# Patient Record
Sex: Female | Born: 1935 | Race: White | Hispanic: No | Smoking: Never smoker
Health system: Southern US, Community
[De-identification: ages and names within clinical notes are randomized; demographics above are authoritative.]

## PROBLEM LIST (undated history)

## (undated) DIAGNOSIS — I5032 Chronic diastolic (congestive) heart failure: Secondary | ICD-10-CM

## (undated) DIAGNOSIS — N184 Chronic kidney disease, stage 4 (severe): Secondary | ICD-10-CM

## (undated) DIAGNOSIS — J189 Pneumonia, unspecified organism: Secondary | ICD-10-CM

## (undated) DIAGNOSIS — J9600 Acute respiratory failure, unspecified whether with hypoxia or hypercapnia: Secondary | ICD-10-CM

## (undated) DIAGNOSIS — E119 Type 2 diabetes mellitus without complications: Secondary | ICD-10-CM

## (undated) DIAGNOSIS — I10 Essential (primary) hypertension: Secondary | ICD-10-CM

## (undated) DIAGNOSIS — F0390 Unspecified dementia without behavioral disturbance: Secondary | ICD-10-CM

## (undated) HISTORY — PX: THYROID SURGERY: SHX805

## (undated) HISTORY — PX: CATARACT EXTRACTION: SUR2

---

## 2003-09-04 ENCOUNTER — Other Ambulatory Visit: Payer: Self-pay

## 2004-08-07 ENCOUNTER — Ambulatory Visit: Payer: Self-pay | Admitting: Family Medicine

## 2005-12-17 ENCOUNTER — Ambulatory Visit: Payer: Self-pay | Admitting: Specialist

## 2008-05-24 ENCOUNTER — Emergency Department: Payer: Self-pay | Admitting: Emergency Medicine

## 2011-04-08 ENCOUNTER — Inpatient Hospital Stay: Payer: Self-pay | Admitting: Internal Medicine

## 2011-04-08 LAB — COMPREHENSIVE METABOLIC PANEL
Anion Gap: 15 (ref 7–16)
BUN: 43 mg/dL — ABNORMAL HIGH (ref 7–18)
Bilirubin,Total: 0.5 mg/dL (ref 0.2–1.0)
Calcium, Total: 8.6 mg/dL (ref 8.5–10.1)
Chloride: 97 mmol/L — ABNORMAL LOW (ref 98–107)
Co2: 24 mmol/L (ref 21–32)
Creatinine: 1.73 mg/dL — ABNORMAL HIGH (ref 0.60–1.30)
EGFR (African American): 36 — ABNORMAL LOW
EGFR (Non-African Amer.): 30 — ABNORMAL LOW
Potassium: 3.6 mmol/L (ref 3.5–5.1)
SGOT(AST): 13 U/L — ABNORMAL LOW (ref 15–37)
SGPT (ALT): 10 U/L — ABNORMAL LOW
Sodium: 136 mmol/L (ref 136–145)

## 2011-04-08 LAB — CBC
HCT: 33.3 % — ABNORMAL LOW (ref 35.0–47.0)
HGB: 11.4 g/dL — ABNORMAL LOW (ref 12.0–16.0)
MCH: 30.9 pg (ref 26.0–34.0)
MCV: 90 fL (ref 80–100)
RBC: 3.69 10*6/uL — ABNORMAL LOW (ref 3.80–5.20)

## 2011-04-08 LAB — URINALYSIS, COMPLETE
Glucose,UR: >=500 mg/dL (ref 0–75)
Hyaline Cast: 4
Nitrite: NEGATIVE
Ph: 5 (ref 4.5–8.0)
Protein: 100
RBC,UR: 1 /HPF (ref 0–5)
Specific Gravity: 1.013 (ref 1.003–1.030)
Squamous Epithelial: 1

## 2011-04-08 LAB — CK TOTAL AND CKMB (NOT AT ARMC): CK-MB: 1.4 ng/mL (ref 0.5–3.6)

## 2011-04-08 LAB — TROPONIN I: Troponin-I: <0.02 ng/mL

## 2011-04-08 LAB — TSH: Thyroid Stimulating Horm: 2.16 u[IU]/mL

## 2011-04-09 LAB — CBC WITH DIFFERENTIAL/PLATELET
Basophil %: 0 %
Eosinophil #: 0 10*3/uL (ref 0.0–0.7)
Eosinophil %: 0.1 %
HGB: 10.4 g/dL — ABNORMAL LOW (ref 12.0–16.0)
Lymphocyte %: 3.3 %
MCHC: 33.3 g/dL (ref 32.0–36.0)
Monocyte #: 0.9 10*3/uL — ABNORMAL HIGH (ref 0.0–0.7)
Neutrophil #: 16 10*3/uL — ABNORMAL HIGH (ref 1.4–6.5)
Neutrophil %: 91.6 %
Platelet: 180 10*3/uL (ref 150–440)
RBC: 3.43 10*6/uL — ABNORMAL LOW (ref 3.80–5.20)
WBC: 17.4 10*3/uL — ABNORMAL HIGH (ref 3.6–11.0)

## 2011-04-09 LAB — COMPREHENSIVE METABOLIC PANEL
Albumin: 1.7 g/dL — ABNORMAL LOW (ref 3.4–5.0)
Alkaline Phosphatase: 39 U/L — ABNORMAL LOW (ref 50–136)
Anion Gap: 13 (ref 7–16)
BUN: 35 mg/dL — ABNORMAL HIGH (ref 7–18)
Bilirubin,Total: 0.3 mg/dL (ref 0.2–1.0)
Creatinine: 1.3 mg/dL (ref 0.60–1.30)
Glucose: 194 mg/dL — ABNORMAL HIGH (ref 65–99)
Potassium: 3.1 mmol/L — ABNORMAL LOW (ref 3.5–5.1)
SGOT(AST): 11 U/L — ABNORMAL LOW (ref 15–37)
Sodium: 142 mmol/L (ref 136–145)
Total Protein: 6.1 g/dL — ABNORMAL LOW (ref 6.4–8.2)

## 2011-04-09 LAB — MAGNESIUM: Magnesium: 2 mg/dL

## 2011-04-10 LAB — BASIC METABOLIC PANEL
Anion Gap: 11 (ref 7–16)
Calcium, Total: 7.9 mg/dL — ABNORMAL LOW (ref 8.5–10.1)
Chloride: 105 mmol/L (ref 98–107)
Co2: 24 mmol/L (ref 21–32)
EGFR (African American): >60
EGFR (Non-African Amer.): 51 — ABNORMAL LOW
Glucose: 113 mg/dL — ABNORMAL HIGH (ref 65–99)
Osmolality: 285 (ref 275–301)
Sodium: 140 mmol/L (ref 136–145)

## 2011-04-10 LAB — CBC WITH DIFFERENTIAL/PLATELET
Eosinophil %: 0.5 %
HCT: 27.9 % — ABNORMAL LOW (ref 35.0–47.0)
Lymphocyte #: 0.8 10*3/uL — ABNORMAL LOW (ref 1.0–3.6)
MCH: 30.1 pg (ref 26.0–34.0)
MCHC: 32.9 g/dL (ref 32.0–36.0)
MCV: 92 fL (ref 80–100)
Monocyte %: 5.2 %
Neutrophil #: 16.4 10*3/uL — ABNORMAL HIGH (ref 1.4–6.5)
Platelet: 185 10*3/uL (ref 150–440)
RBC: 3.05 10*6/uL — ABNORMAL LOW (ref 3.80–5.20)
WBC: 18.2 10*3/uL — ABNORMAL HIGH (ref 3.6–11.0)

## 2011-04-10 LAB — URINE CULTURE

## 2011-04-11 LAB — BASIC METABOLIC PANEL
Anion Gap: 12 (ref 7–16)
BUN: 31 mg/dL — ABNORMAL HIGH (ref 7–18)
Creatinine: 1.38 mg/dL — ABNORMAL HIGH (ref 0.60–1.30)
EGFR (African American): 47 — ABNORMAL LOW
EGFR (Non-African Amer.): 39 — ABNORMAL LOW
Potassium: 4.3 mmol/L (ref 3.5–5.1)
Sodium: 139 mmol/L (ref 136–145)

## 2011-04-11 LAB — CBC WITH DIFFERENTIAL/PLATELET
Basophil #: 0 10*3/uL (ref 0.0–0.1)
Eosinophil %: 0 %
HGB: 10.1 g/dL — ABNORMAL LOW (ref 12.0–16.0)
Lymphocyte #: 0.4 10*3/uL — ABNORMAL LOW (ref 1.0–3.6)
MCH: 30.1 pg (ref 26.0–34.0)
MCHC: 33 g/dL (ref 32.0–36.0)
MCV: 91 fL (ref 80–100)
Monocyte #: 0.2 10*3/uL (ref 0.0–0.7)
Monocyte %: 1.2 %
Neutrophil #: 14 10*3/uL — ABNORMAL HIGH (ref 1.4–6.5)
Platelet: 208 10*3/uL (ref 150–440)
RBC: 3.36 10*6/uL — ABNORMAL LOW (ref 3.80–5.20)
RDW: 13.4 % (ref 11.5–14.5)

## 2011-04-12 LAB — CBC WITH DIFFERENTIAL/PLATELET
Basophil #: 0 10*3/uL (ref 0.0–0.1)
Eosinophil #: 0 10*3/uL (ref 0.0–0.7)
HCT: 30.9 % — ABNORMAL LOW (ref 35.0–47.0)
HGB: 10.3 g/dL — ABNORMAL LOW (ref 12.0–16.0)
Lymphocyte %: 3.2 %
MCV: 91 fL (ref 80–100)
Monocyte #: 0.4 10*3/uL (ref 0.0–0.7)
Platelet: 238 10*3/uL (ref 150–440)
RBC: 3.39 10*6/uL — ABNORMAL LOW (ref 3.80–5.20)
RDW: 13.6 % (ref 11.5–14.5)
WBC: 21.3 10*3/uL — ABNORMAL HIGH (ref 3.6–11.0)

## 2011-04-12 LAB — BASIC METABOLIC PANEL
BUN: 31 mg/dL — ABNORMAL HIGH (ref 7–18)
Calcium, Total: 8.5 mg/dL (ref 8.5–10.1)
Chloride: 112 mmol/L — ABNORMAL HIGH (ref 98–107)
Co2: 20 mmol/L — ABNORMAL LOW (ref 21–32)
Creatinine: 1.06 mg/dL (ref 0.60–1.30)
EGFR (African American): >60
Glucose: 196 mg/dL — ABNORMAL HIGH (ref 65–99)
Osmolality: 299 (ref 275–301)

## 2011-04-12 LAB — EXPECTORATED SPUTUM ASSESSMENT W GRAM STAIN, RFLX TO RESP C

## 2011-04-13 LAB — BASIC METABOLIC PANEL
Anion Gap: 12 (ref 7–16)
Calcium, Total: 8.5 mg/dL (ref 8.5–10.1)
EGFR (African American): >60
EGFR (Non-African Amer.): 52 — ABNORMAL LOW
Glucose: 343 mg/dL — ABNORMAL HIGH (ref 65–99)
Osmolality: 300 (ref 275–301)
Potassium: 4.2 mmol/L (ref 3.5–5.1)
Sodium: 140 mmol/L (ref 136–145)

## 2011-04-13 LAB — CBC WITH DIFFERENTIAL/PLATELET
Basophil %: 0 %
Eosinophil %: 0 %
HGB: 10.5 g/dL — ABNORMAL LOW (ref 12.0–16.0)
Lymphocyte #: 0.6 10*3/uL — ABNORMAL LOW (ref 1.0–3.6)
MCH: 29.6 pg (ref 26.0–34.0)
MCV: 91 fL (ref 80–100)
Monocyte #: 0.1 10*3/uL (ref 0.0–0.7)
RBC: 3.54 10*6/uL — ABNORMAL LOW (ref 3.80–5.20)
RDW: 13.7 % (ref 11.5–14.5)

## 2011-04-13 LAB — OCCULT BLOOD X 1 CARD TO LAB, STOOL: Occult Blood, Feces: NEGATIVE

## 2011-04-13 LAB — CULTURE, BLOOD (SINGLE)

## 2011-04-14 LAB — CBC WITH DIFFERENTIAL/PLATELET
Basophil #: 0 10*3/uL (ref 0.0–0.1)
Basophil %: 0.1 %
Eosinophil #: 0 10*3/uL (ref 0.0–0.7)
Lymphocyte #: 0.8 10*3/uL — ABNORMAL LOW (ref 1.0–3.6)
MCH: 29.9 pg (ref 26.0–34.0)
MCHC: 33 g/dL (ref 32.0–36.0)
MCV: 91 fL (ref 80–100)
Monocyte #: 0.5 10*3/uL (ref 0.0–0.7)
Neutrophil %: 92.6 %
Platelet: 261 10*3/uL (ref 150–440)
RBC: 3.6 10*6/uL — ABNORMAL LOW (ref 3.80–5.20)

## 2011-04-14 LAB — BASIC METABOLIC PANEL
BUN: 32 mg/dL — ABNORMAL HIGH (ref 7–18)
Calcium, Total: 8.2 mg/dL — ABNORMAL LOW (ref 8.5–10.1)
Chloride: 109 mmol/L — ABNORMAL HIGH (ref 98–107)
Co2: 24 mmol/L (ref 21–32)
Creatinine: 0.91 mg/dL (ref 0.60–1.30)
EGFR (Non-African Amer.): >60
Glucose: 243 mg/dL — ABNORMAL HIGH (ref 65–99)
Potassium: 4 mmol/L (ref 3.5–5.1)
Sodium: 143 mmol/L (ref 136–145)

## 2011-04-15 ENCOUNTER — Encounter: Payer: Self-pay | Admitting: Internal Medicine

## 2011-04-29 LAB — COMPREHENSIVE METABOLIC PANEL
Albumin: 1.7 g/dL — ABNORMAL LOW (ref 3.4–5.0)
Alkaline Phosphatase: 35 U/L — ABNORMAL LOW (ref 50–136)
BUN: 22 mg/dL — ABNORMAL HIGH (ref 7–18)
Glucose: 77 mg/dL (ref 65–99)
Potassium: 3.8 mmol/L (ref 3.5–5.1)
SGOT(AST): 17 U/L (ref 15–37)
SGPT (ALT): 19 U/L
Total Protein: 5.3 g/dL — ABNORMAL LOW (ref 6.4–8.2)

## 2011-05-09 ENCOUNTER — Encounter: Payer: Self-pay | Admitting: Internal Medicine

## 2011-06-09 ENCOUNTER — Encounter: Payer: Self-pay | Admitting: Internal Medicine

## 2011-07-09 ENCOUNTER — Encounter: Payer: Self-pay | Admitting: Internal Medicine

## 2013-05-21 ENCOUNTER — Emergency Department: Payer: Self-pay | Admitting: Emergency Medicine

## 2013-05-21 LAB — URINALYSIS, COMPLETE
Bilirubin,UR: NEGATIVE
Glucose,UR: 150 mg/dL (ref 0–75)
LEUKOCYTE ESTERASE: NEGATIVE
Nitrite: NEGATIVE
Ph: 5 (ref 4.5–8.0)
Protein: 100
SPECIFIC GRAVITY: 1.012 (ref 1.003–1.030)
WBC UR: 18 /HPF (ref 0–5)

## 2013-08-25 ENCOUNTER — Ambulatory Visit: Payer: Self-pay | Admitting: Family Medicine

## 2014-07-02 NOTE — H&P (Signed)
PATIENT NAME:  Bethany Nelson, Bethany Nelson MR#:  [id number] DATE OF BIRTH:  [birth year]  DATE OF ADMISSION:  04/08/2011  PRIMARY CARE PHYSICIAN: [name], MD  HISTORY OF PRESENT ILLNESS: The patient is an 81 year old Caucasian female with past medical history significant for history of community-acquired pneumonia diagnosed in 2001 with admission for the same, history of diabetes, hypertension, and hyperlipidemia who presented to the hospital with complaints of congestion as well as fevers for the past one week. According to patient's family, as the patient is not able to provide much history, the patient has been coughing for approximately one week now and progressively getting worse. The patient's daughter wanted to take her to a physician, however, the patient refused to go to her primary care physician. She has been having fevers anywhere from 99.2 to 101.6 since Friday, which is four days ago. She has been weak and not able to walk. Her knees buckle under her. She was brought to the emergency room because of significant and worsening weakness. In the emergency room, unfortunately, she is very weak, she is somewhat confused, and not able to remember much things and not able to provide much history. She admits of having some sputum production of yellowish-greenish phlegm over the past few days. Her oxygen saturation was noted to be 84% on room air and she requires 100% FiO2 now through nonrebreather to help her to reasonably maintain her oxygen saturation of 96%.   PAST MEDICAL HISTORY:  1. History of admission for community-acquired pneumonia in November 2001. 2. History of diabetes mellitus. 3. Hypertension, poorly controlled. 4. Medical noncompliance.  5. History of hyperlipidemia.  6. Lumbago. 7. History of hypothyroidism.  8. History of cerebrovascular accident. 9. Arthritis. 10. Chickenpox. 11. Benign dermoid cyst of left ovary and some type of cyst on the right ovary approximately four years ago.  Dermoid cyst was 40 years ago. 12. Benign thyroid growth, removed 40 years ago.  13. Depression. 14. Mild stroke, which happened three years ago.   MEDICATIONS: According to Dr. Burnett ShengHedrick. 1. HCTZ 25 mg p.o. daily.  2. Metoprolol 25 mg twice daily.  3. Diovan 320 mg p.o. daily.  4. Lexapro 10 mg p.o. daily.  5. Simvastatin 40 mg p.o. daily.  6. Metformin 1 gram twice daily. 7. Amlodipine 5 mg p.o. daily.  8. Glipizide extended-release 5 mg p.o. daily. 9. Onglyza 5 mg p.o. daily.  10. Contour test strips once or twice a day. 11. Meloxicam 7.5 mg p.o. daily as needed. 12. Levothyroxine 50 mcg p.o. daily.  ALLERGIES: No known drug allergies.   HEALTH MAINTENANCE ISSUES: The patient is not sure about her tetanus vaccine. She tries to exercise by walking.   SOCIAL HISTORY: She is widowed and has two adult children. She is retired and lives at home.   HABITS:  No alcohol or current tobacco abuse.  No illicit drugs.   OB/GYN HISTORY: G2, P2. Last menstrual period was 22 years ago, according to Dr. Burnett ShengHedrick. She has never had a mammogram. No abnormal Pap smear in the past.   FAMILY HISTORY: The patient's uncle with diabetes. The patient's mother had lung as well as esophageal cancer.   REVIEW OF SYSTEMS: Difficult to obtain as the patient is weak and not willing to cooperate much. She admits of fatigue and weakness, admits of very poor appetite, admits of having some nausea intermittently, cough with some phlegm production, yellowish and greenish sputum, some sneezing, nasal congestion, and episode of diarrhea for which she took Imodium  a few days ago, Saturday night, which was three days ago. Also very strong odor of the patient's urine. CONSTITUTIONAL: Otherwise, she denies any pain, weight loss or gain. EYES: Denies blurry vision or double vision, glaucoma, or cataracts. ENT: Denies any tinnitus, allergies, epistaxis, sinus pain, dentures, or difficulty swallowing. RESPIRATORY: Denies any  wheezes, asthma, or chronic obstructive pulmonary disease. CARDIOVASCULAR: Denies any chest pain, edema, arrhythmias, palpitations, or syncope. GASTROINTESTINAL: Denies any vomiting, hematemesis, rectal bleeding, or change in bowel habits. GENITOURINARY: Denies dysuria, hematuria, frequency, or incontinence. ENDOCRINOLOGY: Denies any polydipsia or nocturia. Admits of thyroid problems. HEMATOLOGIC:  No anemia, easy bruising, bleeding, or swollen glands. SKIN: Denies any acne lesions, lesions, or change in moles. MUSCULOSKELETAL: Denies  arthritis, cramps, swelling, or gout. Admits of having some back pain intermittently. NEUROLOGIC: Denies any numbness, epilepsy, or tremor. PSYCHIATRIC: Denies insomnia.  PHYSICAL EXAMINATION:  VITALS: On arrival to the hospital, the patient's temperature in triage was 98.2, pulse 66, respirations 22, blood pressure 146/76, and saturation 84% on room air.   GENERAL: A well-developed, well-nourished pale and very weak critically ill looking woman.   HEENT: Her pupils are equal and reactive to light. Extraocular movements are intact. No icterus or conjunctivitis.  I am not able to assess her hearing because she is not noncompliant. Mucosa is very dry. Mouth mucosa is very dry.   NECK: No masses, supple and nontender. Thyroid is not enlarged. No adenopathy, JVD or carotid bruits bilaterally. Full range of motion.  LUNGS: Clear to auscultation on the right, however, left posteriorly and lower part of lungs have diminished breath sounds as well as dullness to percussion, crackles, and rhonchi. No wheezing. No labored respirations or increased effort. Not in overt respiratory distress.   HEART: S1 and S2 appreciated. No murmurs, gallops, or rubs were noted. PMI not lateralized. Chest is nontender to palpation.   EXTREMITIES: 1+ pedal pulses. No lower extremity edema, calf tenderness, or cyanosis noted.   ABDOMEN: Soft and nontender. Bowel sounds are present. No  hepatosplenomegaly or masses were noted.   RECTAL: Deferred.   MUSCULOSKELETAL: Able to all extremities. No cyanosis, degenerative joint disease, or kyphosis. Gait is not tested.   SKIN: Skin did not reveal any rashes, lesions, erythema, nodularity, or induration. It was warm and dry to palpation.   LYMPH: No adenopathy in the cervical region.   NEUROLOGICAL: Cranial nerves grossly intact. Sensory is intact. No dysarthria or aphasia. The patient is alert and oriented to person and place however not to time. She is poorly cooperative. Her memory is severely impaired. She is not able to answer any questions.   LABS/STUDIES: Her EKG showed normal sinus rhythm at 67 beats per minute, normal axis. Low threshold for left ventricular hypertrophy. No acute ST-T changes were noted.   Glucose is 359 and BUN and creatinine were 43 and 1.73. The patient's creatinine in the past, in March 2010, was 1.06 which makes estimated GFR for non-African American 30. The patient's liver enzymes showed a bilirubin level of 2.2, otherwise unremarkable study. The patient's white blood cell count is elevated to 15.8, hemoglobin was 11.4, and platelets 195. Influenza test was negative.   Urinalysis is pending.   Chest x-ray, portable single view, on 04/08/2011, revealed right lower lobe pneumonia.   ASSESSMENT AND PLAN:  1. Acute respiratory failure: Admit the patient to the medical floor, continue nonrebreather keeping her pulse oximetry around 92%, continue antibiotic therapy and follow her. 2. Right lower lobe bacterial pneumonia: Start the patient  on Levaquin adjusted for renal insufficiency, also Rocephin. Get sputum cultures as well as blood cultures, follow and adjust antibiotics according to culture results. May need CT scan because of her severe hypoxia. She also will need to be followed up for pneumonia to rule out a mass.  3. Acute renal failure, questionable acute on chronic renal failure: Continue the  patient on IV fluids, hold Diovan as well as HCTZ, follow the patient's creatinine levels, and get urinalysis stat. Get cultures if urinalysis looks abnormal.  4. Hypoalbuminemia, concerning for malnutrition: Get prealbumin as well as dietitian to follow her oral intake.  5. Anemia: Get guaiac.  6. Leukocytosis: Followup with IV fluids and antibiotic therapy.  7. Generalized weakness: We will get physical therapist, questionable rehab placement. 8. Diabetes mellitus: We will continue glipizide as well as sliding scale insulin. We will get hemoglobin A1c. 9. Hypertension: We will continue home medications, however, we will hold Diovan as well as HCTZ due to renal failure.  10. Hypothyroidism: Continue Synthroid and check TSH.   TIME SPENT: One hour.  ____________________________ Katharina Caper, MD rv:slb D: 04/08/2011 12:49:27 ET T: 04/08/2011 13:26:38 ET JOB#: [id number]  cc: Katharina Caper, MD, <Dictator> Rhona Leavens. Burnett Sheng, MD Katharina Caper MD ELECTRONICALLY SIGNED 04/20/2011 20:25

## 2014-07-02 NOTE — Consult Note (Signed)
PATIENT NAME:  Bethany Nelson, Bethany Nelson MR#:  [id number] DATE OF BIRTH:  09/10/1926  DATE OF CONSULTATION:  04/11/2011  REFERRING PHYSICIAN:  Deanne Coffer, MD CONSULTING PHYSICIAN:  A. Lavone Orn, MD  CHIEF COMPLAINT: Uncontrolled diabetes.   HISTORY OF PRESENT ILLNESS: This is an 81 year old female who was admitted yesterday with acute respiratory failure attributed to a right lower lobe pneumonia. She was found to have an initial oxygen saturation of 84%. She was placed on IV antibiotics as well as IV steroids. Her steroids have been adjusted and today she is to receive methylprednisolone 80 mg every eight hours. Her presenting blood sugar was 359 and with initiation of steroids, blood sugars increased into the 500s. She was then started earlier today on an IV insulin drip and sugars remain in the 400s.   Patient is well known to me from clinic. She has had type 2 diabetes for about 10 years. Her hemoglobin A1c has been in the 6 to 7% range. Her outpatient regimen includes metformin 1000 mg twice daily, Onglyza 5 mg daily and glipizide 5 mg daily. A hemoglobin A1c obtained here on 04/08/2011 was 8.5%. Her current rate of IV insulin is 6 units/hour.   PAST MEDICAL HISTORY:  1. Type 2 diabetes.  2. Microalbuminuria.  3. Hypertension.  4. Hyperlipidemia.  5. Osteoarthritis.  6. History of cerebrovascular accident.  7. Hypothyroidism.   OUTPATIENT MEDICATIONS:  1. Onglyza 5 mg daily.  2. Glipizide 5 mg daily.  3. Diovan 320 mg daily.  4. Macrobid 100 mg b.i.d.  5. Amlodipine 5 mg daily.  6. HCTZ 25 mg daily.  7. Metoprolol 25 mg b.i.d.  8. Plavix 75 mg daily.  9. Metformin 1000 mg b.i.d.  10. Levothyroxine 50 mcg daily.  11. Simvastatin 40 mg daily.  SOCIAL HISTORY: Patient is a widow. She has a daughter who lives in [place]. She does not smoke or drink alcohol.   FAMILY HISTORY: One uncle had diabetes. Mother had esophageal cancer.   ALLERGIES: No known drug allergies.   REVIEW OF  SYSTEMS: HEENT: No blurred vision. No sore throat. She does report dry mouth. NECK: No neck pain. No dysphasia. CARDIAC: No chest pain. No palpitations. PULMONARY: Shortness of breath has improved. She has a cough. ABDOMEN: Denies abdominal pain. No nausea. Appetite is fine. No recent change of bowel habits. EXTREMITIES: Denies leg swelling. SKIN: Denies rash or pruritus. ENDO: Denies heat or cold intolerance. HEME: Admits to easy bruisability. Denies recent bleeding.   PHYSICAL EXAMINATION:  VITAL SIGNS: Temperature 97.5, pulse 73, respiratory rate 18, blood pressure 159/82, pulse oximetry 95% on 4 liters O2 by nasal cannula.   GENERAL: Well-developed, well-nourished white female in no distress.   HEENT: Extraocular movements are intact. Oropharynx is clear. Mucous membranes moist. Eyes: Pupils are equal, round, and reactive to light. No conjunctival injection.   NECK: Supple.   CARDIAC: Regular rate and rhythm without murmur.   PULMONARY: Crackles bilaterally. Good inspiratory effort.   ABDOMEN: Diffusely soft, nontender, nondistended. Normal abdominal bowel sounds.   EXTREMITIES: No edema is present.   SKIN: No rash or dermatopathy is present.   LYMPH: No submandibular or anterior cervical lymphadenopathy is present.   PSYCHIATRIC: Alert and oriented x3, calm and cooperative.   LABORATORY, DIAGNOSTIC AND RADIOLOGICAL DATA: Glucose 480, BUN 31, creatinine 1.38, sodium 139, potassium 4.3, eGFR 39, calcium 8.4, AST 11, ALT 9, albumin 1.7, hemoglobin 10.1, hematocrit 30.7, WBC 14.5. Urinalysis on 01/29 showed greater than 100,000 CFU Escherichia coli.  ASSESSMENT: An 81 year old female with history of well controlled diabetes, currently with severely uncontrolled diabetes due in part to infection, holding out-patient diabetes medications, and likely exacerbated by high dose glucocorticoids. She has persistently elevated blood sugars despite IV insulin drip.   RECOMMENDATIONS:   1. Recommend taper of steroids as soon as possible as this will greatly improve her glycemic control.  2. Will give Lantus 20 units now and would continue to give daily as long as she is receiving glucocorticoids.  3. Despite maximum drip rate of 6 units an hour of regular insulin, sugars are over 400. I will adjust her drip slightly to allow for a higher drip rate and hopefully improve this glycemic control.   4. Will modify diet slightly to cut down on carb intake and hopefully also improve the blood sugars.   I will be unavailable over the weekend, however, if she remains inhouse on Monday, 04/14/11, I will see her then. Thank you for the kind request for consultation.  ____________________________ A. Lavone Orn, MD ams:cms D: 04/11/2011 14:33:35 ET T: 04/11/2011 14:57:45 ET JOB#: [id number]  cc: A. Lavone Orn, MD, <Dictator> Sherlon Handing MD ELECTRONICALLY SIGNED 04/14/2011 17:15

## 2014-07-02 NOTE — Discharge Summary (Signed)
PATIENT NAME:  Bethany Nelson, Bethany Nelson MR#:  [id number] DATE OF BIRTH:  Dec 15, 1926  DATE OF ADMISSION:  04/08/2011 DATE OF DISCHARGE:  04/15/2011  ADMITTING DIAGNOSIS: Shortness of breath, cough, fevers.   DISCHARGE DIAGNOSES:  1. Shortness of breath, cough, fevers due to community-acquired pneumonia, right lower lobe, status post treatment with 7 days of Rocephin and 6 days of azithromycin.  2. Acute respiratory failure due to pneumonia as well as bronchospasms, improved with steroids and nebulizers.  3. Acute renal failure, resolved with IV fluids and holding of her hydrochlorothiazide.  4. Anemia, likely due to anemia of chronic disease.  5. Generalized weakness due to infection and deconditioning. The patient will be discharged to rehab.  6. Diabetes with blood glucose very poorly controlled during hospitalization due to IV fluids and steroids. Status post evaluation by Endocrinology, currently on Lantus and glipizide and metformin. The patient's blood sugars will be monitored at the facility. If they start trending downwards after steroids are decreased and discontinued, she may need to be taken off Lantus.  7. Accelerated hypertension. The patient's blood pressure is stable on the current regimen.  8. Hypothyroidism.  9. Hypokalemia, status post replacement.  10. Depression.  11. Hypoalbuminemia, likely due to caloric protein malnutrition.  12. Urinary tract infection due to Escherichia coli, status post treatment with ceftriaxone.  13. History of medication noncompliance.  14. Hyperlipidemia.  15. History of cerebrovascular accident.  16. Osteoarthritis.  17. Chicken pox. 18. Benign dermoid cyst in the left ovary.  19. Benign thyroid growth removed 40 years ago.   CONSULTANTS: Endocrinology, Dr. Tedd SiasSolum.   PERTINENT LABORATORY, DIAGNOSTIC AND RADIOLOGICAL DATA:  Admitting glucose was 359, BUN 43, creatinine 1.73, sodium was 136, potassium 3.6, chloride 97, CO2 was 24, calcium was 8.6.   LFTs showed a total protein of 7.0, albumin 2.2. AST and ALT were normal.  Troponin less than 0.02. TSH 2.16  WBC count 15.8, hemoglobin 11.4.  Influenza A and B were negative.  Blood cultures x2 no growth.  Urinalysis showed 3+ bacteria.  WBCs were 12.  Chest x-ray done on the 3rd shows right lower lobe pneumonia. Recommend follow-up radiograph to document resolution.   HOSPITAL COURSE: Please see History and Physical done by the admitting physician. The patient is a pleasant 81 year old white female with a history of pneumonia in the past, diabetes, hypertension, hyperlipidemia which are poorly controlled, presented with complaint of shortness of breath, fevers for the past few weeks. She was seen in the ED and noted to be hypoxic. She had a chest x-ray which showed a right lower lobe pneumonia. She was started on antibiotics with ceftriaxone and azithromycin. She also was noted to have bronchospasm and acute respiratory failure and was treated with nebulizers, Solu-Medrol. With these treatments, her respiratory status has improved significantly. She is currently off any oxygen. Shortness of breath is resolved. The patient has been treated with 7 days of Rocephin and 6 days of azithromycin which should suffice for treatment of pneumonia. The patient was also noted to have a urinary tract infection which was also treated with ceftriaxone. The patient is also noted to have acute renal failure on admission, was given IV fluids. Her HCTZ was discontinued, and her renal function has now normalized. In light of her having steroids as well as poorly controlled diabetes prior to hospitalization, her sugars were significantly elevated, and she had to be placed on insulin drip. She was followed by Endocrinology and they adjusted her regimen. The patient does have  generalized weakness and is very deconditioned, so she will be discharged to a rehab facility.   DISCHARGE MEDICATIONS:  1. Metoprolol 25 mg, 1 tab  p.o. b.i.d.  2. Metformin 1000 mg b.i.d.  3. Levothyroxine 50 mcg, 1 tab p.o. daily.  4. Amlodipine 5 mg daily.  5. Simvastatin 40 mg daily.  6. Lantus 20 units at bedtime.  7. Sliding scale insulin per [facility]. 8. Prednisone taper 40 mg x2 days, 20 mg x2 days, 10 mg x2 days, then stop.  9. Combivent metered-dose inhaler, 2 to 4 puffs every 6 hours p.r.n. shortness of breath.  10. Aspirin 81 mg, 1 tab p.o. daily.  11. Tylenol 650 mg every 6 hours p.r.n. pain. 12. Lexapro 10 mg daily.  13. Glucotrol XL 5 mg daily.   DIET: Low sodium.   ACTIVITY: As tolerated.   REFERRAL: To rehab. Physical Therapy evaluation and treatment at the rehab.   TIMEFRAME FOR FOLLOW UP: 1 to 2 weeks with Dr. Burnett Sheng.   TIME SPENT ON DISCHARGE: 35 minutes.  ____________________________ Bethany Nelson Allena Katz, MD shp:cbb D: 04/15/2011 10:55:54 ET T: 04/15/2011 11:06:14 ET JOB#: [id number]  cc: Bethany Nelson H. Allena Katz, MD, <Dictator>  Bethany Nelson. Burnett Sheng, MD Charise Carwin MD ELECTRONICALLY SIGNED 04/26/2011 10:01

## 2016-02-21 ENCOUNTER — Ambulatory Visit (HOSPITAL_COMMUNITY)
Admission: EM | Admit: 2016-02-21 | Discharge: 2016-02-21 | Disposition: A | Discharge status: Other Acute Inpatient Hospital | Payer: Medicare PPO | Source: Home / Self Care | Attending: Emergency Medicine | Admitting: Emergency Medicine

## 2016-02-21 ENCOUNTER — Ambulatory Visit (INDEPENDENT_AMBULATORY_CARE_PROVIDER_SITE_OTHER): Payer: Medicare PPO

## 2016-02-21 ENCOUNTER — Encounter (HOSPITAL_COMMUNITY): Payer: Self-pay | Admitting: Emergency Medicine

## 2016-02-21 ENCOUNTER — Inpatient Hospital Stay (HOSPITAL_COMMUNITY)
Admission: EM | Admit: 2016-02-21 | Discharge: 2016-02-25 | DRG: 291 | Disposition: A | Discharge status: Home Health | Payer: Medicare PPO | Attending: Internal Medicine | Admitting: Internal Medicine

## 2016-02-21 DIAGNOSIS — E039 Hypothyroidism, unspecified: Secondary | ICD-10-CM | POA: Diagnosis present

## 2016-02-21 DIAGNOSIS — Z7982 Long term (current) use of aspirin: Secondary | ICD-10-CM | POA: Diagnosis not present

## 2016-02-21 DIAGNOSIS — R0603 Acute respiratory distress: Secondary | ICD-10-CM

## 2016-02-21 DIAGNOSIS — I509 Heart failure, unspecified: Secondary | ICD-10-CM

## 2016-02-21 DIAGNOSIS — I5031 Acute diastolic (congestive) heart failure: Secondary | ICD-10-CM | POA: Diagnosis present

## 2016-02-21 DIAGNOSIS — D696 Thrombocytopenia, unspecified: Secondary | ICD-10-CM | POA: Diagnosis present

## 2016-02-21 DIAGNOSIS — I10 Essential (primary) hypertension: Secondary | ICD-10-CM | POA: Diagnosis not present

## 2016-02-21 DIAGNOSIS — I130 Hypertensive heart and chronic kidney disease with heart failure and stage 1 through stage 4 chronic kidney disease, or unspecified chronic kidney disease: Secondary | ICD-10-CM | POA: Diagnosis not present

## 2016-02-21 DIAGNOSIS — E876 Hypokalemia: Secondary | ICD-10-CM | POA: Diagnosis present

## 2016-02-21 DIAGNOSIS — E785 Hyperlipidemia, unspecified: Secondary | ICD-10-CM | POA: Diagnosis present

## 2016-02-21 DIAGNOSIS — N183 Chronic kidney disease, stage 3 unspecified: Secondary | ICD-10-CM

## 2016-02-21 DIAGNOSIS — F0390 Unspecified dementia without behavioral disturbance: Secondary | ICD-10-CM | POA: Diagnosis present

## 2016-02-21 DIAGNOSIS — I5020 Unspecified systolic (congestive) heart failure: Secondary | ICD-10-CM

## 2016-02-21 DIAGNOSIS — I248 Other forms of acute ischemic heart disease: Secondary | ICD-10-CM | POA: Diagnosis present

## 2016-02-21 DIAGNOSIS — E1122 Type 2 diabetes mellitus with diabetic chronic kidney disease: Secondary | ICD-10-CM

## 2016-02-21 DIAGNOSIS — Z79899 Other long term (current) drug therapy: Secondary | ICD-10-CM | POA: Diagnosis not present

## 2016-02-21 DIAGNOSIS — E11649 Type 2 diabetes mellitus with hypoglycemia without coma: Secondary | ICD-10-CM | POA: Diagnosis present

## 2016-02-21 DIAGNOSIS — D649 Anemia, unspecified: Secondary | ICD-10-CM | POA: Diagnosis present

## 2016-02-21 DIAGNOSIS — Z794 Long term (current) use of insulin: Secondary | ICD-10-CM

## 2016-02-21 DIAGNOSIS — N184 Chronic kidney disease, stage 4 (severe): Secondary | ICD-10-CM | POA: Diagnosis present

## 2016-02-21 HISTORY — DX: Essential (primary) hypertension: I10

## 2016-02-21 HISTORY — DX: Type 2 diabetes mellitus without complications: E11.9

## 2016-02-21 LAB — COMPREHENSIVE METABOLIC PANEL
ALBUMIN: 3 g/dL — AB (ref 3.5–5.0)
ALK PHOS: 48 U/L (ref 38–126)
ALT: 19 U/L (ref 14–54)
AST: 27 U/L (ref 15–41)
Anion gap: 7 (ref 5–15)
BILIRUBIN TOTAL: 0.7 mg/dL (ref 0.3–1.2)
BUN: 35 mg/dL — AB (ref 6–20)
CO2: 28 mmol/L (ref 22–32)
Calcium: 9 mg/dL (ref 8.9–10.3)
Chloride: 106 mmol/L (ref 101–111)
Creatinine, Ser: 1.54 mg/dL — ABNORMAL HIGH (ref 0.44–1.00)
GFR calc Af Amer: 34 mL/min — ABNORMAL LOW (ref >60)
GFR calc non Af Amer: 29 mL/min — ABNORMAL LOW (ref >60)
GLUCOSE: 94 mg/dL (ref 65–99)
POTASSIUM: 3.6 mmol/L (ref 3.5–5.1)
SODIUM: 141 mmol/L (ref 135–145)
TOTAL PROTEIN: 6.3 g/dL — AB (ref 6.5–8.1)

## 2016-02-21 LAB — CBC WITH DIFFERENTIAL/PLATELET
BASOS ABS: 0 10*3/uL (ref 0.0–0.1)
BASOS PCT: 0 %
EOS ABS: 0.1 10*3/uL (ref 0.0–0.7)
Eosinophils Relative: 1 %
HEMATOCRIT: 36.4 % (ref 36.0–46.0)
HEMOGLOBIN: 11.5 g/dL — AB (ref 12.0–15.0)
Lymphocytes Relative: 13 %
Lymphs Abs: 1.3 10*3/uL (ref 0.7–4.0)
MCH: 30.9 pg (ref 26.0–34.0)
MCHC: 31.6 g/dL (ref 30.0–36.0)
MCV: 97.8 fL (ref 78.0–100.0)
Monocytes Absolute: 0.6 10*3/uL (ref 0.1–1.0)
Monocytes Relative: 6 %
NEUTROS ABS: 7.8 10*3/uL — AB (ref 1.7–7.7)
NEUTROS PCT: 80 %
Platelets: 137 10*3/uL — ABNORMAL LOW (ref 150–400)
RBC: 3.72 MIL/uL — ABNORMAL LOW (ref 3.87–5.11)
RDW: 14.6 % (ref 11.5–15.5)
WBC: 9.8 10*3/uL (ref 4.0–10.5)

## 2016-02-21 LAB — I-STAT CHEM 8, ED
BUN: 36 mg/dL — ABNORMAL HIGH (ref 6–20)
Calcium, Ion: 1.09 mmol/L — ABNORMAL LOW (ref 1.15–1.40)
Chloride: 105 mmol/L (ref 101–111)
Creatinine, Ser: 1.4 mg/dL — ABNORMAL HIGH (ref 0.44–1.00)
Glucose, Bld: 86 mg/dL (ref 65–99)
HEMATOCRIT: 33 % — AB (ref 36.0–46.0)
HEMOGLOBIN: 11.2 g/dL — AB (ref 12.0–15.0)
POTASSIUM: 3.7 mmol/L (ref 3.5–5.1)
SODIUM: 141 mmol/L (ref 135–145)
TCO2: 26 mmol/L (ref 0–100)

## 2016-02-21 LAB — I-STAT TROPONIN, ED: Troponin i, poc: 0.19 ng/mL (ref 0.00–0.08)

## 2016-02-21 LAB — BRAIN NATRIURETIC PEPTIDE: B NATRIURETIC PEPTIDE 5: 749.8 pg/mL — AB (ref 0.0–100.0)

## 2016-02-21 MED ORDER — SODIUM CHLORIDE 0.9% FLUSH
3.0000 mL | Freq: Two times a day (BID) | INTRAVENOUS | Status: DC
  Administered 2016-02-22 – 2016-02-25 (×7): 3 mL via INTRAVENOUS

## 2016-02-21 MED ORDER — IRBESARTAN 300 MG PO TABS: 300.0000 mg | ORAL_TABLET | Freq: Every day | ORAL | Status: DC

## 2016-02-21 MED ORDER — HYDROCHLOROTHIAZIDE 25 MG PO TABS
25.0000 mg | ORAL_TABLET | Freq: Every day | ORAL | Status: DC
  Administered 2016-02-22: 25 mg via ORAL
  Filled 2016-02-21: qty 1

## 2016-02-21 MED ORDER — LEVOTHYROXINE SODIUM 75 MCG PO TABS
75.0000 ug | ORAL_TABLET | Freq: Every day | ORAL | Status: DC
  Administered 2016-02-22 – 2016-02-25 (×4): 75 ug via ORAL
  Filled 2016-02-21 (×4): qty 1

## 2016-02-21 MED ORDER — FUROSEMIDE 10 MG/ML IJ SOLN
40.0000 mg | Freq: Once | INTRAMUSCULAR | Status: AC
Start: 2016-02-21 — End: 2016-02-21
  Administered 2016-02-21: 40 mg via INTRAVENOUS
  Filled 2016-02-21: qty 4

## 2016-02-21 MED ORDER — FUROSEMIDE 10 MG/ML IJ SOLN
40.0000 mg | Freq: Every day | INTRAMUSCULAR | Status: DC
  Administered 2016-02-22 – 2016-02-23 (×2): 40 mg via INTRAVENOUS
  Filled 2016-02-21 (×2): qty 4

## 2016-02-21 MED ORDER — DORZOLAMIDE HCL-TIMOLOL MAL 2-0.5 % OP SOLN
1.0000 [drp] | Freq: Two times a day (BID) | OPHTHALMIC | Status: DC
  Filled 2016-02-21: qty 10

## 2016-02-21 MED ORDER — FLUTICASONE PROPIONATE 50 MCG/ACT NA SUSP
1.0000 | Freq: Every day | NASAL | Status: DC
  Administered 2016-02-22 – 2016-02-25 (×4): 1 via NASAL
  Filled 2016-02-21 (×3): qty 16

## 2016-02-21 MED ORDER — ONDANSETRON HCL 4 MG/2ML IJ SOLN: 4.0000 mg | Freq: Four times a day (QID) | INTRAMUSCULAR | Status: DC | PRN

## 2016-02-21 MED ORDER — IRBESARTAN 300 MG PO TABS
300.0000 mg | ORAL_TABLET | Freq: Every day | ORAL | Status: DC
  Administered 2016-02-22 – 2016-02-25 (×4): 300 mg via ORAL
  Filled 2016-02-21 (×4): qty 1

## 2016-02-21 MED ORDER — ENOXAPARIN SODIUM 40 MG/0.4ML ~~LOC~~ SOLN
40.0000 mg | Freq: Every day | SUBCUTANEOUS | Status: DC
  Administered 2016-02-22 – 2016-02-24 (×3): 40 mg via SUBCUTANEOUS
  Filled 2016-02-21 (×3): qty 0.4

## 2016-02-21 MED ORDER — ASPIRIN EC 81 MG PO TBEC
81.0000 mg | DELAYED_RELEASE_TABLET | ORAL | Status: DC
  Administered 2016-02-22 – 2016-02-24 (×2): 81 mg via ORAL
  Filled 2016-02-21 (×2): qty 1

## 2016-02-21 MED ORDER — SODIUM CHLORIDE 0.9 % IV SOLN: 250.0000 mL | INTRAVENOUS | Status: DC | PRN

## 2016-02-21 MED ORDER — ACETAMINOPHEN 325 MG PO TABS: 650.0000 mg | ORAL_TABLET | ORAL | Status: DC | PRN

## 2016-02-21 MED ORDER — ATORVASTATIN CALCIUM 40 MG PO TABS
40.0000 mg | ORAL_TABLET | Freq: Every day | ORAL | Status: DC
  Administered 2016-02-22 – 2016-02-25 (×4): 40 mg via ORAL
  Filled 2016-02-21 (×4): qty 1

## 2016-02-21 MED ORDER — INSULIN GLARGINE 100 UNIT/ML ~~LOC~~ SOLN
22.0000 [IU] | Freq: Every day | SUBCUTANEOUS | Status: DC
  Administered 2016-02-22: 22 [IU] via SUBCUTANEOUS
  Filled 2016-02-21: qty 0.22

## 2016-02-21 MED ORDER — SODIUM CHLORIDE 0.9% FLUSH
3.0000 mL | INTRAVENOUS | Status: DC | PRN
  Administered 2016-02-22 – 2016-02-23 (×2): 3 mL via INTRAVENOUS
  Filled 2016-02-21: qty 3

## 2016-02-21 MED ORDER — METOPROLOL TARTRATE 25 MG PO TABS
25.0000 mg | ORAL_TABLET | Freq: Two times a day (BID) | ORAL | Status: DC
  Administered 2016-02-22 – 2016-02-25 (×8): 25 mg via ORAL
  Filled 2016-02-21 (×8): qty 1

## 2016-02-21 MED ORDER — SODIUM CHLORIDE 0.9 % IV SOLN: Freq: Once | INTRAVENOUS | Status: DC

## 2016-02-21 MED ORDER — LATANOPROST 0.005 % OP SOLN
1.0000 [drp] | Freq: Every day | OPHTHALMIC | Status: DC
  Administered 2016-02-22 – 2016-02-24 (×4): 1 [drp] via OPHTHALMIC
  Filled 2016-02-21: qty 2.5

## 2016-02-21 MED ORDER — DONEPEZIL HCL 5 MG PO TABS
5.0000 mg | ORAL_TABLET | Freq: Every day | ORAL | Status: DC
  Administered 2016-02-22 – 2016-02-24 (×4): 5 mg via ORAL
  Filled 2016-02-21 (×4): qty 1

## 2016-02-21 MED ORDER — METOPROLOL TARTRATE 25 MG PO TABS: 25.0000 mg | ORAL_TABLET | Freq: Two times a day (BID) | ORAL | Status: DC

## 2016-02-21 NOTE — ED Notes (Signed)
Report phoned To  Clide Cliffhris  rn  Charge  Nurse    Er

## 2016-02-21 NOTE — ED Provider Notes (Signed)
[facility]-EMERGENCY DEPT Provider Note   CSN: [id number] Arrival date & time: 02/21/16  2055     History   Chief Complaint Chief Complaint  Patient presents with  . Congestive Heart Failure    HPI Bethany Nelson is a 82 y.o. female.  Patient complains of shortness of breath for 3 or 4 days and some swelling in her ankles.   The history is provided by the patient. No language interpreter was used.  Congestive Heart Failure  This is a new problem. The current episode started more than 2 days ago. The problem occurs constantly. The problem has not changed since onset.Pertinent negatives include no chest pain, no abdominal pain and no headaches. Nothing aggravates the symptoms. Nothing relieves the symptoms.    Past Medical History:  Diagnosis Date  . Diabetes mellitus without complication (HCC)   . Hypertension     There are no active problems to display for this patient.   History reviewed. No pertinent surgical history.  OB History    No data available       Home Medications    Prior to Admission medications   Medication Sig Start Date End Date Taking? Authorizing Provider  aspirin EC 81 MG tablet Take 81 mg by mouth every other day.   Yes Historical Provider, MD  atorvastatin (LIPITOR) 40 MG tablet Take 40 mg by mouth daily. 01/08/16  Yes Historical Provider, MD  cetirizine (ZYRTEC) 10 MG tablet Take 10 mg by mouth daily as needed for allergies.   Yes Historical Provider, MD  donepezil (ARICEPT) 5 MG tablet Take 5 mg by mouth at bedtime. 01/08/16  Yes Historical Provider, MD  dorzolamide-timolol (COSOPT) 22.3-6.8 MG/ML ophthalmic solution Place 1 drop into both eyes 2 (two) times daily. 12/16/15  Yes Historical Provider, MD  fluticasone (FLONASE) 50 MCG/ACT nasal spray Place 1 spray into both nostrils daily. 02/17/16  Yes Historical Provider, MD  hydrochlorothiazide (HYDRODIURIL) 25 MG tablet Take 25 mg by mouth daily. 01/08/16  Yes Historical Provider, MD  LANTUS  SOLOSTAR 100 UNIT/ML Solostar Pen Inject 22 Units into the skin every morning. 01/09/16  Yes Historical Provider, MD  latanoprost (XALATAN) 0.005 % ophthalmic solution Place 1 drop into both eyes at bedtime. 02/15/16  Yes Historical Provider, MD  levothyroxine (SYNTHROID, LEVOTHROID) 75 MCG tablet Take 75 mcg by mouth daily before breakfast. 01/08/16  Yes Historical Provider, MD  metoprolol tartrate (LOPRESSOR) 25 MG tablet Take 25 mg by mouth 2 (two) times daily. 01/09/16  Yes Historical Provider, MD  pioglitazone (ACTOS) 15 MG tablet Take 15 mg by mouth daily. 01/08/16  Yes Historical Provider, MD  valsartan (DIOVAN) 320 MG tablet Take 320 mg by mouth daily. 01/08/16  Yes Historical Provider, MD    Family History History reviewed. No pertinent family history.  Social History Social History  Substance Use Topics  . Smoking status: Never Smoker  . Smokeless tobacco: Never Used  . Alcohol use No     Allergies   Patient has no known allergies.   Review of Systems Review of Systems  Constitutional: Negative for appetite change and fatigue.  HENT: Negative for congestion, ear discharge and sinus pressure.   Eyes: Negative for discharge.  Respiratory: Negative for cough.   Cardiovascular: Negative for chest pain.  Gastrointestinal: Negative for abdominal pain and diarrhea.  Genitourinary: Negative for frequency and hematuria.  Musculoskeletal: Negative for back pain.  Skin: Negative for rash.  Neurological: Negative for seizures and headaches.  Psychiatric/Behavioral: Negative for hallucinations.  Physical Exam Updated Vital Signs BP 166/60 (BP Location: Left Arm)   Pulse 69   Temp 98.3 F (36.8 C) (Oral)   Resp 20   SpO2 97%   Physical Exam  Constitutional: She is oriented to person, place, and time. She appears well-developed.  HENT:  Head: Normocephalic.  Eyes: Conjunctivae and EOM are normal. No scleral icterus.  Neck: Neck supple. No thyromegaly present.    Cardiovascular: Normal rate and regular rhythm.  Exam reveals no gallop and no friction rub.   No murmur heard. Pulmonary/Chest: No stridor. She has wheezes. She has no rales. She exhibits no tenderness.  Abdominal: She exhibits no distension. There is no tenderness. There is no rebound.  Musculoskeletal: Normal range of motion. She exhibits no edema.  Lymphadenopathy:    She has no cervical adenopathy.  Neurological: She is oriented to person, place, and time. She exhibits normal muscle tone. Coordination normal.  Skin: No rash noted. No erythema.  Psychiatric: She has a normal mood and affect. Her behavior is normal.     ED Treatments / Results  Labs (all labs ordered are listed, but only abnormal results are displayed) Labs Reviewed  I-STAT CHEM 8, ED - Abnormal; Notable for the following:       Result Value   BUN 36 (*)    Creatinine, Ser 1.40 (*)    Calcium, Ion 1.09 (*)    Hemoglobin 11.2 (*)    HCT 33.0 (*)    All other components within normal limits  BRAIN NATRIURETIC PEPTIDE  CBC WITH DIFFERENTIAL/PLATELET  COMPREHENSIVE METABOLIC PANEL  I-STAT TROPOININ, ED    EKG  EKG Interpretation None       Radiology Dg Chest 2 View  Result Date: 02/21/2016 CLINICAL DATA:  Initial evaluation for acute dyspnea, peripheral edema, teeth kidney a. EXAM: CHEST  2 VIEW COMPARISON:  None available. FINDINGS: Mild to moderate cardiomegaly. Mediastinal silhouette within normal limits. Extensive atherosclerotic plaque within the aorta. Lungs are normally inflated. Perihilar vascular congestion with diffuse interstitial prominence. Few scattered Kerley B-lines present at the lung bases. Findings compatible pulmonary interstitial edema. Small bilateral pleural effusions. No focal infiltrates. No pneumothorax. Compression deformity at the T12 vertebral body, favored to be chronic. No definite acute osseous abnormality. Bones are diffusely osteopenia. IMPRESSION: 1. Cardiomegaly with mild  diffuse pulmonary interstitial edema and small bilateral pleural effusions, consistent with acute CHF exacerbation. 2. Extensive atherosclerosis. 3. Wedge deformity of the T12 vertebral body, favored to be chronic in nature. Correlation with history and physical exam recommended. Electronically Signed   By: Rise MuBenjamin  McClintock M.D.   On: 02/21/2016 19:37    Procedures Procedures (including critical care time)  Medications Ordered in ED Medications  furosemide (LASIX) injection 40 mg (40 mg Intravenous Given 02/21/16 2241)     Initial Impression / Assessment and Plan / ED Course  I have reviewed the triage vital signs and the nursing notes.  Pertinent labs & imaging results that were available during my care of the patient were reviewed by me and considered in my medical decision making (see chart for details).  Clinical Course    Chf,  admit  Final Clinical Impressions(s) / ED Diagnoses   Final diagnoses:  None    New Prescriptions New Prescriptions   No medications on file     Bethann BerkshireJoseph Kreed Kauffman, MD 02/21/16 2304

## 2016-02-21 NOTE — ED Triage Notes (Signed)
Per EMS:  [facility] diagnosis [facility].  Pt has been experiencing cold-like symptoms x 3 days with sniffles.  Pt has a hx of pedal edema, and pt is poor historian, so medical history/medications are not complete.  Pt has been experiencing shortness of breath with a DG Chest stating fluid in lungs.

## 2016-02-21 NOTE — ED Triage Notes (Signed)
Pt/daughter stated,  She's had congestion since Sunday and now its in here upper chest.

## 2016-02-21 NOTE — ED Notes (Signed)
Bethany Nelson  Could  Not  Obtain    Iv     Ems  On  Scene

## 2016-02-21 NOTE — ED Provider Notes (Signed)
CSN: [id number]     Arrival date & time 02/21/16  1814 History   First MD Initiated Contact with Patient 02/21/16 1901     Chief Complaint  Patient presents with  . Nasal Congestion  . Wheezing   (Consider location/radiation/quality/duration/timing/severity/associated sxs/prior Treatment) 82 year old female brought in by the daughter has a history of diabetes mellitus and hypertension and lower extremity edema. Her chief complaint today is nasal congestion and dyspnea. Her daughter states that she was concerned about her wheezing. She does not have a history of asthma, COPD or smoking.      Past Medical History:  Diagnosis Date  . Diabetes mellitus without complication (HCC)   . Hypertension    History reviewed. No pertinent surgical history. No family history on file. Social History  Substance Use Topics  . Smoking status: Never Smoker  . Smokeless tobacco: Never Used  . Alcohol use No   OB History    No data available     Review of Systems  Constitutional: Positive for activity change. Negative for fatigue and fever.  HENT: Positive for postnasal drip and rhinorrhea. Negative for ear pain.   Eyes: Negative.   Respiratory: Positive for shortness of breath and wheezing.   Cardiovascular: Positive for leg swelling.  Gastrointestinal: Negative.   Musculoskeletal: Negative.   All other systems reviewed and are negative.   Allergies  Patient has no allergy information on record.  Home Medications   Prior to Admission medications   Not on File   Meds Ordered and Administered this Visit  Medications - No data to display  BP 179/56 (BP Location: Left Arm)   Pulse 69   Temp 98.1 F (36.7 C) (Oral)   Resp (!) 33   SpO2 95%  No data found.   Physical Exam  Constitutional: She appears well-developed and well-nourished. No distress.  HENT:  Head: Normocephalic and atraumatic.  Bilateral TMs are normal. Oropharynx clear and moist with minor clear PND.  Eyes:  EOM are normal.  Neck: Normal range of motion.  Cardiovascular: Normal rate and regular rhythm.   Murmur heard. Pulmonary/Chest: She has no wheezes.  Increased respiratory effort and tachypnea. Crackles in bilateral bases.  Musculoskeletal: Normal range of motion.  Lymphadenopathy:    She has no cervical adenopathy.  Skin: Skin is warm and dry.  Nursing note and vitals reviewed.   Urgent Care Course   Clinical Course     Procedures (including critical care time)  Labs Review Labs Reviewed - No data to display  Imaging Review Dg Chest 2 View  Result Date: 02/21/2016 CLINICAL DATA:  Initial evaluation for acute dyspnea, peripheral edema, teeth kidney a. EXAM: CHEST  2 VIEW COMPARISON:  None available. FINDINGS: Mild to moderate cardiomegaly. Mediastinal silhouette within normal limits. Extensive atherosclerotic plaque within the aorta. Lungs are normally inflated. Perihilar vascular congestion with diffuse interstitial prominence. Few scattered Kerley B-lines present at the lung bases. Findings compatible pulmonary interstitial edema. Small bilateral pleural effusions. No focal infiltrates. No pneumothorax. Compression deformity at the T12 vertebral body, favored to be chronic. No definite acute osseous abnormality. Bones are diffusely osteopenia. IMPRESSION: 1. Cardiomegaly with mild diffuse pulmonary interstitial edema and small bilateral pleural effusions, consistent with acute CHF exacerbation. 2. Extensive atherosclerosis. 3. Wedge deformity of the T12 vertebral body, favored to be chronic in nature. Correlation with history and physical exam recommended. Electronically Signed   By: Rise MuBenjamin  McClintock M.D.   On: 02/21/2016 19:37     Visual Acuity Review  Right Eye Distance:   Left Eye Distance:   Bilateral Distance:    Right Eye Near:   Left Eye Near:    Bilateral Near:         MDM   1. Acute congestive heart failure, unspecified congestive heart failure type (HCC)    2. Acute respiratory distress    [facility] for CHF IV NS KVO O2 at 2L    Hayden Rasmussenavid Shihab States, NP 02/21/16 2017    Hayden Rasmussenavid Anushree Dorsi, NP 02/21/16 2035

## 2016-02-21 NOTE — ED Notes (Signed)
[facility]  [facility]  No  Unit  Available  Ems  Called

## 2016-02-21 NOTE — H&P (Signed)
History and Physical    Bethany Nelson ZOX:[id number] DOB: 05/01/1926 DOA: 02/21/2016   PCP: Bethany Nelson Chief Complaint:  Chief Complaint  Patient presents with  . Congestive Heart Failure    HPI: Bethany Nelson is a 82 y.o. female with medical history significant of DM, HTN.  Patient presents to the ED with c/o SOB for the past 3-4 days.  She has also had peripheral edema that has been slowly worsening since onset a couple of months ago.  Symptoms worse with any activity, seem to be better with sitting up she thinks.  Symptoms are persistent and worsening.  No CP currently, has occasional tightness though.  ED Course: Work up is c/w new onset CHF, pulm edema on CXR, BNP elevated, SBP in the upper 170s, trop 0.19.  Given 40mg  IV lasix in ED.  Review of Systems: As per HPI otherwise 10 point review of systems negative.    Past Medical History:  Diagnosis Date  . Diabetes mellitus without complication (HCC)   . Hypertension     History reviewed. No pertinent surgical history.   reports that she has never smoked. She has never used smokeless tobacco. She reports that she does not drink alcohol or use drugs.  No Known Allergies  Family History  Problem Relation Age of Onset  . Heart disease Neg Hx       Prior to Admission medications   Medication Sig Start Date End Date Taking? Authorizing Provider  aspirin EC 81 MG tablet Take 81 mg by mouth every other day.   Yes Historical Provider, Nelson  atorvastatin (LIPITOR) 40 MG tablet Take 40 mg by mouth daily. 01/08/16  Yes Historical Provider, Nelson  cetirizine (ZYRTEC) 10 MG tablet Take 10 mg by mouth daily as needed for allergies.   Yes Historical Provider, Nelson  donepezil (ARICEPT) 5 MG tablet Take 5 mg by mouth at bedtime. 01/08/16  Yes Historical Provider, Nelson  dorzolamide-timolol (COSOPT) 22.3-6.8 MG/ML ophthalmic solution Place 1 drop into both eyes 2 (two) times daily. 12/16/15  Yes Historical Provider, Nelson  fluticasone  (FLONASE) 50 MCG/ACT nasal spray Place 1 spray into both nostrils daily. 02/17/16  Yes Historical Provider, Nelson  hydrochlorothiazide (HYDRODIURIL) 25 MG tablet Take 25 mg by mouth daily. 01/08/16  Yes Historical Provider, Nelson  LANTUS SOLOSTAR 100 UNIT/ML Solostar Pen Inject 22 Units into the skin every morning. 01/09/16  Yes Historical Provider, Nelson  latanoprost (XALATAN) 0.005 % ophthalmic solution Place 1 drop into both eyes at bedtime. 02/15/16  Yes Historical Provider, Nelson  levothyroxine (SYNTHROID, LEVOTHROID) 75 MCG tablet Take 75 mcg by mouth daily before breakfast. 01/08/16  Yes Historical Provider, Nelson  metoprolol tartrate (LOPRESSOR) 25 MG tablet Take 25 mg by mouth 2 (two) times daily. 01/09/16  Yes Historical Provider, Nelson  valsartan (DIOVAN) 320 MG tablet Take 320 mg by mouth daily. 01/08/16  Yes Historical Provider, Nelson    Physical Exam: Vitals:   02/21/16 2200 02/21/16 2230 02/21/16 2300 02/21/16 2330  BP: 176/71 177/76 167/62 163/60  Pulse: 66 65 63 61  Resp: 26 25 25 24   Temp:      TempSrc:      SpO2: 97% 98% 97% 97%      Constitutional: NAD, calm, comfortable Eyes: PERRL, lids and conjunctivae normal ENMT: Mucous membranes are moist. Posterior pharynx clear of any exudate or lesions.Normal dentition.  Neck: normal, supple, no masses, no thyromegaly Respiratory: clear to auscultation bilaterally, no wheezing, no crackles. Normal respiratory effort. No  accessory muscle use.  Cardiovascular: Regular rate and rhythm, 2/6 SEM. 2+ pitting edema. 2+ pedal pulses. No carotid bruits.  Abdomen: no tenderness, no masses palpated. No hepatosplenomegaly. Bowel sounds positive.  Musculoskeletal: no clubbing / cyanosis. No joint deformity upper and lower extremities. Good ROM, no contractures. Normal muscle tone.  Skin: no rashes, lesions, ulcers. No induration Neurologic: CN 2-12 grossly intact. Sensation intact, DTR normal. Strength 5/5 in all 4.  Psychiatric: Normal judgment and insight.  Alert and oriented x 3. Normal mood.    Labs on Admission: I have personally reviewed following labs and imaging studies  CBC:  Recent Labs Lab 02/21/16 2248 02/21/16 2254  WBC 9.8  --   NEUTROABS 7.8*  --   HGB 11.5* 11.2*  HCT 36.4 33.0*  MCV 97.8  --   PLT 137*  --    Basic Metabolic Panel:  Recent Labs Lab 02/21/16 2248 02/21/16 2254  NA 141 141  K 3.6 3.7  CL 106 105  CO2 28  --   GLUCOSE 94 86  BUN 35* 36*  CREATININE 1.54* 1.40*  CALCIUM 9.0  --    GFR: CrCl cannot be calculated (Unknown ideal weight.). Liver Function Tests:  Recent Labs Lab 02/21/16 2248  AST 27  ALT 19  ALKPHOS 48  BILITOT 0.7  PROT 6.3*  ALBUMIN 3.0*   No results for input(s): LIPASE, AMYLASE in the last 168 hours. No results for input(s): AMMONIA in the last 168 hours. Coagulation Profile: No results for input(s): INR, PROTIME in the last 168 hours. Cardiac Enzymes: No results for input(s): CKTOTAL, CKMB, CKMBINDEX, TROPONINI in the last 168 hours. BNP (last 3 results) No results for input(s): PROBNP in the last 8760 hours. HbA1C: No results for input(s): HGBA1C in the last 72 hours. CBG: No results for input(s): GLUCAP in the last 168 hours. Lipid Profile: No results for input(s): CHOL, HDL, LDLCALC, TRIG, CHOLHDL, LDLDIRECT in the last 72 hours. Thyroid Function Tests: No results for input(s): TSH, T4TOTAL, FREET4, T3FREE, THYROIDAB in the last 72 hours. Anemia Panel: No results for input(s): VITAMINB12, FOLATE, FERRITIN, TIBC, IRON, RETICCTPCT in the last 72 hours. Urine analysis:    Component Value Date/Time   COLORURINE Yellow 05/21/2013 2205   APPEARANCEUR Cloudy 05/21/2013 2205   LABSPEC 1.012 05/21/2013 2205   PHURINE 5.0 05/21/2013 2205   GLUCOSEU 150 mg/dL 08/65/784603/14/2015 [id number]   HGBUR 1+ 05/21/2013 2205   BILIRUBINUR Negative 05/21/2013 2205   KETONESUR Trace 05/21/2013 2205   PROTEINUR 100 mg/dL 52/84/132403/14/2015 [id number]   NITRITE Negative 05/21/2013 2205    LEUKOCYTESUR Negative 05/21/2013 2205   Sepsis Labs: @LABRCNTIP (procalcitonin:4,lacticidven:4) )No results found for this or any previous visit (from the past 240 hour(s)).   Radiological Exams on Admission: Dg Chest 2 View  Result Date: 02/21/2016 CLINICAL DATA:  Initial evaluation for acute dyspnea, peripheral edema, teeth kidney a. EXAM: CHEST  2 VIEW COMPARISON:  None available. FINDINGS: Mild to moderate cardiomegaly. Mediastinal silhouette within normal limits. Extensive atherosclerotic plaque within the aorta. Lungs are normally inflated. Perihilar vascular congestion with diffuse interstitial prominence. Few scattered Kerley B-lines present at the lung bases. Findings compatible pulmonary interstitial edema. Small bilateral pleural effusions. No focal infiltrates. No pneumothorax. Compression deformity at the T12 vertebral body, favored to be chronic. No definite acute osseous abnormality. Bones are diffusely osteopenia. IMPRESSION: 1. Cardiomegaly with mild diffuse pulmonary interstitial edema and small bilateral pleural effusions, consistent with acute CHF exacerbation. 2. Extensive atherosclerosis. 3. Wedge deformity of the T12 vertebral  body, favored to be chronic in nature. Correlation with history and physical exam recommended. Electronically Signed   By: Rise Mu M.D.   On: 02/21/2016 19:37    EKG: Independently reviewed.  Assessment/Plan Principal Problem:   New onset of congestive heart failure (HCC) Active Problems:   HTN (hypertension)   DM2 (diabetes mellitus, type 2) (HCC)   CKD stage 3 due to type 2 diabetes mellitus (HCC)    1. New onset CHF - 1. CHF pathway 2. 2d echo 3. Lasix 40mg  IV daily, first dose in ED now 4. Daily BMP 5. Continue ARB, beta blocker 2. HTN - 1. Continue home meds 2. If BP still running high then will add PRN, but she didn't take morning meds today either 3. DM2 - 1. Continue lantus 22 units QAM 2. CBG checks AC/HS 4. CKD  stage 3 - 1. Suspect todays numbers are at baseline, however last available labs are from 2013 on the system 2. Daily BMP to monitor with diuresis   DVT prophylaxis: Lovenox Code Status: Full Family Communication: Family at bedside Consults called: None Admission status: Admit to inpatient   Hillary Bow DO [facility] Pager [phone] from 7PM-7AM  If 7AM-7PM, please contact the day physician for the patient [email or web address] Password [email or web address]  02/21/2016, 11:44 PM

## 2016-02-21 NOTE — ED Notes (Signed)
Pt  On  Nasal o2   And   Cardiac  Monitor     Iv  Started  By  scarlett  rn   Pt  To be transferred  To er  By  [facility]

## 2016-02-22 ENCOUNTER — Inpatient Hospital Stay (HOSPITAL_COMMUNITY): Payer: Medicare PPO

## 2016-02-22 DIAGNOSIS — I10 Essential (primary) hypertension: Secondary | ICD-10-CM

## 2016-02-22 DIAGNOSIS — E1122 Type 2 diabetes mellitus with diabetic chronic kidney disease: Secondary | ICD-10-CM

## 2016-02-22 DIAGNOSIS — N183 Chronic kidney disease, stage 3 (moderate): Secondary | ICD-10-CM

## 2016-02-22 DIAGNOSIS — I509 Heart failure, unspecified: Secondary | ICD-10-CM

## 2016-02-22 LAB — BASIC METABOLIC PANEL
ANION GAP: 6 (ref 5–15)
BUN: 34 mg/dL — ABNORMAL HIGH (ref 6–20)
CALCIUM: 8.6 mg/dL — AB (ref 8.9–10.3)
CO2: 31 mmol/L (ref 22–32)
Chloride: 104 mmol/L (ref 101–111)
Creatinine, Ser: 1.46 mg/dL — ABNORMAL HIGH (ref 0.44–1.00)
GFR, EST AFRICAN AMERICAN: 36 mL/min — AB (ref >60)
GFR, EST NON AFRICAN AMERICAN: 31 mL/min — AB (ref >60)
Glucose, Bld: 93 mg/dL (ref 65–99)
Potassium: 3.3 mmol/L — ABNORMAL LOW (ref 3.5–5.1)
SODIUM: 141 mmol/L (ref 135–145)

## 2016-02-22 LAB — GLUCOSE, CAPILLARY
GLUCOSE-CAPILLARY: 197 mg/dL — AB (ref 65–99)
Glucose-Capillary: 152 mg/dL — ABNORMAL HIGH (ref 65–99)
Glucose-Capillary: 170 mg/dL — ABNORMAL HIGH (ref 65–99)
Glucose-Capillary: 80 mg/dL (ref 65–99)
Glucose-Capillary: 82 mg/dL (ref 65–99)

## 2016-02-22 LAB — ECHOCARDIOGRAM COMPLETE
HEIGHTINCHES: 65 in
WEIGHTICAEL: 3051.2 [oz_av]

## 2016-02-22 LAB — TROPONIN I
TROPONIN I: 0.21 ng/mL — AB (ref <0.03)
TROPONIN I: 0.21 ng/mL — AB (ref <0.03)
Troponin I: 0.16 ng/mL (ref <0.03)

## 2016-02-22 MED ORDER — TIMOLOL MALEATE 0.5 % OP SOLN
1.0000 [drp] | Freq: Two times a day (BID) | OPHTHALMIC | Status: DC
  Administered 2016-02-22 – 2016-02-25 (×8): 1 [drp] via OPHTHALMIC
  Filled 2016-02-22: qty 5

## 2016-02-22 MED ORDER — ALBUTEROL SULFATE (2.5 MG/3ML) 0.083% IN NEBU
2.5000 mg | INHALATION_SOLUTION | RESPIRATORY_TRACT | Status: DC | PRN
  Administered 2016-02-22 – 2016-02-23 (×2): 2.5 mg via RESPIRATORY_TRACT
  Filled 2016-02-22 (×2): qty 3

## 2016-02-22 MED ORDER — IPRATROPIUM-ALBUTEROL 0.5-2.5 (3) MG/3ML IN SOLN
3.0000 mL | Freq: Four times a day (QID) | RESPIRATORY_TRACT | Status: DC
  Administered 2016-02-22: 3 mL via RESPIRATORY_TRACT
  Filled 2016-02-22: qty 3

## 2016-02-22 MED ORDER — INSULIN GLARGINE 100 UNIT/ML ~~LOC~~ SOLN
18.0000 [IU] | Freq: Every day | SUBCUTANEOUS | Status: DC
  Administered 2016-02-23 – 2016-02-24 (×2): 18 [IU] via SUBCUTANEOUS
  Filled 2016-02-22 (×2): qty 0.18

## 2016-02-22 MED ORDER — DORZOLAMIDE HCL 2 % OP SOLN
1.0000 [drp] | Freq: Two times a day (BID) | OPHTHALMIC | Status: DC
  Administered 2016-02-22 – 2016-02-25 (×8): 1 [drp] via OPHTHALMIC
  Filled 2016-02-22: qty 10

## 2016-02-22 NOTE — Progress Notes (Signed)
PROGRESS NOTE  Bethany OhmsMargie M Nelson  ZOX:[id number] DOB: 03/31/1926  DOA: 02/21/2016 PCP: [name], MD   Brief Narrative:  82 year old female with PMH of DM, HTN, HLD, dementia and hypothyroidism presented to ED with worsening dyspnea of 3-4 days duration, peripheral edema ongoing for a couple of months. Denied chest pain. Admitted for CHF exacerbation.   Assessment & Plan:   Principal Problem:   New onset of congestive heart failure (HCC) Active Problems:   HTN (hypertension)   DM2 (diabetes mellitus, type 2) (HCC)   CKD stage 3 due to type 2 diabetes mellitus (HCC)   1. New onset CHF, unknown type: Follow 2-D echo. Started on Lasix 40 mg IV daily. Continue ARB. Hold HCTZ while on IV Lasix. -640 mL since admission. Monitor BMP closely while on diuretics. 2. Elevated troponin: May be demand ischemia from CHF. No chest pain reported. Flat trend. Follow 2-D echo and if abnormal/wall motion abnormalities, consider cardiology consultation. Continue aspirin, statins, beta blockers and ARB. 3. Essential hypertension: Reasonable inpatient control. 4. Type II DM/IDDM: Monitor CBGs closely. Continue Lantus and consider SSI. 5. Stage III chronic kidney disease: Baseline creatinine not known. Last creatinine in system was on 04/29/11:1.17. Admitted with creatinine of 1.54. This may be her baseline. Follow BMP. 6. Hyperlipidemia: Continue statins. 7. Hypothyroid: Continue Synthroid. 8. Anemia and thrombocytopenia: Follow CBCs. 9. Hypokalemia: Replace and follow.   DVT prophylaxis: Lovenox Code Status: Full Family Communication: None at bedside Disposition Plan: DC home when medically stable   Consultants:   None  Procedures:   None  Antimicrobials:   None    Subjective: Feels better. States that her dyspnea is improved and denies chest pain. Indicates leg swelling hasn't changed much. She complains of runny nose and cough with white sputum.  Objective:  Vitals:   02/22/16  0558 02/22/16 0921 02/22/16 1100 02/22/16 1504  BP: (!) 154/55  (!) 143/47   Pulse: (!) 55  68   Resp: 18  16   Temp: 97.5 F (36.4 C)  98.2 F (36.8 C)   TempSrc: Oral  Oral   SpO2: 96% 97% 98% 98%  Weight:      Height:        Intake/Output Summary (Last 24 hours) at 02/22/16 1525 Last data filed at 02/22/16 1449  Gross per 24 hour  Intake              720 ml  Output             1790 ml  Net            -1070 ml   Filed Weights   02/22/16 0033  Weight: 86.5 kg (190 lb 11.2 oz)    Examination:  General exam: Pleasant elderly female lying propped up in bed without distress. Respiratory system: Diminished breath sounds in the bases with few basal crackles. Rest of lung fields clear to auscultation. Respiratory effort normal. Cardiovascular system: S1 & S2 heard, RRR. No JVD, murmurs, rubs, gallops or clicks. 2+ pedal edema. Telemetry: Sinus rhythm. Gastrointestinal system: Abdomen is nondistended, soft and nontender. No organomegaly or masses felt. Normal bowel sounds heard. Central nervous system: Alert and oriented to person and place. No focal neurological deficits. Extremities: Symmetric 5 x 5 power. Skin: No rashes, lesions or ulcers Psychiatry: Judgement and insight appear impaired. Mood & affect appropriate.     Data Reviewed: I have personally reviewed following labs and imaging studies  CBC:  Recent Labs Lab 02/21/16 2248 02/21/16 2254  WBC 9.8  --   NEUTROABS 7.8*  --   HGB 11.5* 11.2*  HCT 36.4 33.0*  MCV 97.8  --   PLT 137*  --    Basic Metabolic Panel:  Recent Labs Lab 02/21/16 2248 02/21/16 2254 02/22/16 0543  NA 141 141 141  K 3.6 3.7 3.3*  CL 106 105 104  CO2 28  --  31  GLUCOSE 94 86 93  BUN 35* 36* 34*  CREATININE 1.54* 1.40* 1.46*  CALCIUM 9.0  --  8.6*   GFR: Estimated Creatinine Clearance: 29.5 mL/min (by C-G formula based on SCr of 1.46 mg/dL (H)). Liver Function Tests:  Recent Labs Lab 02/21/16 2248  AST 27  ALT 19    ALKPHOS 48  BILITOT 0.7  PROT 6.3*  ALBUMIN 3.0*   No results for input(s): LIPASE, AMYLASE in the last 168 hours. No results for input(s): AMMONIA in the last 168 hours. Coagulation Profile: No results for input(s): INR, PROTIME in the last 168 hours. Cardiac Enzymes:  Recent Labs Lab 02/21/16 2248 02/22/16 0543 02/22/16 1056  TROPONINI 0.16* 0.21* 0.21*   BNP (last 3 results) No results for input(s): PROBNP in the last 8760 hours. HbA1C: No results for input(s): HGBA1C in the last 72 hours. CBG:  Recent Labs Lab 02/22/16 0043 02/22/16 0554 02/22/16 1211  GLUCAP 80 82 170*   Lipid Profile: No results for input(s): CHOL, HDL, LDLCALC, TRIG, CHOLHDL, LDLDIRECT in the last 72 hours. Thyroid Function Tests: No results for input(s): TSH, T4TOTAL, FREET4, T3FREE, THYROIDAB in the last 72 hours. Anemia Panel: No results for input(s): VITAMINB12, FOLATE, FERRITIN, TIBC, IRON, RETICCTPCT in the last 72 hours.  Sepsis Labs: No results for input(s): PROCALCITON, LATICACIDVEN in the last 168 hours.  No results found for this or any previous visit (from the past 240 hour(s)).       Radiology Studies: Dg Chest 2 View  Result Date: 02/21/2016 CLINICAL DATA:  Initial evaluation for acute dyspnea, peripheral edema, teeth kidney a. EXAM: CHEST  2 VIEW COMPARISON:  None available. FINDINGS: Mild to moderate cardiomegaly. Mediastinal silhouette within normal limits. Extensive atherosclerotic plaque within the aorta. Lungs are normally inflated. Perihilar vascular congestion with diffuse interstitial prominence. Few scattered Kerley B-lines present at the lung bases. Findings compatible pulmonary interstitial edema. Small bilateral pleural effusions. No focal infiltrates. No pneumothorax. Compression deformity at the T12 vertebral body, favored to be chronic. No definite acute osseous abnormality. Bones are diffusely osteopenia. IMPRESSION: 1. Cardiomegaly with mild diffuse pulmonary  interstitial edema and small bilateral pleural effusions, consistent with acute CHF exacerbation. 2. Extensive atherosclerosis. 3. Wedge deformity of the T12 vertebral body, favored to be chronic in nature. Correlation with history and physical exam recommended. Electronically Signed   By: Rise MuBenjamin  McClintock M.D.   On: 02/21/2016 19:37        Scheduled Meds: . aspirin EC  81 mg Oral QODAY  . atorvastatin  40 mg Oral Daily  . donepezil  5 mg Oral QHS  . dorzolamide  1 drop Both Eyes BID   And  . timolol  1 drop Both Eyes BID  . enoxaparin (LOVENOX) injection  40 mg Subcutaneous Daily  . fluticasone  1 spray Each Nare Daily  . furosemide  40 mg Intravenous Daily  . hydrochlorothiazide  25 mg Oral Daily  . insulin glargine  22 Units Subcutaneous Daily  . ipratropium-albuterol  3 mL Nebulization Q6H  . irbesartan  300 mg Oral Daily  . latanoprost  1  drop Both Eyes QHS  . levothyroxine  75 mcg Oral QAC breakfast  . metoprolol tartrate  25 mg Oral BID  . sodium chloride flush  3 mL Intravenous Q12H   Continuous Infusions:   LOS: 1 day      Josephene Marrone, MD [facility] Pager [phone]  If 7PM-7AM, please contact night-coverage [email or web address] Password [email or web address] 02/22/2016, 3:25 PM

## 2016-02-22 NOTE — Progress Notes (Signed)
  Echocardiogram 2D Echocardiogram has been performed.  Bethany Nelson L Androw 02/22/2016, 1:36 PM

## 2016-02-22 NOTE — Care Management Note (Signed)
Case Management Note  Patient Details  Name: Bethany OhmsMargie M Nelson MRN: [id number] Date of Birth: 11/28/1926  Subjective/Objective:    Admitted with CHF        Action/Plan: CM talked to patient with daughter - [name] present; She lives at home with her daughter; PCP is [name], MD; [facility] with prescription drug coverage; [facility] of choice [facility]; [name] reports no problem getting her medication; DME- walker at home; she has scales at home; CM informed patient of the importance of weighing herself daily; Her daughter cooks a heart health diet low in sodium; Patient could benefit from a Disease Management Program for CHF; HHC choice offered. [facility]; Attending MD at discharge please enter the face to face [facility]. CM will continue to follow for DCP.  Expected Discharge Date:    possibly 02/25/2016              Expected Discharge Plan:  Home w Home Health Services  Discharge planning Services  CM Consult   Choice offered to:  Patient, Adult Children  HH Arranged:  RN, Disease Management, PT HH Agency:  [facility]  Status of Service:  In process, will continue to follow  Reola MosherChandler, Kataryna Mcquilkin L, RN,MHA,BSN [phone] 02/22/2016, 2:41 PM

## 2016-02-22 NOTE — Evaluation (Signed)
Physical Therapy Evaluation Patient Details Name: Harlow OhmsMargie M Ciaravino MRN: [id number] DOB: 04/28/1926 Today's Date: 02/22/2016   History of Present Illness  82 y.o. female with medical history significant of DM, HTN.  Patient presents to the ED with c/o SOB for the past 3-4 days  Clinical Impression  Patient seen for evaluation. Patient demonstrates deficits in functional mobility as indicated below. Will benefit from continued skilled PT to address deficits and maximize function. Will see as indicated and progress as tolerated. Recommend  HHPT upon acute discharge.    Follow Up Recommendations Home health PT;Supervision for mobility/OOB    Equipment Recommendations  None recommended by PT    Recommendations for Other Services       Precautions / Restrictions Precautions Precautions: Fall Precaution Comments: watch O2 saturations      Mobility  Bed Mobility Overal bed mobility: Needs Assistance Bed Mobility: Supine to Sit;Sit to Supine     Supine to sit: Min assist;HOB elevated Sit to supine: Min guard;HOB elevated   General bed mobility comments: min assist to elevate trunk and rotate to EOb, assist to return to supine with HOB elevate  Transfers Overall transfer level: Needs assistance Equipment used: Rolling walker (2 wheeled) Transfers: Sit to/from [employer] CorporationStand;Stand Pivot Transfers Sit to Stand: Min assist Stand pivot transfers: Min assist       General transfer comment: min assist for stability in coming to upright and during SPT to [facility]. Patient with posterior list noted. Assist to stabilize over BOS  Ambulation/Gait Ambulation/Gait assistance: Min guard Ambulation Distance (Feet): 80 Feet Assistive device: Rolling walker (2 wheeled) Gait Pattern/deviations: Step-through pattern;Decreased stride length;Drifts right/left;Trunk flexed;Narrow base of support Gait velocity: decreased Gait velocity interpretation: Below normal speed for age/gender General Gait Details:  patient with increased drift during ambulation and some instability noted. difficulty manuevering RW (patient accustomed to 4 wheeled walker at baseline). 2 standing rest breaks, noted DOE 3/4 with audible crackling during breaths. On room air desaturated to 89 %  Stairs            Wheelchair Mobility    Modified Rankin (Stroke Patients Only)       Balance Overall balance assessment: Needs assistance   Sitting balance-Leahy Scale: Fair       Standing balance-Leahy Scale: Poor Standing balance comment: instability noted, min guard to min assist during static standing                             Pertinent Vitals/Pain Pain Assessment: No/denies pain    Home Living Family/patient expects to be discharged to:: Private residence Living Arrangements: Children Available Help at Discharge: Family Type of Home: House       Home Layout: One level Home Equipment: Environmental consultantWalker - 4 wheels      Prior Function Level of Independence: Independent with assistive device(s)         Comments: uses Rollator at home     Hand Dominance   Dominant Hand: Right    Extremity/Trunk Assessment   Upper Extremity Assessment Upper Extremity Assessment: Generalized weakness    Lower Extremity Assessment Lower Extremity Assessment: Generalized weakness    Cervical / Trunk Assessment Cervical / Trunk Assessment: Kyphotic  Communication   Communication: HOH  Cognition Arousal/Alertness: Awake/alert Behavior During Therapy: WFL for tasks assessed/performed Overall Cognitive Status: History of cognitive impairments - at baseline                 General Comments: some  memory deficits notecd    General Comments      Exercises     Assessment/Plan    PT Assessment Patient needs continued PT services  PT Problem List Decreased strength;Decreased activity tolerance;Decreased balance;Decreased mobility;Cardiopulmonary status limiting activity          PT  Treatment Interventions DME instruction;Gait training;Functional mobility training;Therapeutic exercise;Therapeutic activities;Balance training;Cognitive remediation;Patient/family education    PT Goals (Current goals can be found in the Care Plan section)  Acute Rehab PT Goals Patient Stated Goal: to go home PT Goal Formulation: With patient/family Time For Goal Achievement: 03/07/16 Potential to Achieve Goals: Good    Frequency Min 3X/week   Barriers to discharge        Co-evaluation               End of Session Equipment Utilized During Treatment: Gait belt;Oxygen Activity Tolerance: Patient tolerated treatment well Patient left: in bed;with call bell/phone within reach;with family/visitor present Nurse Communication: Mobility status         Time: [id number] PT Time Calculation (min) (ACUTE ONLY): 18 min   Charges:   PT Evaluation $PT Eval Moderate Complexity: 1 Procedure     PT G Codes:        Fabio AsaDevon J Danen Lapaglia 02/22/2016, 3:12 PM Charlotte Crumbevon Abisai Deer, PT DPT  [phone]

## 2016-02-23 DIAGNOSIS — I509 Heart failure, unspecified: Secondary | ICD-10-CM

## 2016-02-23 LAB — BASIC METABOLIC PANEL
ANION GAP: 8 (ref 5–15)
BUN: 36 mg/dL — ABNORMAL HIGH (ref 6–20)
CALCIUM: 8.4 mg/dL — AB (ref 8.9–10.3)
CO2: 30 mmol/L (ref 22–32)
Chloride: 101 mmol/L (ref 101–111)
Creatinine, Ser: 1.57 mg/dL — ABNORMAL HIGH (ref 0.44–1.00)
GFR, EST AFRICAN AMERICAN: 33 mL/min — AB (ref >60)
GFR, EST NON AFRICAN AMERICAN: 29 mL/min — AB (ref >60)
Glucose, Bld: 67 mg/dL (ref 65–99)
Potassium: 3.7 mmol/L (ref 3.5–5.1)
SODIUM: 139 mmol/L (ref 135–145)

## 2016-02-23 LAB — GLUCOSE, CAPILLARY
GLUCOSE-CAPILLARY: 81 mg/dL (ref 65–99)
GLUCOSE-CAPILLARY: 116 mg/dL — AB (ref 65–99)
Glucose-Capillary: 131 mg/dL — ABNORMAL HIGH (ref 65–99)
Glucose-Capillary: 204 mg/dL — ABNORMAL HIGH (ref 65–99)

## 2016-02-23 LAB — CBC
HCT: 31.6 % — ABNORMAL LOW (ref 36.0–46.0)
HEMOGLOBIN: 10.3 g/dL — AB (ref 12.0–15.0)
MCH: 31.7 pg (ref 26.0–34.0)
MCHC: 32.6 g/dL (ref 30.0–36.0)
MCV: 97.2 fL (ref 78.0–100.0)
PLATELETS: 151 10*3/uL (ref 150–400)
RBC: 3.25 MIL/uL — AB (ref 3.87–5.11)
RDW: 14.5 % (ref 11.5–15.5)
WBC: 7.2 10*3/uL (ref 4.0–10.5)

## 2016-02-23 MED ORDER — POTASSIUM CHLORIDE CRYS ER 20 MEQ PO TBCR
40.0000 meq | EXTENDED_RELEASE_TABLET | Freq: Once | ORAL | Status: AC
  Administered 2016-02-23: 40 meq via ORAL
  Filled 2016-02-23: qty 2

## 2016-02-23 MED ORDER — CETIRIZINE HCL 5 MG/5ML PO SYRP
5.0000 mg | ORAL_SOLUTION | Freq: Every day | ORAL | Status: DC
  Administered 2016-02-23 – 2016-02-25 (×3): 5 mg via ORAL
  Filled 2016-02-23 (×3): qty 5

## 2016-02-23 MED ORDER — FUROSEMIDE 20 MG PO TABS
20.0000 mg | ORAL_TABLET | Freq: Every day | ORAL | Status: DC
  Administered 2016-02-23 – 2016-02-25 (×3): 20 mg via ORAL
  Filled 2016-02-23 (×3): qty 1

## 2016-02-23 NOTE — Progress Notes (Signed)
PROGRESS NOTE  Bethany Nelson  [id number] DOB: 08/06/1926  DOA: 02/21/2016 PCP: [name], MD   Brief Narrative:  82 year old female with PMH of DM, HTN, HLD, dementia and hypothyroidism presented to ED with worsening dyspnea of 3-4 days duration, peripheral edema ongoing for a couple of months. Denied chest pain. Admitted for CHF exacerbation.   Assessment & Plan:   Principal Problem:   New onset of congestive heart failure (HCC) Active Problems:   HTN (hypertension)   DM2 (diabetes mellitus, type 2) (HCC)   CKD stage 3 due to type 2 diabetes mellitus (HCC)  Acute diastolic CHF, 2-D echo EF normal, with a greater grade 3 diastolic dysfunction, significantly improving with IV diuresis, will transition to oral in a.m. per cardiology recommendation . Continue ARB and BB,  -1,960  mL since admission. Monitor BMP closely while on diuretics.  Elevated troponin: Cardiology input appreciated ,no evolution no chest pain no acute ECG changes can consider [facility]   Essential hypertension: Reasonable inpatient control.  Type II DM/IDDM: Monitor CBGs closely. Continue Lantus   Stage III chronic kidney disease: Baseline creatinine not known. Last creatinine in system was on 04/29/11:1.17. Admitted with creatinine of 1.54. This may be her baseline. Follow BMP.  Hyperlipidemia: Continue statins.  Hypothyroid: Continue Synthroid.  Anemia and thrombocytopenia: Follow CBCs.  Hypokalemia: Replace and follow.   DVT prophylaxis: Lovenox Code Status: Full Family Communication: daughter  at bedside Disposition Plan: DC home when medically stable   Consultants:   cardiology  Procedures:   None  Antimicrobials:   None    Subjective: Feels better. States that her dyspnea is improved and denies chest pain.  She complains of runny nose and cough with white sputum.  Objective:  Vitals:   02/23/16 0800 02/23/16 0958 02/23/16 1041  02/23/16 1200  BP: (!) 147/58   (!) 140/40  Pulse: (!) 48 (!) 57  (!) 53  Resp: 18   18  Temp: 97.8 F (36.6 C)   97.4 F (36.3 C)  TempSrc: Oral   Oral  SpO2: 97%  92% 93%  Weight:      Height:        Intake/Output Summary (Last 24 hours) at 02/23/16 1234 Last data filed at 02/23/16 1134  Gross per 24 hour  Intake              720 ml  Output             2400 ml  Net            -1680 ml   Filed Weights   02/22/16 0033 02/23/16 0500  Weight: 86.5 kg (190 lb 11.2 oz) 85.5 kg (188 lb 6.4 oz)    Examination:  General exam: Pleasant elderly female lying propped up in bed without distress. Respiratory system: Diminished breath sounds in the bases with few basal crackles. Rest of lung fields clear to auscultation. Respiratory effort normal. Cardiovascular system: S1 & S2 heard, RRR. No JVD, murmurs, rubs, gallops or clicks. 2+ pedal edema. Telemetry: Sinus rhythm. Gastrointestinal system: Abdomen is nondistended, soft and nontender. No organomegaly or masses felt. Normal bowel sounds heard. Central nervous system: Alert and oriented to person and place. No focal neurological deficits. Extremities: Symmetric 5 x 5 power. Skin: No rashes, lesions or ulcers Psychiatry: Judgement and insight appear impaired. Mood & affect appropriate.     Data Reviewed: I have personally reviewed following labs and imaging studies  CBC:  Recent  Labs Lab 02/21/16 2248 02/21/16 2254 02/23/16 0302  WBC 9.8  --  7.2  NEUTROABS 7.8*  --   --   HGB 11.5* 11.2* 10.3*  HCT 36.4 33.0* 31.6*  MCV 97.8  --  97.2  PLT 137*  --  151   Basic Metabolic Panel:  Recent Labs Lab 02/21/16 2248 02/21/16 2254 02/22/16 0543 02/23/16 0302  NA 141 141 141 139  K 3.6 3.7 3.3* 3.7  CL 106 105 104 101  CO2 28  --  31 30  GLUCOSE 94 86 93 67  BUN 35* 36* 34* 36*  CREATININE 1.54* 1.40* 1.46* 1.57*  CALCIUM 9.0  --  8.6* 8.4*   GFR: Estimated Creatinine Clearance: 27.3 mL/min (by C-G formula based on  SCr of 1.57 mg/dL (H)). Liver Function Tests:  Recent Labs Lab 02/21/16 2248  AST 27  ALT 19  ALKPHOS 48  BILITOT 0.7  PROT 6.3*  ALBUMIN 3.0*   No results for input(s): LIPASE, AMYLASE in the last 168 hours. No results for input(s): AMMONIA in the last 168 hours. Coagulation Profile: No results for input(s): INR, PROTIME in the last 168 hours. Cardiac Enzymes:  Recent Labs Lab 02/21/16 2248 02/22/16 0543 02/22/16 1056  TROPONINI 0.16* 0.21* 0.21*   BNP (last 3 results) No results for input(s): PROBNP in the last 8760 hours. HbA1C: No results for input(s): HGBA1C in the last 72 hours. CBG:  Recent Labs Lab 02/22/16 1211 02/22/16 1646 02/22/16 2045 02/23/16 0621 02/23/16 1132  GLUCAP 170* 197* 152* 81 116*   Lipid Profile: No results for input(s): CHOL, HDL, LDLCALC, TRIG, CHOLHDL, LDLDIRECT in the last 72 hours. Thyroid Function Tests: No results for input(s): TSH, T4TOTAL, FREET4, T3FREE, THYROIDAB in the last 72 hours. Anemia Panel: No results for input(s): VITAMINB12, FOLATE, FERRITIN, TIBC, IRON, RETICCTPCT in the last 72 hours.  Sepsis Labs: No results for input(s): PROCALCITON, LATICACIDVEN in the last 168 hours.  No results found for this or any previous visit (from the past 240 hour(s)).       Radiology Studies: Dg Chest 2 View  Result Date: 02/21/2016 CLINICAL DATA:  Initial evaluation for acute dyspnea, peripheral edema, teeth kidney a. EXAM: CHEST  2 VIEW COMPARISON:  None available. FINDINGS: Mild to moderate cardiomegaly. Mediastinal silhouette within normal limits. Extensive atherosclerotic plaque within the aorta. Lungs are normally inflated. Perihilar vascular congestion with diffuse interstitial prominence. Few scattered Kerley B-lines present at the lung bases. Findings compatible pulmonary interstitial edema. Small bilateral pleural effusions. No focal infiltrates. No pneumothorax. Compression deformity at the T12 vertebral body, favored  to be chronic. No definite acute osseous abnormality. Bones are diffusely osteopenia. IMPRESSION: 1. Cardiomegaly with mild diffuse pulmonary interstitial edema and small bilateral pleural effusions, consistent with acute CHF exacerbation. 2. Extensive atherosclerosis. 3. Wedge deformity of the T12 vertebral body, favored to be chronic in nature. Correlation with history and physical exam recommended. Electronically Signed   By: Rise Mu M.D.   On: 02/21/2016 19:37        Scheduled Meds: . aspirin EC  81 mg Oral QODAY  . atorvastatin  40 mg Oral Daily  . cetirizine HCl  5 mg Oral Daily  . donepezil  5 mg Oral QHS  . dorzolamide  1 drop Both Eyes BID   And  . timolol  1 drop Both Eyes BID  . enoxaparin (LOVENOX) injection  40 mg Subcutaneous Daily  . fluticasone  1 spray Each Nare Daily  . furosemide  20  mg Oral Daily  . insulin glargine  18 Units Subcutaneous Daily  . irbesartan  300 mg Oral Daily  . latanoprost  1 drop Both Eyes QHS  . levothyroxine  75 mcg Oral QAC breakfast  . metoprolol tartrate  25 mg Oral BID  . sodium chloride flush  3 mL Intravenous Q12H   Continuous Infusions:   LOS: 2 days      Huey BienenstockELGERGAWY, Kingdom Vanzanten, MD [facility] Pager [phone]  If 7PM-7AM, please contact night-coverage [email or web address] Password [email or web address] 02/23/2016, 12:34 PM

## 2016-02-23 NOTE — Progress Notes (Signed)
Physical Therapy Treatment Patient Details Name: Bethany Nelson MRN: [id number] DOB: 12/10/1926 Today's Date: 02/23/2016    History of Present Illness 82 y.o. female with medical history significant of DM, HTN.  Patient presents to the ED with c/o SOB for the past 3-4 days    PT Comments    Patient progressing towards PT goals. Initially reluctant to mobilize, but tolerated well. Ambulatedincreased distance with 4 wheeled RW today. Ambulated on room air with desaturations to 90%, HR 110s. Improved to 95% upon return to room with rest. Will continue to see and progress as tolerated.  Follow Up Recommendations  Home health PT;Supervision for mobility/OOB     Equipment Recommendations  None recommended by PT    Recommendations for Other Services       Precautions / Restrictions Precautions Precautions: Fall Precaution Comments: watch O2 saturations    Mobility  Bed Mobility Overal bed mobility: Needs Assistance Bed Mobility: Supine to Sit;Sit to Supine     Supine to sit: Min assist;HOB elevated Sit to supine: Min guard;HOB elevated   General bed mobility comments: continues to require min assist to come to EOB... increased time and effort noted. Assist to return to bed with HOB flat  Transfers Overall transfer level: Needs assistance Equipment used: Rolling walker (2 wheeled) Transfers: Sit to/from [employer] CorporationStand;Stand Pivot Transfers Sit to Stand: Supervision Stand pivot transfers: Min assist       General transfer comment: Supervision to come to standing, min assist for stability [facility].  Ambulation/Gait Ambulation/Gait assistance: Supervision Ambulation Distance (Feet): 180 Feet Assistive device: Rolling walker (2 wheeled) Gait Pattern/deviations: Step-through pattern;Decreased stride length;Drifts right/left;Trunk flexed;Narrow base of support Gait velocity: decreased Gait velocity interpretation: Below normal speed for age/gender General  Gait Details: tolerated increased distance today saturations to 90% on room air with 2 standing rest breaks   Stairs            Wheelchair Mobility    Modified Rankin (Stroke Patients Only)       Balance Overall balance assessment: Needs assistance   Sitting balance-Leahy Scale: Fair       Standing balance-Leahy Scale: Poor Standing balance comment: instability noted, min guard to min assist during static standing                    Cognition Arousal/Alertness: Awake/alert Behavior During Therapy: WFL for tasks assessed/performed Overall Cognitive Status: History of cognitive impairments - at baseline                 General Comments: some memory deficits notecd    Exercises      General Comments        Pertinent Vitals/Pain Pain Assessment: No/denies pain    Home Living                      Prior Function            PT Goals (current goals can now be found in the care plan section) Acute Rehab PT Goals Patient Stated Goal: to go home PT Goal Formulation: With patient/family Time For Goal Achievement: 03/07/16 Potential to Achieve Goals: Good Progress towards PT goals: Progressing toward goals    Frequency    Min 3X/week      PT Plan Current plan remains appropriate    Co-evaluation             End of Session Equipment Utilized During Treatment: Gait belt;Oxygen Activity Tolerance: Patient  tolerated treatment well Patient left: in bed;with call bell/phone within reach;with family/visitor present     Time: [id number] PT Time Calculation (min) (ACUTE ONLY): 22 min  Charges:  $Gait Training: 8-22 mins                    G Codes:      Fabio AsaDevon J Levenia Skalicky 02/23/2016, 3:40 PM Charlotte Crumbevon Saul Fabiano, PT DPT  [phone]

## 2016-02-23 NOTE — Consult Note (Signed)
CARDIOLOGY CONSULT NOTE       Patient ID: Bethany Nelson MRN: [id number] DOB/AGE: 80/03/1926 80 y.o.  Admit dateHarlow Nelson: 02/21/2016 Referring Physician: Elgergamwy Primary Physician: [name], MD Primary Cardiologist:  [facility] Reason for Consultation: CHF  Principal Problem:   New onset of congestive heart failure (HCC) Active Problems:   HTN (hypertension)   DM2 (diabetes mellitus, type 2) (HCC)   CKD stage 3 due to type 2 diabetes mellitus (HCC)   HPI:   Bethany OhmsMargie M Nelson is a 82 y.o. female with medical history significant of DM, HTN.  Patient presents to the ED with c/o SOB for the past 3-4 days.  She has also had peripheral edema that has been slowly worsening since onset a couple of months ago.  Symptoms worse with any activity, seem to be better with sitting up she thinks.  Symptoms are persistent and worsening.  No CP currently, has occasional tightness though. Also has had some nasal congestion and ? URI.  Found to be in CHF:  BNP  749 CXR pulmonary interstitial edema  Mildly elevated troponin .16 .21   This am doing better with diuresis . Ambulating and continues to have no chest pain ROS All other systems reviewed and negative except as noted above  Past Medical History:  Diagnosis Date  . Diabetes mellitus without complication (HCC)   . Hypertension     Family History  Problem Relation Age of Onset  . Heart disease Neg Hx     Social History   Social History  . Marital status: Widowed    Spouse name: N/A  . Number of children: N/A  . Years of education: N/A   Occupational History  . Not on file.   Social History Main Topics  . Smoking status: Never Smoker  . Smokeless tobacco: Never Used  . Alcohol use No  . Drug use: No  . Sexual activity: Not on file   Other Topics Concern  . Not on file   Social History Narrative  . No narrative on file    History reviewed. No pertinent surgical history.   Marland Kitchen. aspirin EC  81 mg Oral QODAY    . atorvastatin  40 mg Oral Daily  . cetirizine HCl  5 mg Oral Daily  . donepezil  5 mg Oral QHS  . dorzolamide  1 drop Both Eyes BID   And  . timolol  1 drop Both Eyes BID  . enoxaparin (LOVENOX) injection  40 mg Subcutaneous Daily  . fluticasone  1 spray Each Nare Daily  . furosemide  40 mg Intravenous Daily  . insulin glargine  18 Units Subcutaneous Daily  . irbesartan  300 mg Oral Daily  . latanoprost  1 drop Both Eyes QHS  . levothyroxine  75 mcg Oral QAC breakfast  . metoprolol tartrate  25 mg Oral BID  . sodium chloride flush  3 mL Intravenous Q12H     Physical Exam: Blood pressure (!) 147/58, pulse (!) 57, temperature 97.8 F (36.6 C), temperature source Oral, resp. rate 18, height 5\' 5"  (1.651 m), weight 188 lb 6.4 oz (85.5 kg), SpO2 92 %.   Affect appropriate Overweight white female  HEENT: normal Neck supple with no adenopathy JVP normal no bruits no thyromegaly Lungs clear with no wheezing and good diaphragmatic motion Heart:  S1/S2 no murmur, no rub, gallop or click PMI normal Abdomen: benighn, BS positve, no tenderness, no AAA no bruit.  No HSM or HJR Distal  pulses intact with no bruits No edema Neuro non-focal Skin warm and dry No muscular weakness   Labs:   Lab Results  Component Value Date   WBC 7.2 02/23/2016   HGB 10.3 (L) 02/23/2016   HCT 31.6 (L) 02/23/2016   MCV 97.2 02/23/2016   PLT 151 02/23/2016    Recent Labs Lab 02/21/16 2248  02/23/16 0302  NA 141  < > 139  K 3.6  < > 3.7  CL 106  < > 101  CO2 28  < > 30  BUN 35*  < > 36*  CREATININE 1.54*  < > 1.57*  CALCIUM 9.0  < > 8.4*  PROT 6.3*  --   --   BILITOT 0.7  --   --   ALKPHOS 48  --   --   ALT 19  --   --   AST 27  --   --   GLUCOSE 94  < > 67  < > = values in this interval not displayed. Lab Results  Component Value Date   CKTOTAL 76 04/08/2011   CKMB 1.4 04/08/2011   TROPONINI 0.21 (HH) 02/22/2016     Radiology: Dg Chest 2 View  Result Date:  02/21/2016 CLINICAL DATA:  Initial evaluation for acute dyspnea, peripheral edema, teeth kidney a. EXAM: CHEST  2 VIEW COMPARISON:  None available. FINDINGS: Mild to moderate cardiomegaly. Mediastinal silhouette within normal limits. Extensive atherosclerotic plaque within the aorta. Lungs are normally inflated. Perihilar vascular congestion with diffuse interstitial prominence. Few scattered Kerley B-lines present at the lung bases. Findings compatible pulmonary interstitial edema. Small bilateral pleural effusions. No focal infiltrates. No pneumothorax. Compression deformity at the T12 vertebral body, favored to be chronic. No definite acute osseous abnormality. Bones are diffusely osteopenia. IMPRESSION: 1. Cardiomegaly with mild diffuse pulmonary interstitial edema and small bilateral pleural effusions, consistent with acute CHF exacerbation. 2. Extensive atherosclerosis. 3. Wedge deformity of the T12 vertebral body, favored to be chronic in nature. Correlation with history and physical exam recommended. Electronically Signed   By: Rise MuBenjamin  McClintock M.D.   On: 02/21/2016 19:37    EKG NSR nonspecific ST chagnes    ASSESSMENT AND PLAN:   CHF:  Reviewed echo EF normal elevated EDP and abnormal diastolic function. Improved with diuresis  Change to PO.  Continue beta blocker and ARB   Troponin: no evolution no chest pain no acute ECG changes can consider outpatient [facility]   HTN:  Well controlled.  Continue current medications and low sodium Dash type diet.    Chol:  On statin   Signed: Charlton Hawseter Lilyian Quayle 02/23/2016, 11:39 AM

## 2016-02-24 DIAGNOSIS — E876 Hypokalemia: Secondary | ICD-10-CM

## 2016-02-24 DIAGNOSIS — I5031 Acute diastolic (congestive) heart failure: Secondary | ICD-10-CM

## 2016-02-24 LAB — BASIC METABOLIC PANEL
ANION GAP: 9 (ref 5–15)
BUN: 32 mg/dL — AB (ref 6–20)
CALCIUM: 8.7 mg/dL — AB (ref 8.9–10.3)
CO2: 31 mmol/L (ref 22–32)
Chloride: 102 mmol/L (ref 101–111)
Creatinine, Ser: 1.54 mg/dL — ABNORMAL HIGH (ref 0.44–1.00)
GFR calc Af Amer: 34 mL/min — ABNORMAL LOW (ref >60)
GFR, EST NON AFRICAN AMERICAN: 29 mL/min — AB (ref >60)
GLUCOSE: 75 mg/dL (ref 65–99)
Potassium: 3.2 mmol/L — ABNORMAL LOW (ref 3.5–5.1)
Sodium: 142 mmol/L (ref 135–145)

## 2016-02-24 LAB — GLUCOSE, CAPILLARY
GLUCOSE-CAPILLARY: 112 mg/dL — AB (ref 65–99)
Glucose-Capillary: 62 mg/dL — ABNORMAL LOW (ref 65–99)
Glucose-Capillary: 96 mg/dL (ref 65–99)
Glucose-Capillary: 188 mg/dL — ABNORMAL HIGH (ref 65–99)
Glucose-Capillary: 188 mg/dL — ABNORMAL HIGH (ref 65–99)

## 2016-02-24 MED ORDER — INSULIN GLARGINE 100 UNIT/ML ~~LOC~~ SOLN
15.0000 [IU] | Freq: Every day | SUBCUTANEOUS | Status: DC
  Administered 2016-02-25: 15 [IU] via SUBCUTANEOUS
  Filled 2016-02-24: qty 0.15

## 2016-02-24 MED ORDER — ENOXAPARIN SODIUM 30 MG/0.3ML ~~LOC~~ SOLN
30.0000 mg | Freq: Every day | SUBCUTANEOUS | Status: DC
  Administered 2016-02-25: 30 mg via SUBCUTANEOUS
  Filled 2016-02-24: qty 0.3

## 2016-02-24 MED ORDER — POTASSIUM CHLORIDE CRYS ER 20 MEQ PO TBCR
40.0000 meq | EXTENDED_RELEASE_TABLET | Freq: Once | ORAL | Status: AC
  Administered 2016-02-24: 40 meq via ORAL
  Filled 2016-02-24: qty 2

## 2016-02-24 NOTE — Progress Notes (Signed)
Patient ID: Bethany OhmsMargie M Lizardo, female   DOB: 02/26/1927, 82 y.o.   MRN: [id number]   Patient Name: Bethany Nelson Date of Encounter: 02/24/2016  [facility]  [facility] Problem List     Principal Problem:   New onset of congestive heart failure (HCC) Active Problems:   HTN (hypertension)   DM2 (diabetes mellitus, type 2) (HCC)   CKD stage 3 due to type 2 diabetes mellitus (HCC)     Subjective   No dyspnea improved   Inpatient Medications    Scheduled Meds: . aspirin EC  81 mg Oral QODAY  . atorvastatin  40 mg Oral Daily  . cetirizine HCl  5 mg Oral Daily  . donepezil  5 mg Oral QHS  . dorzolamide  1 drop Both Eyes BID   And  . timolol  1 drop Both Eyes BID  . enoxaparin (LOVENOX) injection  40 mg Subcutaneous Daily  . fluticasone  1 spray Each Nare Daily  . furosemide  20 mg Oral Daily  . [START ON 02/25/2016] insulin glargine  15 Units Subcutaneous Daily  . irbesartan  300 mg Oral Daily  . latanoprost  1 drop Both Eyes QHS  . levothyroxine  75 mcg Oral QAC breakfast  . metoprolol tartrate  25 mg Oral BID  . sodium chloride flush  3 mL Intravenous Q12H   Continuous Infusions:  PRN Meds: sodium chloride, acetaminophen, albuterol, ondansetron (ZOFRAN) IV, sodium chloride flush   Vital Signs    Vitals:   02/23/16 1600 02/23/16 2017 02/23/16 2329 02/24/16 0421  BP: (!) 134/47 (!) 138/51 (!) 143/67 (!) 147/56  Pulse: (!) 55 (!) 59  (!) 51  Resp: 18 18 16 16   Temp: 98.1 F (36.7 C) 97.8 F (36.6 C) 98 F (36.7 C) 98.3 F (36.8 C)  TempSrc: Oral Oral Oral Oral  SpO2: 94% 92% 94% 92%  Weight:    185 lb 4.8 oz (84.1 kg)  Height:        Intake/Output Summary (Last 24 hours) at 02/24/16 1118 Last data filed at 02/24/16 1056  Gross per 24 hour  Intake              480 ml  Output             1600 ml  Net            -1120 ml   Filed Weights   02/22/16 0033 02/23/16 0500 02/24/16 0421  Weight: 190 lb 11.2 oz (86.5 kg) 188 lb 6.4 oz (85.5 kg)  185 lb 4.8 oz (84.1 kg)    Physical Exam    GEN: Well nourished, well developed, in no acute distress.  HEENT: Grossly normal.  Neck: Supple, no JVD, carotid bruits, or masses. Cardiac: RRR, no murmurs, rubs, or gallops. No clubbing, cyanosis, edema.  Radials/DP/PT 2+ and equal bilaterally.  Respiratory:  Respirations regular and unlabored, clear to auscultation bilaterally. GI: Soft, nontender, nondistended, BS + x 4. MS: no deformity or atrophy. Skin: warm and dry, no rash. Neuro:  Strength and sensation are intact. Psych: AAOx3.  Normal affect.  Labs    CBC  Recent Labs  02/21/16 2248 02/21/16 2254 02/23/16 0302  WBC 9.8  --  7.2  NEUTROABS 7.8*  --   --   HGB 11.5* 11.2* 10.3*  HCT 36.4 33.0* 31.6*  MCV 97.8  --  97.2  PLT 137*  --  151   Basic Metabolic Panel  Recent Labs  02/23/16 0302 02/24/16  0459  NA 139 142  K 3.7 3.2*  CL 101 102  CO2 30 31  GLUCOSE 67 75  BUN 36* 32*  CREATININE 1.57* 1.54*  CALCIUM 8.4* 8.7*   Liver Function Tests  Recent Labs  02/21/16 2248  AST 27  ALT 19  ALKPHOS 48  BILITOT 0.7  PROT 6.3*  ALBUMIN 3.0*   Cardiac Enzymes  Recent Labs  02/21/16 2248 02/22/16 0543 02/22/16 1056  TROPONINI 0.16* 0.21* 0.21*     Telemetry    NSR 02/24/2016  - Personally Reviewed  ECG    SR PR 220 no acute ST/T wave changes   - Personally Reviewed  Radiology    No results found.  Cardiac Studies   Echo EF 60-65%    Assessment & Plan    CHF: ? Diastolic EF normal by echo euvolemic good diuresis weight down 5 lbs on lasix 20 po daily Troponin: no evolution no acute ECG changes no chest pain echo with normal EF no RWMA;s no further W/u indicated  Can f/u with primary in [place] ? Dc today or am will sign off   Signed, Charlton HawsPeter Quinnlyn Hearns, MD  02/24/2016, 11:18 AM

## 2016-02-24 NOTE — Evaluation (Signed)
Occupational Therapy Evaluation Patient Details Name: Bethany Nelson MRN: [id number] DOB: 08/19/1926 Today's Date: 02/24/2016    History of Present Illness 82 y.o. female with medical history significant of DM, HTN.  Patient presents to the ED with c/o SOB for the past 3-4 days   Clinical Impression   Pt. Is motivated to work with OT to maximize I and safety with ADLs and mobility. Pt. Was Mod I at home and wants to regain function. Pt. Is currently S to Min guard assist with ADLs and mobility. Pt. Would benefit from further skilled OT to maximize I and safety to decrease risk of falls.  Pt. Reports multiple falls at home     Follow Up Recommendations       Equipment Recommendations  None recommended by OT    Recommendations for Other Services       Precautions / Restrictions Precautions Precautions: Fall Precaution Comments: watch O2 saturations Restrictions Weight Bearing Restrictions: No      Mobility Bed Mobility         Supine to sit: Modified independent (Device/Increase time)        Transfers       Sit to Stand: Min guard Stand pivot transfers: Min guard            Balance     Sitting balance-Leahy Scale: Good       Standing balance-Leahy Scale: Fair                              ADL Overall ADL's : Needs assistance/impaired Eating/Feeding: Independent   Grooming: Wash/dry hands;Wash/dry face;Oral care;Min guard;Standing   Upper Body Bathing: Set up;Sitting   Lower Body Bathing: Min guard;Sit to/from stand   Upper Body Dressing : Set up;Sitting   Lower Body Dressing: Min guard;Sit to/from stand   Toilet Transfer: Min guard;Ambulation;BSC   Toileting- ArchitectClothing Manipulation and Hygiene: Min guard;Sit to/from stand       Functional mobility during ADLs: Min guard;Rolling walker General ADL Comments: Pt. reports she was Mod I with ADLs at home.  Pt. wants to return to baseline function.     Vision      Perception     Praxis      Pertinent Vitals/Pain Pain Assessment: No/denies pain     Hand Dominance Right   Extremity/Trunk Assessment Upper Extremity Assessment Upper Extremity Assessment: Generalized weakness           Communication Communication Communication: HOH   Cognition Arousal/Alertness: Awake/alert Behavior During Therapy: WFL for tasks assessed/performed Overall Cognitive Status: History of cognitive impairments - at baseline                 General Comments: some memory deficits notecd   General Comments       Exercises       Shoulder Instructions      Home Living Family/patient expects to be discharged to:: Private residence Living Arrangements: Children Available Help at Discharge: Family Type of Home: House       Home Layout: One level     Bathroom Shower/Tub: Walk-in shower;Door Shower/tub characteristics: Door Allied Waste IndustriesBathroom Toilet: Standard Bathroom Accessibility: Yes How Accessible: Accessible via walker Home Equipment: Walker - 4 wheels;Shower seat - built in          Prior Functioning/Environment Level of Independence: Independent with assistive device(s)        Comments: Pt. reheats food but daughter does majority of cooking.  OT Problem List: Decreased strength   OT Treatment/Interventions: Self-care/ADL training;Therapeutic exercise;Neuromuscular education;DME and/or AE instruction;Therapeutic activities;Patient/family education    OT Goals(Current goals can be found in the care plan section) Acute Rehab OT Goals Patient Stated Goal: to go home OT Goal Formulation: With patient Time For Goal Achievement: 03/09/16 Potential to Achieve Goals: Good ADL Goals Pt Will Perform Grooming: with modified independence;standing Pt Will Perform Upper Body Bathing: with modified independence;sitting Pt Will Perform Lower Body Bathing: with modified independence;sit to/from stand Pt Will Perform Upper Body Dressing:  with modified independence;sitting Pt Will Perform Lower Body Dressing: with modified independence;sit to/from stand Pt Will Transfer to Toilet: with modified independence;ambulating Pt Will Perform Toileting - Clothing Manipulation and hygiene: with modified independence;sit to/from stand Pt Will Perform Tub/Shower Transfer: Shower transfer;ambulating;shower seat  OT Frequency: Min 2X/week   Barriers to D/C:            Co-evaluation              End of Session Equipment Utilized During Treatment: Gait belt;Rolling walker  Activity Tolerance: Patient tolerated treatment well Patient left: in chair;with call bell/phone within reach   Time: [id number] OT Time Calculation (min): 43 min Charges:  OT General Charges $OT Visit: 1 Procedure OT Evaluation $OT Eval Moderate Complexity: 1 Procedure OT Treatments $Self Care/Home Management : 23-37 mins G-Codes:    Kelvin Burpee 02/24/2016, 9:24 AM

## 2016-02-24 NOTE — Plan of Care (Signed)
Problem: Pain Managment: Goal: General experience of comfort will improve Outcome: Progressing Denies pain  Problem: Bowel/Gastric: Goal: Will not experience complications related to bowel motility Outcome: Progressing Denies gastric and bowel issues

## 2016-02-24 NOTE — Progress Notes (Signed)
Pt ambulated in the hallway with daughter using rolling walker.

## 2016-02-24 NOTE — Progress Notes (Addendum)
PROGRESS NOTE  Bethany Nelson  UJW:[id number] DOB: 12/27/1926  DOA: 02/21/2016 PCP: [name], MD   Brief Narrative:  82 year old female with PMH of DM, HTN, HLD, dementia and hypothyroidism presented to ED with worsening dyspnea of 3-4 days duration, peripheral edema ongoing for a couple of months. Denied chest pain. Admitted for CHF exacerbation. Improving.   Assessment & Plan:   Principal Problem:   New onset of congestive heart failure (HCC) Active Problems:   HTN (hypertension)   DM2 (diabetes mellitus, type 2) (HCC)   CKD stage 3 due to type 2 diabetes mellitus (HCC)   1. Acute diastolic CHF: Started on Lasix 40 mg IV daily. Continue ARB. Hold HCTZ while on IV Lasix. - 2180 mL since admission. 2-D echo shows normal systolic function but grade 3 diastolic dysfunction. Cardiology input 12/16 appreciated. Transitioned to oral Lasix. 2. Elevated troponin: May be demand ischemia from CHF. No chest pain reported. Flat trend. Continue aspirin, statins, beta blockers and ARB. 2-D echo results as below. As per cardiology consultation, can consider outpatient Myoview with primary in [facility] 3. Essential hypertension: Reasonable inpatient control. 4. Type II DM/IDDM: Had hypoglycemic episode at fasting CBG this morning. We'll reduce Lantus. Monitor closely. 5. Stage III chronic kidney disease: Baseline creatinine not known. Last creatinine in system was on 04/29/11:1.17. Admitted with creatinine of 1.54. This may be her baseline. Creatinine stable in the 1.5 range. 6. Hyperlipidemia: Continue statins. 7. Hypothyroid: Continue Synthroid. 8. Anemia and thrombocytopenia: Thrombocytopenia resolved. Slight drop in hemoglobin to 10.3 from 11. Follow CBC in a.m. 9. Hypokalemia: Replace and follow. 10. Dementia: No acute mental status changes. Patient states that she lives with her daughter and son-in-law.   DVT prophylaxis: Lovenox Code Status: Full Family Communication:  None at bedside Disposition Plan: DC home possibly 02/25/16.   Consultants:   Cardiology  Procedures:   2-D echo 02/22/16: Study Conclusions  - Left ventricle: The cavity size was normal. There was moderate   concentric hypertrophy. Systolic function was normal. Wall motion   was normal; there were no regional wall motion abnormalities.   Doppler parameters are consistent with a reversible restrictive   pattern, indicative of decreased left ventricular diastolic   compliance and/or increased left atrial pressure (grade 3   diastolic dysfunction). Doppler parameters are consistent with   high ventricular filling pressure. - Aortic valve: Transvalvular velocity was within the normal range.   There was no stenosis. Valve area (VTI): 1.56 cm^2. Valve area   (Vmax): 1.53 cm^2. Valve area (Vmean): 1.56 cm^2. - Mitral valve: Transvalvular velocity was within the normal range.   There was no evidence for stenosis. There was mild regurgitation. - Left atrium: The atrium was mildly dilated. - Right ventricle: The cavity size was normal. Wall thickness was   normal. Systolic function was normal. - Tricuspid valve: There was trivial regurgitation. - Pulmonary arteries: Systolic pressure was within the normal   range. PA peak pressure: 34 mm Hg (S).  Antimicrobials:   None    Subjective: Intermittent mild runny nose. Dyspnea and leg swelling improved. Working with OT this morning. No chest pain or other complaints reported.  Objective:  Vitals:   02/23/16 1600 02/23/16 2017 02/23/16 2329 02/24/16 0421  BP: (!) 134/47 (!) 138/51 (!) 143/67 (!) 147/56  Pulse: (!) 55 (!) 59  (!) 51  Resp: 18 18 16 16   Temp: 98.1 F (36.7 C) 97.8 F (36.6 C) 98 F (36.7 C) 98.3 F (36.8 C)  TempSrc:  Oral Oral Oral Oral  SpO2: 94% 92% 94% 92%  Weight:    84.1 kg (185 lb 4.8 oz)  Height:        Intake/Output Summary (Last 24 hours) at 02/24/16 1050 Last data filed at 02/24/16 0145  Gross per  24 hour  Intake              480 ml  Output             1400 ml  Net             -920 ml   Filed Weights   02/22/16 0033 02/23/16 0500 02/24/16 0421  Weight: 86.5 kg (190 lb 11.2 oz) 85.5 kg (188 lb 6.4 oz) 84.1 kg (185 lb 4.8 oz)    Examination:  General exam: Pleasant elderly female ambulating this morning with a walker and OT supervision. Respiratory system: Occasional basal crackles but improved compared to 2 days ago. Rest of lung fields clear to auscultation. Respiratory effort normal. Cardiovascular system: S1 & S2 heard, RRR. No JVD, murmurs, rubs, gallops or clicks. Trace pedal edema. Telemetry: Sinus bradycardia in the 50s. Gastrointestinal system: Abdomen is nondistended, soft and nontender. No organomegaly or masses felt. Normal bowel sounds heard. Central nervous system: Alert and oriented to person and place. No focal neurological deficits. Extremities: Symmetric 5 x 5 power. Skin: No rashes, lesions or ulcers Psychiatry: Judgement and insight appear impaired. Mood & affect appropriate.     Data Reviewed: I have personally reviewed following labs and imaging studies  CBC:  Recent Labs Lab 02/21/16 2248 02/21/16 2254 02/23/16 0302  WBC 9.8  --  7.2  NEUTROABS 7.8*  --   --   HGB 11.5* 11.2* 10.3*  HCT 36.4 33.0* 31.6*  MCV 97.8  --  97.2  PLT 137*  --  151   Basic Metabolic Panel:  Recent Labs Lab 02/21/16 2248 02/21/16 2254 02/22/16 0543 02/23/16 0302 02/24/16 0459  NA 141 141 141 139 142  K 3.6 3.7 3.3* 3.7 3.2*  CL 106 105 104 101 102  CO2 28  --  31 30 31   GLUCOSE 94 86 93 67 75  BUN 35* 36* 34* 36* 32*  CREATININE 1.54* 1.40* 1.46* 1.57* 1.54*  CALCIUM 9.0  --  8.6* 8.4* 8.7*   GFR: Estimated Creatinine Clearance: 27.5 mL/min (by C-G formula based on SCr of 1.54 mg/dL (H)). Liver Function Tests:  Recent Labs Lab 02/21/16 2248  AST 27  ALT 19  ALKPHOS 48  BILITOT 0.7  PROT 6.3*  ALBUMIN 3.0*   No results for input(s): LIPASE,  AMYLASE in the last 168 hours. No results for input(s): AMMONIA in the last 168 hours. Coagulation Profile: No results for input(s): INR, PROTIME in the last 168 hours. Cardiac Enzymes:  Recent Labs Lab 02/21/16 2248 02/22/16 0543 02/22/16 1056  TROPONINI 0.16* 0.21* 0.21*   BNP (last 3 results) No results for input(s): PROBNP in the last 8760 hours. HbA1C: No results for input(s): HGBA1C in the last 72 hours. CBG:  Recent Labs Lab 02/23/16 1132 02/23/16 1548 02/23/16 2142 02/24/16 0559 02/24/16 0655  GLUCAP 116* 131* 204* 62* 96   Lipid Profile: No results for input(s): CHOL, HDL, LDLCALC, TRIG, CHOLHDL, LDLDIRECT in the last 72 hours. Thyroid Function Tests: No results for input(s): TSH, T4TOTAL, FREET4, T3FREE, THYROIDAB in the last 72 hours. Anemia Panel: No results for input(s): VITAMINB12, FOLATE, FERRITIN, TIBC, IRON, RETICCTPCT in the last 72 hours.  Sepsis Labs: No results  for input(s): PROCALCITON, LATICACIDVEN in the last 168 hours.  No results found for this or any previous visit (from the past 240 hour(s)).       Radiology Studies: No results found.      Scheduled Meds: . aspirin EC  81 mg Oral QODAY  . atorvastatin  40 mg Oral Daily  . cetirizine HCl  5 mg Oral Daily  . donepezil  5 mg Oral QHS  . dorzolamide  1 drop Both Eyes BID   And  . timolol  1 drop Both Eyes BID  . enoxaparin (LOVENOX) injection  40 mg Subcutaneous Daily  . fluticasone  1 spray Each Nare Daily  . furosemide  20 mg Oral Daily  . insulin glargine  18 Units Subcutaneous Daily  . irbesartan  300 mg Oral Daily  . latanoprost  1 drop Both Eyes QHS  . levothyroxine  75 mcg Oral QAC breakfast  . metoprolol tartrate  25 mg Oral BID  . sodium chloride flush  3 mL Intravenous Q12H   Continuous Infusions:   LOS: 3 days      Lyfe Reihl, MD [facility] Pager [phone]  If 7PM-7AM, please contact night-coverage [email or web address] Password  [email or web address] 02/24/2016, 10:50 AM

## 2016-02-25 LAB — BASIC METABOLIC PANEL
Anion gap: 7 (ref 5–15)
BUN: 34 mg/dL — ABNORMAL HIGH (ref 6–20)
CO2: 31 mmol/L (ref 22–32)
Calcium: 8.6 mg/dL — ABNORMAL LOW (ref 8.9–10.3)
Chloride: 104 mmol/L (ref 101–111)
Creatinine, Ser: 1.58 mg/dL — ABNORMAL HIGH (ref 0.44–1.00)
GFR, EST AFRICAN AMERICAN: 33 mL/min — AB (ref >60)
GFR, EST NON AFRICAN AMERICAN: 28 mL/min — AB (ref >60)
Glucose, Bld: 103 mg/dL — ABNORMAL HIGH (ref 65–99)
POTASSIUM: 4.1 mmol/L (ref 3.5–5.1)
SODIUM: 142 mmol/L (ref 135–145)

## 2016-02-25 LAB — CBC
HEMATOCRIT: 33.6 % — AB (ref 36.0–46.0)
HEMOGLOBIN: 10.5 g/dL — AB (ref 12.0–15.0)
MCH: 30.6 pg (ref 26.0–34.0)
MCHC: 31.3 g/dL (ref 30.0–36.0)
MCV: 98 fL (ref 78.0–100.0)
Platelets: 155 10*3/uL (ref 150–400)
RBC: 3.43 MIL/uL — AB (ref 3.87–5.11)
RDW: 14.4 % (ref 11.5–15.5)
WBC: 5.8 10*3/uL (ref 4.0–10.5)

## 2016-02-25 LAB — GLUCOSE, CAPILLARY
Glucose-Capillary: 95 mg/dL (ref 65–99)
Glucose-Capillary: 131 mg/dL — ABNORMAL HIGH (ref 65–99)

## 2016-02-25 MED ORDER — FUROSEMIDE 20 MG PO TABS: 20.0000 mg | ORAL_TABLET | Freq: Every day | ORAL | 0 refills | Status: DC

## 2016-02-25 NOTE — Care Management Important Message (Signed)
Important Message  Patient Details  Name: Bethany Nelson MRN: [id number] Date of Birth: 09/13/1926   Medicare Important Message Given:  Yes    Jacobs Golab Stefan ChurchBratton 02/25/2016, 11:32 AM

## 2016-02-25 NOTE — Progress Notes (Signed)
Occupational Therapy Treatment Patient Details Name: Bethany Nelson MRN: [id number] DOB: 03/10/1926 Today's Date: 02/25/2016    History of present illness 82 y.o. female with medical history significant of DM, HTN.  Patient presents to the ED with c/o SOB for the past 3-4 days   OT comments  Pt making progress with functional goals. Pt to d/c home with her daughter this afternoon  Follow Up Recommendations  No OT follow up;Supervision - Intermittent    Equipment Recommendations  None recommended by OT    Recommendations for Other Services      Precautions / Restrictions Precautions Precautions: Fall Precaution Comments: watch O2 saturations Restrictions Weight Bearing Restrictions: No       Mobility Bed Mobility Overal bed mobility: Needs Assistance Bed Mobility: Supine to Sit     Supine to sit: Modified independent (Device/Increase time)     General bed mobility comments: pt up in recliner  Transfers Overall transfer level: Needs assistance Equipment used: Rolling walker (2 wheeled) Transfers: Sit to/from Stand Sit to Stand: Min guard         General transfer comment: cues for hand placement, min guard for safety, performed from recliner and toilet.     Balance Overall balance assessment: Needs assistance Sitting-balance support: No upper extremity supported Sitting balance-Leahy Scale: Good     Standing balance support: Bilateral upper extremity supported;During functional activity Standing balance-Leahy Scale: Fair Standing balance comment: using rw for support with ambulation                   ADL Overall ADL's : Needs assistance/impaired     Grooming: Wash/dry hands;Wash/dry face;Oral care;Standing;Supervision/safety;Set up   Upper Body Bathing: Standing;Set up;Supervision/ safety   Lower Body Bathing: Min guard;Sit to/from stand   Upper Body Dressing : Supervision/safety;Set up;Standing   Lower Body Dressing: Min guard;Sit to/from  stand   Toilet Transfer: Min guard;Ambulation;RW;Comfort height toilet;Grab bars   Toileting- Clothing Manipulation and Hygiene: Supervision/safety;Sit to/from stand   Tub/ Shower Transfer: 3 in 1;Grab bars;Rolling walker;Ambulation;Min guard   Functional mobility during ADLs: Min guard                                    Cognition   Behavior During Therapy: WFL for tasks assessed/performed Overall Cognitive Status: Within Functional Limits for tasks assessed                       Extremity/Trunk Assessment   WFL                        General Comments      Pertinent Vitals/ Pain       Pain Assessment: No/denies pain                                                          Frequency  Min 2X/week        Progress Toward Goals  OT Goals(current goals can now be found in the care plan section)  Progress towards OT goals: Progressing toward goals  Acute Rehab OT Goals Patient Stated Goal: to go home  Plan Discharge plan remains appropriate  End of Session Equipment Utilized During Treatment: Gait belt;Rolling walker   Activity Tolerance Patient tolerated treatment well   Patient Left in chair;with call bell/phone within reach             Time: 0926-0944 OT Time Calculation (min): 18 min  Charges: OT General Charges $OT Visit: 1 Procedure OT Treatments $Self Care/Home Management : 8-22 mins  Galen ManilaSpencer, Abdulahi Schor Jeanette 02/25/2016, 11:46 AM

## 2016-02-25 NOTE — Progress Notes (Signed)
All d/c instructions explained and given to pt and her daughter at the bedside.  Verbalized understanding.  D/c off floor at 1426 to awaiting transport.  Amanda PeaNellie Melana Hingle, Charity fundraiserN.

## 2016-02-25 NOTE — Progress Notes (Signed)
Physical Therapy Treatment Patient Details Name: Bethany Nelson MRN: [id number] DOB: 02/28/1927 Today's Date: 02/25/2016    History of Present Illness 82 y.o. female with medical history significant of DM, HTN.  Patient presents to the ED with c/o SOB for the past 3-4 days    PT Comments    Pt motivated to get home. She confirms that she will be staying with her daughter and son-in-law. She will be alone for periods of time but her daughter can come home during the day if needed. Recommending HHPT services to follow after acute stay. PT to continue to follow as tolerated.   Follow Up Recommendations  Home health PT;Supervision for mobility/OOB     Equipment Recommendations  None recommended by PT    Recommendations for Other Services       Precautions / Restrictions Precautions Precautions: Fall Restrictions Weight Bearing Restrictions: No    Mobility  Bed Mobility Overal bed mobility: Needs Assistance Bed Mobility: Supine to Sit     Supine to sit: Modified independent (Device/Increase time)     General bed mobility comments: using rail to assist  Transfers Overall transfer level: Needs assistance Equipment used: Rolling walker (2 wheeled) Transfers: Sit to/from Stand Sit to Stand: Min guard         General transfer comment: cues for hand placement, min guard for safety, performed from bed and toilet.   Ambulation/Gait Ambulation/Gait assistance: Supervision Ambulation Distance (Feet): 110 Feet Assistive device: Rolling walker (2 wheeled) Gait Pattern/deviations: Step-through pattern;Trunk flexed Gait velocity: decreased   General Gait Details: steady pattern, no loss of balance   Stairs Stairs:  (pt denies any steps at home)          Wheelchair Mobility    Modified Rankin (Stroke Patients Only)       Balance Overall balance assessment: Needs assistance Sitting-balance support: No upper extremity supported Sitting balance-Leahy Scale:  Good     Standing balance support: Bilateral upper extremity supported;During functional activity Standing balance-Leahy Scale: Fair Standing balance comment: using rw for support with ambulation                    Cognition Arousal/Alertness: Awake/alert Behavior During Therapy: WFL for tasks assessed/performed Overall Cognitive Status: History of cognitive impairments - at baseline                      Exercises      General Comments        Pertinent Vitals/Pain Pain Assessment: No/denies pain    Home Living                      Prior Function            PT Goals (current goals can now be found in the care plan section) Acute Rehab PT Goals Patient Stated Goal: to go home PT Goal Formulation: With patient/family Time For Goal Achievement: 03/07/16 Potential to Achieve Goals: Good Progress towards PT goals: Progressing toward goals    Frequency    Min 3X/week      PT Plan Current plan remains appropriate    Co-evaluation             End of Session Equipment Utilized During Treatment: Gait belt;Oxygen Activity Tolerance: Patient tolerated treatment well Patient left: in chair;with call bell/phone within reach     Time: 0850-0914 PT Time Calculation (min) (ACUTE ONLY): 24 min  Charges:  $Gait Training: 8-22 mins $Therapeutic Activity:  8-22 mins                    G Codes:      Christiane HaBenjamin J. Kenyatte Chatmon, PT, CSCS Pager [phone] Office [phone]  02/25/2016, 11:09 AM

## 2016-02-25 NOTE — Discharge Summary (Signed)
Physician Discharge Summary  Bethany Nelson ZOX:[id number] DOB: 1926/04/01  PCP: Jerl Mina, MD  Admit date: 02/21/2016 Discharge date: 02/25/2016  Recommendations for Outpatient Follow-up:  1. Dr. Jerl Mina, PCP in 4 days with repeat labs (CBC & BMP). Office will call patient with appointment. 2. Consider outpatient Cardiology consultation and follow-up.  Home Health: PT and RN  Equipment/Devices: None    Discharge Condition: Improved and stable  CODE STATUS: Full  Diet recommendation: Heart healthy and diabetic diet.  Discharge Diagnoses:  Principal Problem:   New onset of congestive heart failure (HCC) Active Problems:   HTN (hypertension)   DM2 (diabetes mellitus, type 2) (HCC)   CKD stage 3 due to type 2 diabetes mellitus (HCC)   Brief/Interim Summary: 82 year old female with PMH of DM, HTN, HLD, dementia and hypothyroidism presented to ED with worsening dyspnea of 3-4 days duration, peripheral edema ongoing for a couple of months. Denied chest pain. Admitted for CHF exacerbation. Cardiology was consulted.   Assessment & Plan:   1. Acute diastolic CHF:  she was initially treated with IV Lasix with good effect. She is -2.8 L since admission. After her volume status improved, she was transitioned to Lasix 20 mg daily. Discontinued HCTZ indefinitely while she is on Lasix and also due to concern for hyponatremia in this elderly frail female patient. Continued ARB. 2-D echo shows normal systolic function but grade 3 diastolic dysfunction. Cardiology was consulted and assisted with management. They have cleared her for discharge and recommend follow-up with her primary physician in [place]. 2. Elevated troponin: May be demand ischemia from CHF. No chest pain reported. Flat trend. Continue aspirin, statins, beta blockers and ARB. 2-D echo results as below. As per cardiology consultation, can consider outpatient Myoview with primary in [place] 3. Essential hypertension: Reasonable inpatient control given her advanced age. 4. Type II DM/IDDM: Had hypoglycemic episode at fasting CBG 12/17 morning. Lantus had been reduced in the hospital but will continue home dose at discharge. She wasn't eating as much in the hospital because she did not like the food. As per discussion with her daughter, A1c was recently done as outpatient and her physician increased the Lantus from 18 units at bedtime to 22 units daily. Advised daughter to monitor CBGs closely and reduce insulin if has hypoglycemic episode. She verbalized understanding. 5. Stage III chronic kidney disease: Baseline creatinine not known. Last creatinine in system was on 04/29/11:1.17. Admitted with creatinine of 1.54. This may be her baseline. Creatinine stable in the 1.5 range. 6. Hyperlipidemia: Continue statins. 7. Hypothyroid: Continue Synthroid. 8. Anemia and thrombocytopenia: Thrombocytopenia resolved. Slight drop in hemoglobin to 10.3 from 11. Hemoglobin stable in the 10 g range. 9. Hypokalemia: Replaced 10. Dementia: No acute mental status changes. Patient states that she lives with her daughter and son-in-law.   Consultants:   Cardiology  Procedures:   2-D echo 02/22/16: Study Conclusions  - Left ventricle: The cavity size was normal. There was moderate concentric hypertrophy. Systolic function was normal. Wall motion was normal; there were no regional wall motion abnormalities. Doppler parameters are consistent with a reversible restrictive pattern, indicative of decreased left ventricular diastolic compliance and/or increased left atrial pressure (grade 3 diastolic dysfunction). Doppler parameters are consistent with high ventricular filling pressure. - Aortic valve: Transvalvular velocity was within the normal range. There was no stenosis. Valve area (VTI): 1.56 cm^2. Valve area (Vmax): 1.53 cm^2. Valve area (Vmean): 1.56 cm^2. - Mitral  valve: Transvalvular velocity was within the normal range.  There was no evidence for stenosis. There was mild regurgitation. - Left atrium: The atrium was mildly dilated. - Right ventricle: The cavity size was normal. Wall thickness was normal. Systolic function was normal. - Tricuspid valve: There was trivial regurgitation. - Pulmonary arteries: Systolic pressure was within the normal range. PA peak pressure: 34 mm Hg (S).   Discharge Instructions  Discharge Instructions    (HEART FAILURE PATIENTS) Call MD:  Anytime you have any of the following symptoms: 1) 3 pound weight gain in 24 hours or 5 pounds in 1 week 2) shortness of breath, with or without a dry hacking cough 3) swelling in the hands, feet or stomach 4) if you have to sleep on extra pillows at night in order to breathe.    Complete by:  As directed    Call MD for:  difficulty breathing, headache or visual disturbances    Complete by:  As directed    Call MD for:  extreme fatigue    Complete by:  As directed    Call MD for:  persistant dizziness or light-headedness    Complete by:  As directed    Diet - low sodium heart healthy    Complete by:  As directed    Diet Carb Modified    Complete by:  As directed    Increase activity slowly    Complete by:  As directed        Medication List    STOP taking these medications   hydrochlorothiazide 25 MG tablet Commonly known as:  HYDRODIURIL     TAKE these medications   aspirin EC 81 MG tablet Take 81 mg by mouth every other day.   atorvastatin 40 MG tablet Commonly known as:  LIPITOR Take 40 mg by mouth daily.   cetirizine 10 MG tablet Commonly known as:  ZYRTEC Take 10 mg by mouth daily as needed for allergies.   donepezil 5 MG tablet Commonly known as:  ARICEPT Take 5 mg by mouth at bedtime.   dorzolamide-timolol 22.3-6.8 MG/ML ophthalmic solution Commonly known as:  COSOPT Place 1 drop into both eyes 2 (two) times daily.   fluticasone 50 MCG/ACT  nasal spray Commonly known as:  FLONASE Place 1 spray into both nostrils daily.   furosemide 20 MG tablet Commonly known as:  LASIX Take 1 tablet (20 mg total) by mouth daily. Start taking on:  02/26/2016   LANTUS SOLOSTAR 100 UNIT/ML Solostar Pen Generic drug:  Insulin Glargine Inject 22 Units into the skin every morning.   latanoprost 0.005 % ophthalmic solution Commonly known as:  XALATAN Place 1 drop into both eyes at bedtime.   levothyroxine 75 MCG tablet Commonly known as:  SYNTHROID, LEVOTHROID Take 75 mcg by mouth daily before breakfast.   metoprolol tartrate 25 MG tablet Commonly known as:  LOPRESSOR Take 25 mg by mouth 2 (two) times daily.   valsartan 320 MG tablet Commonly known as:  DIOVAN Take 320 mg by mouth daily.      Follow-up Information    [facility].   Why:  They will do your home health care at your home Contact information: [address] [place] [address] [phone]        [name], MD Follow up in 4 day(s).   Specialty:  Family Medicine Why:  To be seen with repeat labs (CBC & BMP). OFFICE WILL CALL PATIENT @ HOME Contact information: [address] [facility] [place]  [address] [phone]          No Known Allergies  Procedures/Studies: Dg Chest 2 View  Result Date: 02/21/2016 CLINICAL DATA:  Initial evaluation for acute dyspnea, peripheral edema, teeth kidney a. EXAM: CHEST  2 VIEW COMPARISON:  None available. FINDINGS: Mild to moderate cardiomegaly. Mediastinal silhouette within normal limits. Extensive atherosclerotic plaque within the aorta. Lungs are normally inflated. Perihilar vascular congestion with diffuse interstitial prominence. Few scattered Kerley B-lines present at the lung bases. Findings compatible pulmonary interstitial edema. Small bilateral pleural effusions. No focal infiltrates. No pneumothorax. Compression deformity at the T12 vertebral body, favored  to be chronic. No definite acute osseous abnormality. Bones are diffusely osteopenia. IMPRESSION: 1. Cardiomegaly with mild diffuse pulmonary interstitial edema and small bilateral pleural effusions, consistent with acute CHF exacerbation. 2. Extensive atherosclerosis. 3. Wedge deformity of the T12 vertebral body, favored to be chronic in nature. Correlation with history and physical exam recommended. Electronically Signed   By: Rise Mu M.D.   On: 02/21/2016 19:37      Subjective: Patient denies complaints. She is anxious to go home. Denies dyspnea or chest pain. Leg swelling has improved almost resolved. Has chronic intermittent runny nose.  Discharge Exam:  Vitals:   02/24/16 2220 02/25/16 0644 02/25/16 1041 02/25/16 1147  BP: (!) 155/57 (!) 151/47 110/60 (!) 159/48  Pulse: (!) 56 (!) 54 75 (!) 51  Resp: 18 18  16   Temp: 98.2 F (36.8 C) 98 F (36.7 C)  97.9 F (36.6 C)  TempSrc: Oral Oral  Oral  SpO2: 91% 92%  92%  Weight:  83.6 kg (184 lb 3.2 oz)    Height:        General exam: Pleasant elderly female lying comfortably supine in bed. Respiratory system: clear to auscultation. Respiratory effort normal. Cardiovascular system: S1 & S2 heard, RRR. No JVD, murmurs, rubs, gallops or clicks. Trace ankle edema. Telemetry: Sinus bradycardia in the 50s. Gastrointestinal system: Abdomen is nondistended, soft and nontender. No organomegaly or masses felt. Normal bowel sounds heard. Central nervous system: Alert and oriented to person and place. No focal neurological deficits. Extremities: Symmetric 5 x 5 power. Skin: No rashes, lesions or ulcers Psychiatry: Judgement and insight appear impaired. Mood & affect appropriate.     The results of significant diagnostics from this hospitalization (including imaging, microbiology, ancillary and laboratory) are listed below for reference.     Microbiology: No results found for this or any previous visit (from the past 240  hour(s)).   Labs: BNP (last 3 results)  Recent Labs  02/21/16 2248  BNP 749.8*   Basic Metabolic Panel:  Recent Labs Lab 02/21/16 2248 02/21/16 2254 02/22/16 0543 02/23/16 0302 02/24/16 0459 02/25/16 0459  NA 141 141 141 139 142 142  K 3.6 3.7 3.3* 3.7 3.2* 4.1  CL 106 105 104 101 102 104  CO2 28  --  31 30 31 31   GLUCOSE 94 86 93 67 75 103*  BUN 35* 36* 34* 36* 32* 34*  CREATININE 1.54* 1.40* 1.46* 1.57* 1.54* 1.58*  CALCIUM 9.0  --  8.6* 8.4* 8.7* 8.6*   Liver Function Tests:  Recent Labs Lab 02/21/16 2248  AST 27  ALT 19  ALKPHOS 48  BILITOT 0.7  PROT 6.3*  ALBUMIN 3.0*   No results for input(s): LIPASE, AMYLASE in the last 168 hours. No results for input(s): AMMONIA in the last 168 hours. CBC:  Recent Labs Lab 02/21/16 2248 02/21/16 2254 02/23/16 0302 02/25/16 0459  WBC  9.8  --  7.2 5.8  NEUTROABS 7.8*  --   --   --   HGB 11.5* 11.2* 10.3* 10.5*  HCT 36.4 33.0* 31.6* 33.6*  MCV 97.8  --  97.2 98.0  PLT 137*  --  151 155   Cardiac Enzymes:  Recent Labs Lab 02/21/16 2248 02/22/16 0543 02/22/16 1056  TROPONINI 0.16* 0.21* 0.21*   CBG:  Recent Labs Lab 02/24/16 1054 02/24/16 1609 02/24/16 2157 02/25/16 0558 02/25/16 1145  GLUCAP 112* 188* 188* 95 131*   Discussed in detail with patient's daughter. Updated care and answered questions.   Time coordinating discharge: Over 30 minutes  SIGNED:  Marcellus ScottHONGALGI,Tanelle Lanzo, MD, FACP, FHM. [facility] Pager [phone]  If 7PM-7AM, please contact night-coverage [email or web address] Password [email or web address] 02/25/2016, 1:24 PM

## 2016-02-25 NOTE — Discharge Instructions (Signed)
Heart Failure °Heart failure is a condition in which the heart has trouble pumping blood because it has become weak or stiff. This means that the heart does not pump blood efficiently for the body to work well. For some people with heart failure, fluid may back up into the lungs and there may be swelling (edema) in the lower legs. Heart failure is usually a long-term (chronic) condition. It is important for you to take good care of yourself and follow the treatment plan from your health care provider. °What are the causes? °This condition is caused by some health problems, including: °· High blood pressure (hypertension). Hypertension causes the heart muscle to work harder than normal. High blood pressure eventually causes the heart to become stiff and weak. °· Coronary artery disease (CAD). CAD is the buildup of cholesterol and fat (plaques) in the arteries of the heart. °· Heart attack (myocardial infarction). Injured tissue, which is caused by the heart attack, does not contract as well and the heart's ability to pump blood is weakened. °· Abnormal heart valves. When the heart valves do not open and close properly, the heart muscle must pump harder to keep the blood flowing. °· Heart muscle disease (cardiomyopathy or myocarditis). Heart muscle disease is damage to the heart muscle from a variety of causes, such as drug or alcohol abuse, infections, or unknown causes. These can increase the risk of heart failure. °· Lung disease. When the lungs do not work properly, the heart must work harder. ° °What increases the risk? °Risk of heart failure increases as a person ages. This condition is also more likely to develop in people who: °· Are overweight. °· Are female. °· Smoke or chew tobacco. °· Abuse alcohol or illegal drugs. °· Have taken medicines that can damage the heart, such as chemotherapy drugs. °· Have diabetes. °? High blood sugar (glucose) is associated with high fat (lipid) levels in the blood. °? Diabetes  can also damage tiny blood vessels that carry nutrients to the heart muscle. °· Have abnormal heart rhythms. °· Have thyroid problems. °· Have low blood counts (anemia). ° °What are the signs or symptoms? °Symptoms of this condition include: °· Shortness of breath with activity, such as when climbing stairs. °· Persistent cough. °· Swelling of the feet, ankles, legs, or abdomen. °· Unexplained weight gain. °· Difficulty breathing when lying flat (orthopnea). °· Waking from sleep because of the need to sit up and get more air. °· Rapid heartbeat. °· Fatigue and loss of energy. °· Feeling light-headed, dizzy, or close to fainting. °· Loss of appetite. °· Nausea. °· Increased urination during the night (nocturia). °· Confusion. ° °How is this diagnosed? °This condition is diagnosed based on: °· Medical history, symptoms, and a physical exam. °· Diagnostic tests, which may include: °? Echocardiogram. °? Electrocardiogram (ECG). °? Chest X-ray. °? Blood tests. °? Exercise stress test. °? Radionuclide scans. °? Cardiac catheterization and angiogram. ° °How is this treated? °Treatment for this condition is aimed at managing the symptoms of heart failure. Medicines, behavioral changes, or other treatments may be necessary to treat heart failure. °Medicines °These may include: °· Angiotensin-converting enzyme (ACE) inhibitors. This type of medicine blocks the effects of a blood protein called angiotensin-converting enzyme. ACE inhibitors relax (dilate) the blood vessels and help to lower blood pressure. °· Angiotensin receptor blockers (ARBs). This type of medicine blocks the actions of a blood protein called angiotensin. ARBs dilate the blood vessels and help to lower blood pressure. °· Water   pills (diuretics). Diuretics cause the kidneys to remove salt and water from the blood. The extra fluid is removed through urination, leaving a lower volume of blood that the heart has to pump. °· Beta blockers. These improve heart  muscle strength and they prevent the heart from beating too quickly. °· Digoxin. This increases the force of the heartbeat. ° °Healthy behavior changes °These may include: °· Reaching and maintaining a healthy weight. °· Stopping smoking or chewing tobacco. °· Eating heart-healthy foods. °· Limiting or avoiding alcohol. °· Stopping use of street drugs (illegal drugs). °· Physical activity. ° °Other treatments °These may include: °· Surgery to open blocked coronary arteries or repair damaged heart valves. °· Placement of a biventricular pacemaker to improve heart muscle function (cardiac resynchronization therapy). This device paces both the right ventricle and left ventricle. °· Placement of a device to treat serious abnormal heart rhythms (implantable cardioverter defibrillator, or ICD). °· Placement of a device to improve the pumping ability of the heart (left ventricular assist device, or LVAD). °· Heart transplant. This can cure heart failure, and it is considered for certain patients who do not improve with other therapies. ° °Follow these instructions at home: °Medicines °· Take over-the-counter and prescription medicines only as told by your health care provider. Medicines are important in reducing the workload of your heart, slowing the progression of heart failure, and improving your symptoms. °? Do not stop taking your medicine unless your health care provider told you to do that. °? Do not skip any dose of medicine. °? Refill your prescriptions before you run out of medicine. You need your medicines every day. °Eating and drinking ° °· Eat heart-healthy foods. Talk with a dietitian to make an eating plan that is right for you. °? Choose foods that contain no trans fat and are low in saturated fat and cholesterol. Healthy choices include fresh or frozen fruits and vegetables, fish, lean meats, legumes, fat-free or low-fat dairy products, and whole-grain or high-fiber foods. °? Limit salt (sodium) if  directed by your health care provider. Sodium restriction may reduce symptoms of heart failure. Ask a dietitian to recommend heart-healthy seasonings. °? Use healthy cooking methods instead of frying. Healthy methods include roasting, grilling, broiling, baking, poaching, steaming, and stir-frying. °· Limit your fluid intake if directed by your health care provider. Fluid restriction may reduce symptoms of heart failure. °Lifestyle °· Stop smoking or using chewing tobacco. Nicotine and tobacco can damage your heart and your blood vessels. Do not use nicotine gum or patches before talking to your health care provider. °· Limit alcohol intake to no more than 1 drink per day for non-pregnant women and 2 drinks per day for men. One drink equals 12 oz of beer, 5 oz of wine, or 1½ oz of hard liquor. °? Drinking more than that is harmful to your heart. Tell your health care provider if you drink alcohol several times a week. °? Talk with your health care provider about whether any level of alcohol use is safe for you. °? If your heart has already been damaged by alcohol or you have severe heart failure, drinking alcohol should be stopped completely. °· Stop use of illegal drugs. °· Lose weight if directed by your health care provider. Weight loss may reduce symptoms of heart failure. °· Do moderate physical activity if directed by your health care provider. People who are elderly and people with severe heart failure should consult with a health care provider for physical activity recommendations. °  Monitor important information °· Weigh yourself every day. Keeping track of your weight daily helps you to notice excess fluid sooner. °? Weigh yourself every morning after you urinate and before you eat breakfast. °? Wear the same amount of clothing each time you weigh yourself. °? Record your daily weight. Provide your health care provider with your weight record. °· Monitor and record your blood pressure as told by your health  care provider. °· Check your pulse as told by your health care provider. °Dealing with extreme temperatures °· If the weather is extremely hot: °? Avoid vigorous physical activity. °? Use air conditioning or fans or seek a cooler location. °? Avoid caffeine and alcohol. °? Wear loose-fitting, lightweight, and light-colored clothing. °· If the weather is extremely cold: °? Avoid vigorous physical activity. °? Layer your clothes. °? Wear mittens or gloves, a hat, and a scarf when you go outside. °? Avoid alcohol. °General instructions °· Manage other health conditions such as hypertension, diabetes, thyroid disease, or abnormal heart rhythms as told by your health care provider. °· Learn to manage stress. If you need help to do this, ask your health care provider. °· Plan rest periods when fatigued. °· Get ongoing education and support as needed. °· Participate in or seek rehabilitation as needed to maintain or improve independence and quality of life. °· Stay up to date with immunizations. Keeping current on pneumococcal and influenza immunizations is especially important to prevent respiratory infections. °· Keep all follow-up visits as told by your health care provider. This is important. °Contact a health care provider if: °· You have a rapid weight gain. °· You have increasing shortness of breath that is unusual for you. °· You are unable to participate in your usual physical activities. °· You tire easily. °· You cough more than normal, especially with physical activity. °· You have any swelling or more swelling in areas such as your hands, feet, ankles, or abdomen. °· You are unable to sleep because it is hard to breathe. °· You feel like your heart is beating quickly (palpitations). °· You become dizzy or light-headed when you stand up. °Get help right away if: °· You have difficulty breathing. °· You notice or your family notices a change in your awareness, such as having trouble staying awake or having  difficulty with concentration. °· You have pain or discomfort in your chest. °· You have an episode of fainting (syncope). °This information is not intended to replace advice given to you by your health care provider. Make sure you discuss any questions you have with your health care provider. °Document Released: 02/24/2005 Document Revised: 10/30/2015 Document Reviewed: 09/19/2015 °Elsevier Interactive Patient Education © 2017 [employer] ° °

## 2016-02-29 NOTE — Progress Notes (Signed)
[facility] that [facility]; [facility] called and updatedAlexis Goodell; B Mailyn Steichen RN,MHA,BSN [phone]

## 2016-04-30 ENCOUNTER — Ambulatory Visit: Payer: Medicare PPO | Admitting: Interventional Cardiology

## 2016-05-02 ENCOUNTER — Encounter: Payer: Self-pay | Admitting: Interventional Cardiology

## 2016-05-10 NOTE — Progress Notes (Signed)
Cardiology Office Note   Date:  05/12/2016   ID:  Bethany Nelson, DOB 03/13/1926, MRN [id number]  PCP:  [name], MD    No chief complaint on file. chronic diastolic heart failure   Wt Readings from Last 3 Encounters:  05/12/16 195 lb (88.5 kg)  02/25/16 184 lb 3.2 oz (83.6 kg)       History of Present Illness: Bethany Nelson is a 83 y.o. female  with medical history significant of DM, HTN. She was admitted for diastolic heart failure in Dec, 2017.  She was diuresed at the time and sent home.  At that time,  She was wheezing and SHOB.  Her daughter took her to the hospital.    - Left ventricle: The cavity size was normal. There was moderate   concentric hypertrophy. Systolic function was normal. Wall motion   was normal; there were no regional wall motion abnormalities.   Doppler parameters are consistent with a reversible restrictive   pattern, indicative of decreased left ventricular diastolic   compliance and/or increased left atrial pressure (grade 3   diastolic dysfunction). Doppler parameters are consistent with   high ventricular filling pressure. - Aortic valve: Transvalvular velocity was within the normal range.   There was no stenosis. Valve area (VTI): 1.56 cm^2. Valve area   (Vmax): 1.53 cm^2. Valve area (Vmean): 1.56 cm^2. - Mitral valve: Transvalvular velocity was within the normal range.   There was no evidence for stenosis. There was mild regurgitation. - Left atrium: The atrium was mildly dilated. - Right ventricle: The cavity size was normal. Wall thickness was   normal. Systolic function was normal. - Tricuspid valve: There was trivial regurgitation. - Pulmonary arteries: Systolic pressure was within the normal   range. PA peak pressure: 34 mm Hg (S).  She was seen by Dr. Eden EmmsNishan and [facility]  THe patient has moved to [place].    Since the last visit, she has had a URI with bad cough.  A1C has decreased to  6.8.  She feels a lot of phlegm. Now using a nebulizer.  THey adjust Lasix dose based on her weight.   She does not like her cough syrup.    Past Medical History:  Diagnosis Date  . Diabetes mellitus without complication (HCC)   . Hypertension     No past surgical history on file.   Current Outpatient Prescriptions  Medication Sig Dispense Refill  . aspirin EC 81 MG tablet Take 81 mg by mouth every other day.    Marland Kitchen. atorvastatin (LIPITOR) 40 MG tablet Take 40 mg by mouth daily.    . cetirizine (ZYRTEC) 10 MG tablet Take 10 mg by mouth daily as needed for allergies.    Marland Kitchen. donepezil (ARICEPT) 5 MG tablet Take 5 mg by mouth at bedtime.    . dorzolamide-timolol (COSOPT) 22.3-6.8 MG/ML ophthalmic solution Place 1 drop into both eyes 2 (two) times daily.    . fluticasone (FLONASE) 50 MCG/ACT nasal spray Place 1 spray into both nostrils daily.    . furosemide (LASIX) 20 MG tablet Take 1 tablet (20 mg total) by mouth daily. 30 tablet 0  . LANTUS SOLOSTAR 100 UNIT/ML Solostar Pen Inject 22 Units into the skin every morning.    . latanoprost (XALATAN) 0.005 % ophthalmic solution Place 1 drop into both eyes at bedtime.    Marland Kitchen. levothyroxine (SYNTHROID, LEVOTHROID) 75 MCG tablet Take 75 mcg by mouth daily before  breakfast.    . metoprolol tartrate (LOPRESSOR) 25 MG tablet Take 25 mg by mouth 2 (two) times daily.    . valsartan (DIOVAN) 320 MG tablet Take 320 mg by mouth daily.     No current facility-administered medications for this visit.     Allergies:   Patient has no known allergies.    Social History:  The patient  reports that she has never smoked. She has never used smokeless tobacco. She reports that she does not drink alcohol or use drugs.   Family History:  The patient's family history includes Esophageal cancer in her mother.    ROS:  Please see the history of present illness.   Otherwise, review of systems are positive for cough.   All other systems are reviewed and negative.     PHYSICAL EXAM: VS:  BP (!) 134/98 (BP Location: Right Arm, Patient Position: Sitting, Cuff Size: Large)   Pulse 76   Ht 5\' 5"  (1.651 m)   Wt 195 lb (88.5 kg)   SpO2 95%   BMI 32.45 kg/m  , BMI Body mass index is 32.45 kg/m. GEN: Well nourished, well developed, in no acute distress  HEENT: normal  Neck: no JVD, carotid bruits, or masses Cardiac: RRR; no murmurs, rubs, or gallops; mild bilateral lower extremity edema  Respiratory:  clear to auscultation bilaterally, normal work of breathing GI: soft, nontender, nondistended, + BS MS: no deformity or atrophy  Skin: warm and dry, no rash Neuro:  Strength and sensation are intact; speaks slowly Psych: euthymic mood, flat affect   EKG:   The ekg ordered in 12/17 demonstrates NSR, no ST segment changes   Recent Labs: 02/21/2016: ALT 19; B Natriuretic Peptide 749.8 02/25/2016: BUN 34; Creatinine, Ser 1.58; Hemoglobin 10.5; Platelets 155; Potassium 4.1; Sodium 142   Lipid Panel No results found for: CHOL, TRIG, HDL, CHOLHDL, VLDL, LDLCALC, LDLDIRECT   Other studies Reviewed: Additional studies/ records that were reviewed today with results demonstrating: Echocardiogram results as noted above..   ASSESSMENT AND PLAN:  1. Chronic diastolic heart failure: She appears euvolemic at this time. Continue current dose of furosemide. She can increase to 40 mg daily if her weight increases or she has more swelling. She should watch for symptoms of orthopnea. Continue low-sodium diet.  Of late, she has only required 20 mg of Lasix daily. 2. Hypertensive heart disease: Higher in the office today.  Check at home or in the drugstore. I suspect he'll be lower outside of the doctor's office. Would like to see readings below 130/80 given her diabetes.  COntinue Diovan.  ARB a good choice given her diabetes. 3. DM: Managed by PMD.  CKD related to this. 4. Hyperlipidemia: Followed by primary care doctor. LDL target less than 100.  COntinue  atorvastatin.  5. Of note, she now lives in [place]. She prefers to follow-up in the [place] office. Since I know her son-in-law, Armond Hang, she prefers to follow-up with me.   Current medicines are reviewed at length with the patient today.  The patient concerns regarding her medicines were addressed.  The following changes have been made:  No change  Labs/ tests ordered today include:  No orders of the defined types were placed in this encounter.   Recommend 150 minutes/week of aerobic exercise that she can do safely, avoid falls Low fat, low carb, high fiber diet recommended  Disposition:   FU in 6 months   Signed, Lance Muss, MD  05/12/2016 9:40 AM  [facility] [address], [place], [place]  [address] Phone: [phone]; Fax: [phone]

## 2016-05-12 ENCOUNTER — Encounter (INDEPENDENT_AMBULATORY_CARE_PROVIDER_SITE_OTHER): Payer: Self-pay

## 2016-05-12 ENCOUNTER — Encounter: Payer: Self-pay | Admitting: Interventional Cardiology

## 2016-05-12 ENCOUNTER — Ambulatory Visit: Payer: Medicare PPO | Admitting: Interventional Cardiology

## 2016-05-12 ENCOUNTER — Ambulatory Visit (INDEPENDENT_AMBULATORY_CARE_PROVIDER_SITE_OTHER): Payer: Medicare PPO | Admitting: Interventional Cardiology

## 2016-05-12 VITALS — BP 134/98 | HR 76 | Ht 65.0 in | Wt 195.0 lb

## 2016-05-12 DIAGNOSIS — E785 Hyperlipidemia, unspecified: Secondary | ICD-10-CM | POA: Insufficient documentation

## 2016-05-12 DIAGNOSIS — I5032 Chronic diastolic (congestive) heart failure: Secondary | ICD-10-CM | POA: Diagnosis not present

## 2016-05-12 DIAGNOSIS — E782 Mixed hyperlipidemia: Secondary | ICD-10-CM

## 2016-05-12 DIAGNOSIS — I5033 Acute on chronic diastolic (congestive) heart failure: Secondary | ICD-10-CM | POA: Insufficient documentation

## 2016-05-12 DIAGNOSIS — N183 Chronic kidney disease, stage 3 unspecified: Secondary | ICD-10-CM

## 2016-05-12 DIAGNOSIS — E1122 Type 2 diabetes mellitus with diabetic chronic kidney disease: Secondary | ICD-10-CM | POA: Diagnosis not present

## 2016-05-12 DIAGNOSIS — I130 Hypertensive heart and chronic kidney disease with heart failure and stage 1 through stage 4 chronic kidney disease, or unspecified chronic kidney disease: Secondary | ICD-10-CM | POA: Diagnosis not present

## 2016-05-12 NOTE — Patient Instructions (Addendum)
Medication Instructions:  Same-no changes  Labwork: None  Testing/Procedures: None  Follow-Up: Your physician wants you to follow-up in: 6 months. You will receive a reminder letter in the mail two months in advance. If you don't receive a letter, please call our office to schedule the follow-up appointment.      If you need a refill on your cardiac medications before your next appointment, please call your pharmacy.   

## 2016-06-22 ENCOUNTER — Encounter (HOSPITAL_COMMUNITY): Payer: Self-pay | Admitting: Emergency Medicine

## 2016-06-22 ENCOUNTER — Emergency Department (HOSPITAL_COMMUNITY): Payer: Medicare PPO

## 2016-06-22 ENCOUNTER — Inpatient Hospital Stay (HOSPITAL_COMMUNITY)
Admission: EM | Admit: 2016-06-22 | Discharge: 2016-06-26 | DRG: 291 | Disposition: A | Discharge status: Home Health | Payer: Medicare PPO | Attending: Internal Medicine | Admitting: Internal Medicine

## 2016-06-22 DIAGNOSIS — I440 Atrioventricular block, first degree: Secondary | ICD-10-CM | POA: Diagnosis present

## 2016-06-22 DIAGNOSIS — I447 Left bundle-branch block, unspecified: Secondary | ICD-10-CM | POA: Diagnosis present

## 2016-06-22 DIAGNOSIS — N183 Chronic kidney disease, stage 3 unspecified: Secondary | ICD-10-CM

## 2016-06-22 DIAGNOSIS — K5900 Constipation, unspecified: Secondary | ICD-10-CM | POA: Diagnosis not present

## 2016-06-22 DIAGNOSIS — N184 Chronic kidney disease, stage 4 (severe): Secondary | ICD-10-CM | POA: Diagnosis present

## 2016-06-22 DIAGNOSIS — Z800 Family history of malignant neoplasm of digestive organs: Secondary | ICD-10-CM | POA: Diagnosis not present

## 2016-06-22 DIAGNOSIS — E1122 Type 2 diabetes mellitus with diabetic chronic kidney disease: Secondary | ICD-10-CM | POA: Diagnosis present

## 2016-06-22 DIAGNOSIS — Z6833 Body mass index (BMI) 33.0-33.9, adult: Secondary | ICD-10-CM | POA: Diagnosis not present

## 2016-06-22 DIAGNOSIS — I2510 Atherosclerotic heart disease of native coronary artery without angina pectoris: Secondary | ICD-10-CM | POA: Diagnosis present

## 2016-06-22 DIAGNOSIS — D649 Anemia, unspecified: Secondary | ICD-10-CM | POA: Diagnosis present

## 2016-06-22 DIAGNOSIS — I509 Heart failure, unspecified: Secondary | ICD-10-CM | POA: Diagnosis not present

## 2016-06-22 DIAGNOSIS — I5033 Acute on chronic diastolic (congestive) heart failure: Secondary | ICD-10-CM | POA: Diagnosis present

## 2016-06-22 DIAGNOSIS — I5020 Unspecified systolic (congestive) heart failure: Secondary | ICD-10-CM | POA: Diagnosis present

## 2016-06-22 DIAGNOSIS — Z794 Long term (current) use of insulin: Secondary | ICD-10-CM | POA: Diagnosis not present

## 2016-06-22 DIAGNOSIS — E669 Obesity, unspecified: Secondary | ICD-10-CM | POA: Diagnosis present

## 2016-06-22 DIAGNOSIS — R7989 Other specified abnormal findings of blood chemistry: Secondary | ICD-10-CM

## 2016-06-22 DIAGNOSIS — N179 Acute kidney failure, unspecified: Secondary | ICD-10-CM | POA: Diagnosis present

## 2016-06-22 DIAGNOSIS — J9601 Acute respiratory failure with hypoxia: Secondary | ICD-10-CM | POA: Diagnosis present

## 2016-06-22 DIAGNOSIS — F0390 Unspecified dementia without behavioral disturbance: Secondary | ICD-10-CM | POA: Diagnosis present

## 2016-06-22 DIAGNOSIS — E785 Hyperlipidemia, unspecified: Secondary | ICD-10-CM | POA: Diagnosis present

## 2016-06-22 DIAGNOSIS — I130 Hypertensive heart and chronic kidney disease with heart failure and stage 1 through stage 4 chronic kidney disease, or unspecified chronic kidney disease: Principal | ICD-10-CM | POA: Diagnosis present

## 2016-06-22 DIAGNOSIS — Z7982 Long term (current) use of aspirin: Secondary | ICD-10-CM | POA: Diagnosis not present

## 2016-06-22 DIAGNOSIS — Z79899 Other long term (current) drug therapy: Secondary | ICD-10-CM

## 2016-06-22 DIAGNOSIS — M7989 Other specified soft tissue disorders: Secondary | ICD-10-CM | POA: Diagnosis not present

## 2016-06-22 DIAGNOSIS — I10 Essential (primary) hypertension: Secondary | ICD-10-CM | POA: Diagnosis present

## 2016-06-22 DIAGNOSIS — R778 Other specified abnormalities of plasma proteins: Secondary | ICD-10-CM | POA: Diagnosis present

## 2016-06-22 DIAGNOSIS — I248 Other forms of acute ischemic heart disease: Secondary | ICD-10-CM | POA: Diagnosis present

## 2016-06-22 DIAGNOSIS — R748 Abnormal levels of other serum enzymes: Secondary | ICD-10-CM | POA: Diagnosis not present

## 2016-06-22 DIAGNOSIS — R001 Bradycardia, unspecified: Secondary | ICD-10-CM | POA: Diagnosis not present

## 2016-06-22 HISTORY — DX: Chronic diastolic (congestive) heart failure: I50.32

## 2016-06-22 LAB — COMPREHENSIVE METABOLIC PANEL
ALBUMIN: 2.8 g/dL — AB (ref 3.5–5.0)
ALT: 16 U/L (ref 14–54)
ANION GAP: 9 (ref 5–15)
AST: 29 U/L (ref 15–41)
Alkaline Phosphatase: 59 U/L (ref 38–126)
BUN: 47 mg/dL — AB (ref 6–20)
CO2: 29 mmol/L (ref 22–32)
Calcium: 8.5 mg/dL — ABNORMAL LOW (ref 8.9–10.3)
Chloride: 102 mmol/L (ref 101–111)
Creatinine, Ser: 1.85 mg/dL — ABNORMAL HIGH (ref 0.44–1.00)
GFR calc Af Amer: 27 mL/min — ABNORMAL LOW (ref >60)
GFR calc non Af Amer: 23 mL/min — ABNORMAL LOW (ref >60)
GLUCOSE: 383 mg/dL — AB (ref 65–99)
POTASSIUM: 4 mmol/L (ref 3.5–5.1)
SODIUM: 140 mmol/L (ref 135–145)
Total Bilirubin: 0.5 mg/dL (ref 0.3–1.2)
Total Protein: 5.9 g/dL — ABNORMAL LOW (ref 6.5–8.1)

## 2016-06-22 LAB — TROPONIN I
Troponin I: 0.83 ng/mL (ref <0.03)
Troponin I: 1.69 ng/mL (ref <0.03)
Troponin I: 2.29 ng/mL (ref <0.03)

## 2016-06-22 LAB — CBC WITH DIFFERENTIAL/PLATELET
Basophils Absolute: 0 10*3/uL (ref 0.0–0.1)
Basophils Relative: 0 %
EOS PCT: 3 %
Eosinophils Absolute: 0.2 10*3/uL (ref 0.0–0.7)
HEMATOCRIT: 34.8 % — AB (ref 36.0–46.0)
Hemoglobin: 10.8 g/dL — ABNORMAL LOW (ref 12.0–15.0)
LYMPHS ABS: 0.8 10*3/uL (ref 0.7–4.0)
LYMPHS PCT: 12 %
MCH: 30.6 pg (ref 26.0–34.0)
MCHC: 31 g/dL (ref 30.0–36.0)
MCV: 98.6 fL (ref 78.0–100.0)
MONO ABS: 0.3 10*3/uL (ref 0.1–1.0)
MONOS PCT: 4 %
NEUTROS ABS: 5.8 10*3/uL (ref 1.7–7.7)
Neutrophils Relative %: 81 %
Platelets: 171 10*3/uL (ref 150–400)
RBC: 3.53 MIL/uL — ABNORMAL LOW (ref 3.87–5.11)
RDW: 15.3 % (ref 11.5–15.5)
WBC: 7.2 10*3/uL (ref 4.0–10.5)

## 2016-06-22 LAB — GLUCOSE, CAPILLARY
GLUCOSE-CAPILLARY: 372 mg/dL — AB (ref 65–99)
GLUCOSE-CAPILLARY: 287 mg/dL — AB (ref 65–99)
GLUCOSE-CAPILLARY: 138 mg/dL — AB (ref 65–99)
Glucose-Capillary: 67 mg/dL (ref 65–99)
Glucose-Capillary: 100 mg/dL — ABNORMAL HIGH (ref 65–99)
Glucose-Capillary: 117 mg/dL — ABNORMAL HIGH (ref 65–99)

## 2016-06-22 LAB — I-STAT TROPONIN, ED: Troponin i, poc: 0.28 ng/mL (ref 0.00–0.08)

## 2016-06-22 LAB — BRAIN NATRIURETIC PEPTIDE: B Natriuretic Peptide: 694.6 pg/mL — ABNORMAL HIGH (ref 0.0–100.0)

## 2016-06-22 MED ORDER — FUROSEMIDE 10 MG/ML IJ SOLN: 40.0000 mg | INTRAMUSCULAR | Status: DC

## 2016-06-22 MED ORDER — SODIUM CHLORIDE 0.9% FLUSH: 3.0000 mL | INTRAVENOUS | Status: DC | PRN

## 2016-06-22 MED ORDER — ASPIRIN EC 81 MG PO TBEC: 81.0000 mg | DELAYED_RELEASE_TABLET | ORAL | Status: DC

## 2016-06-22 MED ORDER — HEPARIN SODIUM (PORCINE) 5000 UNIT/ML IJ SOLN
5000.0000 [IU] | Freq: Three times a day (TID) | INTRAMUSCULAR | Status: DC
  Administered 2016-06-22 – 2016-06-26 (×12): 5000 [IU] via SUBCUTANEOUS
  Filled 2016-06-22 (×12): qty 1

## 2016-06-22 MED ORDER — INSULIN ASPART 100 UNIT/ML ~~LOC~~ SOLN
7.0000 [IU] | Freq: Once | SUBCUTANEOUS | Status: AC
  Administered 2016-06-22: 7 [IU] via SUBCUTANEOUS

## 2016-06-22 MED ORDER — METOPROLOL TARTRATE 25 MG PO TABS
25.0000 mg | ORAL_TABLET | Freq: Two times a day (BID) | ORAL | Status: DC
  Administered 2016-06-22: 25 mg via ORAL
  Filled 2016-06-22 (×5): qty 1

## 2016-06-22 MED ORDER — ASPIRIN EC 81 MG PO TBEC
81.0000 mg | DELAYED_RELEASE_TABLET | ORAL | Status: DC
  Administered 2016-06-23 – 2016-06-25 (×2): 81 mg via ORAL
  Filled 2016-06-22 (×4): qty 1

## 2016-06-22 MED ORDER — SODIUM CHLORIDE 0.9 % IV SOLN: 250.0000 mL | INTRAVENOUS | Status: DC | PRN

## 2016-06-22 MED ORDER — IPRATROPIUM-ALBUTEROL 0.5-2.5 (3) MG/3ML IN SOLN
RESPIRATORY_TRACT | Status: AC
  Administered 2016-06-22
  Filled 2016-06-22: qty 3

## 2016-06-22 MED ORDER — DONEPEZIL HCL 5 MG PO TABS
5.0000 mg | ORAL_TABLET | Freq: Every day | ORAL | Status: DC
  Administered 2016-06-22 – 2016-06-25 (×4): 5 mg via ORAL
  Filled 2016-06-22 (×4): qty 1

## 2016-06-22 MED ORDER — ACETAMINOPHEN 325 MG PO TABS: 650.0000 mg | ORAL_TABLET | ORAL | Status: DC | PRN

## 2016-06-22 MED ORDER — FLUTICASONE PROPIONATE 50 MCG/ACT NA SUSP: 1.0000 | Freq: Every day | NASAL | Status: DC | PRN

## 2016-06-22 MED ORDER — ATORVASTATIN CALCIUM 40 MG PO TABS
40.0000 mg | ORAL_TABLET | Freq: Every day | ORAL | Status: DC
  Administered 2016-06-22 – 2016-06-26 (×5): 40 mg via ORAL
  Filled 2016-06-22 (×5): qty 1

## 2016-06-22 MED ORDER — LATANOPROST 0.005 % OP SOLN
1.0000 [drp] | Freq: Every day | OPHTHALMIC | Status: DC
  Administered 2016-06-22 – 2016-06-25 (×4): 1 [drp] via OPHTHALMIC
  Filled 2016-06-22: qty 2.5

## 2016-06-22 MED ORDER — SODIUM CHLORIDE 0.9% FLUSH
3.0000 mL | Freq: Two times a day (BID) | INTRAVENOUS | Status: DC
  Administered 2016-06-22 – 2016-06-26 (×9): 3 mL via INTRAVENOUS

## 2016-06-22 MED ORDER — INSULIN ASPART 100 UNIT/ML ~~LOC~~ SOLN
0.0000 [IU] | Freq: Three times a day (TID) | SUBCUTANEOUS | Status: DC
  Administered 2016-06-22: 2 [IU] via SUBCUTANEOUS
  Administered 2016-06-22: 8 [IU] via SUBCUTANEOUS

## 2016-06-22 MED ORDER — FUROSEMIDE 10 MG/ML IJ SOLN
40.0000 mg | Freq: Two times a day (BID) | INTRAMUSCULAR | Status: DC
  Administered 2016-06-22 – 2016-06-25 (×6): 40 mg via INTRAVENOUS
  Filled 2016-06-22 (×6): qty 4

## 2016-06-22 MED ORDER — FUROSEMIDE 10 MG/ML IJ SOLN: 20.0000 mg | Freq: Once | INTRAMUSCULAR | Status: DC

## 2016-06-22 MED ORDER — INSULIN GLARGINE 100 UNIT/ML ~~LOC~~ SOLN
20.0000 [IU] | Freq: Every day | SUBCUTANEOUS | Status: DC
  Administered 2016-06-22 – 2016-06-25 (×4): 20 [IU] via SUBCUTANEOUS
  Filled 2016-06-22 (×4): qty 0.2

## 2016-06-22 MED ORDER — ASPIRIN 81 MG PO CHEW
324.0000 mg | CHEWABLE_TABLET | Freq: Once | ORAL | Status: AC
  Administered 2016-06-22: 324 mg via ORAL
  Filled 2016-06-22: qty 4

## 2016-06-22 MED ORDER — INSULIN ASPART 100 UNIT/ML ~~LOC~~ SOLN
0.0000 [IU] | Freq: Every day | SUBCUTANEOUS | Status: DC
  Administered 2016-06-25: 3 [IU] via SUBCUTANEOUS

## 2016-06-22 MED ORDER — DORZOLAMIDE HCL-TIMOLOL MAL 2-0.5 % OP SOLN
1.0000 [drp] | Freq: Two times a day (BID) | OPHTHALMIC | Status: DC
  Administered 2016-06-22 – 2016-06-26 (×9): 1 [drp] via OPHTHALMIC
  Filled 2016-06-22: qty 10

## 2016-06-22 MED ORDER — INSULIN ASPART 100 UNIT/ML ~~LOC~~ SOLN
3.0000 [IU] | Freq: Three times a day (TID) | SUBCUTANEOUS | Status: DC
  Administered 2016-06-22 – 2016-06-26 (×12): 3 [IU] via SUBCUTANEOUS

## 2016-06-22 MED ORDER — INSULIN ASPART 100 UNIT/ML ~~LOC~~ SOLN
0.0000 [IU] | Freq: Three times a day (TID) | SUBCUTANEOUS | Status: DC
  Administered 2016-06-23 – 2016-06-24 (×4): 2 [IU] via SUBCUTANEOUS
  Administered 2016-06-25: 5 [IU] via SUBCUTANEOUS
  Administered 2016-06-25 – 2016-06-26 (×2): 2 [IU] via SUBCUTANEOUS

## 2016-06-22 MED ORDER — FUROSEMIDE 10 MG/ML IJ SOLN
40.0000 mg | Freq: Once | INTRAMUSCULAR | Status: AC
  Administered 2016-06-22: 40 mg via INTRAVENOUS
  Filled 2016-06-22: qty 4

## 2016-06-22 MED ORDER — ONDANSETRON HCL 4 MG/2ML IJ SOLN: 4.0000 mg | Freq: Four times a day (QID) | INTRAMUSCULAR | Status: DC | PRN

## 2016-06-22 NOTE — Progress Notes (Signed)
NURSING PROGRESS NOTE  Bethany Nelson [id number] Admission Data: 06/22/2016 7:40 AM Attending Provider: Edsel Petrin, DO WUJ:WJXBJ Burnett Sheng, MD Code Status: Full  Bethany Nelson is a 83 y.o. female patient admitted from ED:  -No acute distress noted.  -No complaints of shortness of breath.  -No complaints of chest pain.   Cardiac Monitoring: Box # 25 in place. Cardiac monitor yields:AVB  Blood pressure (!) 154/72, pulse 66, temperature 98 F (36.7 C), temperature source Oral, resp. rate 18, weight 87 kg (191 lb 11.2 oz), SpO2 97 %.   IV Fluids:  IV in place, occlusive dsg intact without redness, IV cath forearm right, condition patent and no redness none.   Allergies:  Patient has no known allergies.  Past Medical History:   has a past medical history of Diabetes mellitus without complication (HCC) and Hypertension.  Past Surgical History:   has no past surgical history on file.  Social History:   reports that she has never smoked. She has never used smokeless tobacco. She reports that she does not drink alcohol or use drugs.  Skin: intact with redden sacral. Barrier cream applied the the area.  Patient arrived to the room but appeared to incontinent of stool and urine. Discarded patient's clothing with daughter's persmission. Immediately cleaned the patient. Patient/Family orientated to room. Information packet given to patient/family. Admission inpatient armband information verified with patient/family to include name and date of birth and placed on patient arm. Side rails up x 2, fall assessment and education completed with patient/family. Patient/family able to verbalize understanding of risk associated with falls and verbalized understanding to call for assistance before getting out of bed. Call light within reach. Patient/family able to voice and demonstrate understanding of unit orientation instructions.    Will continue to evaluate and treat per MD orders.

## 2016-06-22 NOTE — ED Triage Notes (Signed)
Pt presents from home with [facility] for sudden onset SOB that began at 10pm with rales and ex wheezes; fire on scene got intital O2 sat of 80% and was put on supplemental O2 and brought up to 95%; EMS placed pt on CPAP and brought sats up to 99-100%; EMS also gave 2 SL NTG and  Albuterol nebulized en route; pt denies fever, denies CP

## 2016-06-22 NOTE — Progress Notes (Addendum)
Order received. Trend troponin's now and every 6 hours. No other orders at this time per NP, K Schorr.  Patient denies any chest pain at this moment. Resting comfortably.  Will continue to monitor.

## 2016-06-22 NOTE — ED Provider Notes (Signed)
[facility]-EMERGENCY DEPT Provider Note   CSN: [id number] Arrival date & time: 06/22/16  0018  By signing my name below, I, Bethany Nelson, attest that this documentation has been prepared under the direction and in the presence of physician practitioner, Benjiman Core, MD. Electronically Signed: Linna Nelson, Scribe. 06/22/2016. 12:28 AM.  History   Chief Complaint Chief Complaint  Patient presents with  . Shortness of Breath    The history is provided by the patient. No language interpreter was used.     HPI Comments: LEVEL 5 CAVEAT FOR ALTERED MENTAL STATUS Bethany Nelson is a 83 y.o. female with PMHx including DM2, HTN, CKD, and CHF who presents to the Emergency Department via EMS complaining of sudden onset, constant SOB and wheezing for a few days, worse since 10 PM last night. She reports an associated productive cough with clear sputum. Per EMS, pt's oxygen saturation was 80% upon arrival and improved to 95% with nasal cannula oxygen. Patient was subsequently placed on CPAP machine and received nitroglycerin  and albuterol  en route. Pt denies chest pain or any other associated symptoms.  Past Medical History:  Diagnosis Date  . Diabetes mellitus without complication (HCC)   . Hypertension     Patient Active Problem List   Diagnosis Date Noted  . Elevated troponin 06/22/2016  . Acute respiratory failure with hypoxia (HCC) 06/22/2016  . 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 Chronic diastolic heart failure (HCC) 05/12/2016  . Hyperlipidemia 05/12/2016  . Acute on chronic diastolic CHF (congestive heart failure) (HCC) 02/21/2016  . HTN (hypertension) 02/21/2016  . DM2 (diabetes mellitus, type 2) (HCC) 02/21/2016  . CKD (chronic kidney disease), stage IV (HCC) 02/21/2016    History reviewed. No pertinent surgical history.  OB History    No  data available       Home Medications    Prior to Admission medications   Medication Sig Start Date End Date Taking? Authorizing Provider  aspirin EC 81 MG tablet Take 81 mg by mouth every other day.   Yes Historical Provider, MD  atorvastatin (LIPITOR) 40 MG tablet Take 40 mg by mouth daily. 01/08/16  Yes Historical Provider, MD  cetirizine (ZYRTEC) 10 MG tablet Take 10 mg by mouth daily as needed for allergies.   Yes Historical Provider, MD  donepezil (ARICEPT) 5 MG tablet Take 5 mg by mouth at bedtime. 01/08/16  Yes Historical Provider, MD  dorzolamide-timolol (COSOPT) 22.3-6.8 MG/ML ophthalmic solution Place 1 drop into both eyes 2 (two) times daily. 12/16/15  Yes Historical Provider, MD  fluticasone (FLONASE) 50 MCG/ACT nasal spray Place 1 spray into both nostrils daily as needed for allergies.  02/17/16  Yes Historical Provider, MD  furosemide (LASIX) 20 MG tablet Take 1 tablet (20 mg total) by mouth daily. 02/26/16  Yes Elease Etienne, MD  LANTUS SOLOSTAR 100 UNIT/ML Solostar Pen Inject 20 Units into the skin every morning.  01/09/16  Yes Historical Provider, MD  latanoprost (XALATAN) 0.005 % ophthalmic solution Place 1 drop into both eyes at bedtime. 02/15/16  Yes Historical Provider, MD  metoprolol tartrate (LOPRESSOR) 25 MG tablet Take 25 mg by mouth 2 (two) times daily. 01/09/16  Yes Historical Provider, MD  pioglitazone (ACTOS) 15 MG tablet Take 15 mg by mouth daily.   Yes Historical Provider, MD  valsartan (DIOVAN) 320 MG tablet Take 320 mg by mouth daily. 01/08/16  Yes Historical Provider, MD  levothyroxine (SYNTHROID, LEVOTHROID) 75 MCG tablet Take 75 mcg by mouth daily before breakfast. 01/08/16   Historical Provider, MD    Family History Family History  Problem Relation Age of Onset  . Esophageal cancer Mother   . Heart disease Neg Hx     Social History Social History  Substance Use Topics  . Smoking status: Never Smoker  . Smokeless tobacco: Never Used  . Alcohol  use No     Allergies   Patient has no known allergies.   Review of Systems Review of Systems  Unable to perform ROS: Mental status change   Physical Exam Updated Vital Signs BP (!) 154/72 (BP Location: Left Arm)   Pulse 66   Temp 98 F (36.7 C) (Oral)   Resp 18   Wt 191 lb 11.2 oz (87 kg)   SpO2 97%   BMI 31.90 kg/m   Physical Exam  Constitutional: She is oriented to person, place, and time. She appears well-developed and well-nourished. No distress.  HENT:  Head: Normocephalic and atraumatic.  Eyes: Conjunctivae and EOM are normal.  Neck: Neck supple. No JVD present. No tracheal deviation present.  Cardiovascular: Normal rate.   Pulmonary/Chest: She has wheezes.  Diffuse wheezes. Prolonged expirations worse on right side. Nasal cannula oxygen is on.  Musculoskeletal: Normal range of motion. She exhibits edema.  Trace peripheral edema.  Neurological: She is alert and oriented to person, place, and time.  Skin: Skin is warm and dry.  Psychiatric: She has a normal mood and affect. Her behavior is normal.  Nursing note and vitals reviewed.  ED Treatments / Results  Labs (all labs ordered are listed, but only abnormal results are displayed) Labs Reviewed  COMPREHENSIVE METABOLIC PANEL - Abnormal; Notable for the following:       Result Value   Glucose, Bld 383 (*)    BUN 47 (*)    Creatinine, Ser 1.85 (*)    Calcium 8.5 (*)    Total Protein 5.9 (*)    Albumin 2.8 (*)    GFR calc non Af Amer 23 (*)    GFR calc Af Amer 27 (*)    All other components within normal limits  CBC WITH DIFFERENTIAL/PLATELET - Abnormal; Notable for the following:    RBC 3.53 (*)    Hemoglobin 10.8 (*)    HCT 34.8 (*)    All other components within normal limits  BRAIN NATRIURETIC PEPTIDE - Abnormal; Notable for the following:    B Natriuretic Peptide 694.6 (*)    All other components within normal limits  TROPONIN I - Abnormal; Notable for the following:    Troponin I 0.83 (*)     All other components within normal limits  GLUCOSE, CAPILLARY - Abnormal; Notable for the following:    Glucose-Capillary 372 (*)    All other components within normal limits  I-STAT TROPOININ, ED - Abnormal; Notable for the following:    Troponin i, poc 0.28 (*)    All other components within normal limits  TROPONIN I  TROPONIN I    EKG  EKG Interpretation  Date/Time:  Sunday June 22 2016 00:35:41 EDT Ventricular Rate:  71 PR Interval:    QRS Duration: 129 QT Interval:  451 QTC Calculation: 491 R Axis:   -22 Text Interpretation:  Sinus rhythm Prolonged PR interval Left bundle branch block No significant change since last tracing Confirmed by Rubin Payor  MD, Rich Paprocki [phone]) on 06/22/2016 1:10:33 AM  Radiology Dg Chest Portable 1 View  Result Date: 06/22/2016 CLINICAL DATA:  Dyspnea and wheeze EXAM: PORTABLE CHEST 1 VIEW COMPARISON:  None. FINDINGS: Cardiomegaly with pulmonary vascular redistribution and small bilateral pleural effusions left greater than right are consistent with CHF. Aortic atherosclerosis without aneurysm. No acute osseous abnormality. Osteoarthritis with spurring about the left glenohumeral joint. IMPRESSION: Cardiomegaly with CHF and bilateral small pleural effusions. Aortic atherosclerosis. Electronically Signed   By: Tollie Eth M.D.   On: 06/22/2016 01:26    Procedures Procedures (including critical care time)  DIAGNOSTIC STUDIES: Oxygen Saturation is 98% on [place]father, normal by my interpretation.    Medications Ordered in ED Medications  atorvastatin (LIPITOR) tablet 40 mg (not administered)  donepezil (ARICEPT) tablet 5 mg (not administered)  dorzolamide-timolol (COSOPT) 22.3-6.8 MG/ML ophthalmic solution 1 drop (not administered)  fluticasone (FLONASE) 50 MCG/ACT nasal spray 1 spray (not administered)  latanoprost (XALATAN) 0.005 % ophthalmic solution 1 drop (not administered)  metoprolol tartrate (LOPRESSOR) tablet 25 mg (not administered)  sodium  chloride flush (NS) 0.9 % injection 3 mL (not administered)  sodium chloride flush (NS) 0.9 % injection 3 mL (not administered)  0.9 %  sodium chloride infusion (not administered)  acetaminophen (TYLENOL) tablet 650 mg (not administered)  ondansetron (ZOFRAN) injection 4 mg (not administered)  heparin injection 5,000 Units (not administered)  furosemide (LASIX) injection 40 mg (not administered)  insulin glargine (LANTUS) injection 20 Units (not administered)  insulin aspart (novoLOG) injection 0-15 Units (not administered)  insulin aspart (novoLOG) injection 0-5 Units (not administered)  insulin aspart (novoLOG) injection 3 Units (not administered)  aspirin EC tablet 81 mg (not administered)  ipratropium-albuterol (DUONEB) 0.5-2.5 (3) MG/3ML nebulizer solution (  Given 06/22/16 0027)  furosemide (LASIX) injection 40 mg (40 mg Intravenous Given 06/22/16 0504)  insulin aspart (novoLOG) injection 7 Units (7 Units Subcutaneous Given 06/22/16 0505)  aspirin chewable tablet 324 mg (324 mg Oral Given 06/22/16 0610)     Initial Impression / Assessment and Plan / ED Course  I have reviewed the triage vital signs and the nursing notes.  Pertinent labs & imaging results that were available during my care of the patient were reviewed by me and considered in my medical decision making (see chart for details).     Patient with shortness of breath. Wheezes and some rales. Initial sats of 80%. Troponin mildly elevated. EKG reassuring. History of CHF. BNP is elevated. Will admit to internal medicine. I believe the troponin elevation is likely demand ischemia.  Final Clinical Impressions(s) / ED Diagnoses   Final diagnoses:  Acute on chronic congestive heart failure, unspecified heart failure type [facility])    New Prescriptions Current Discharge Medication List     I personally performed the services described in this documentation, which was scribed in my presence. The recorded information has been  reviewed and is accurate.      Benjiman Core, MD 06/22/16 [id number]

## 2016-06-22 NOTE — H&P (Signed)
History and Physical    Bethany Nelson:[id number] DOB: 07-02-1926 DOA: 06/22/2016  PCP: Jerl Mina, MD   Patient coming from: Home  Chief Complaint: SOB, swelling, wt gain  HPI: Bethany Nelson is a 83 y.o. female with medical history significant for hypertension, insulin-dependent diabetes mellitus, chronic kidney disease stage III-IV, chronic diastolic CHF, and dementia who presents to the emergency department for evaluation of shortness of breath and swelling. Patient is accompanied by her daughter who assists with the history. The patient reportedly lives at home with children and had been gaining weight and becoming short of breath over the past week to 10 days, and suspecting CHF, her outpatient provider doubled her Lasix and added 5 days and metolazone which she completed on 06/19/2016. She seemed to make some improvement with this change in her therapy before developing respiratory distress overnight. Patient is not able to contribute significantly to the history given her dementia, but she denies any chest pain or palpitations and denies nausea. Her daughter has been with her and has not noted any diaphoresis and the patient has not appeared to be in any pain. Family has noted increased lower extremity swelling which improved some with her increased diuretics recently before re-worsening. EMS found her to be saturating 80% on room air and she was transported with CPAP and albuterol neb treatment.  ED Course: Upon arrival to the ED, patient is found to be afebrile, saturating 80% on room air initially, and with vitals otherwise stable. EKG features a sinus rhythm with left bundle branch block, QRS slightly wider than priors. Chest x-ray features cardiomegaly with CHF and small bilateral pleural effusions. Chemistry panels notable for a BUN of 47 and creatinine 1.85, up from 1.58 in December 2017. Serum glucose is elevated to 383. CBC is notable for a stable normocytic anemia with  hemoglobin of 10.8. Troponin is elevated to a value of 0.28 and BNP is elevated to 695. Patient was given a dose of IV Lasix in the emergency department. She is oxygenating and mentating well on 4 L/m via nasal cannulae and remains hemodynamically stable without any anginal complaints. She will be admitted to the telemetry unit for ongoing evaluation and management of acute hypoxic respiratory failure with increased swelling and weight gain suspected secondary to acute on chronic diastolic CHF.  Review of Systems:  All other systems reviewed and apart from HPI, are negative.  Past Medical History:  Diagnosis Date  . Diabetes mellitus without complication (HCC)   . Hypertension     History reviewed. No pertinent surgical history.   reports that she has never smoked. She has never used smokeless tobacco. She reports that she does not drink alcohol or use drugs.  No Known Allergies  Family History  Problem Relation Age of Onset  . Esophageal cancer Mother   . Heart disease Neg Hx      Prior to Admission medications   Medication Sig Start Date End Date Taking? Authorizing Provider  aspirin EC 81 MG tablet Take 81 mg by mouth every other day.   Yes Historical Provider, MD  atorvastatin (LIPITOR) 40 MG tablet Take 40 mg by mouth daily. 01/08/16  Yes Historical Provider, MD  cetirizine (ZYRTEC) 10 MG tablet Take 10 mg by mouth daily as needed for allergies.   Yes Historical Provider, MD  donepezil (ARICEPT) 5 MG tablet Take 5 mg by mouth at bedtime. 01/08/16  Yes Historical Provider, MD  dorzolamide-timolol (COSOPT) 22.3-6.8 MG/ML ophthalmic solution Place 1 drop into  both eyes 2 (two) times daily. 12/16/15  Yes Historical Provider, MD  fluticasone (FLONASE) 50 MCG/ACT nasal spray Place 1 spray into both nostrils daily as needed for allergies.  02/17/16  Yes Historical Provider, MD  furosemide (LASIX) 20 MG tablet Take 1 tablet (20 mg total) by mouth daily. 02/26/16  Yes Elease Etienne, MD    LANTUS SOLOSTAR 100 UNIT/ML Solostar Pen Inject 20 Units into the skin every morning.  01/09/16  Yes Historical Provider, MD  latanoprost (XALATAN) 0.005 % ophthalmic solution Place 1 drop into both eyes at bedtime. 02/15/16  Yes Historical Provider, MD  metoprolol tartrate (LOPRESSOR) 25 MG tablet Take 25 mg by mouth 2 (two) times daily. 01/09/16  Yes Historical Provider, MD  pioglitazone (ACTOS) 15 MG tablet Take 15 mg by mouth daily.   Yes Historical Provider, MD  valsartan (DIOVAN) 320 MG tablet Take 320 mg by mouth daily. 01/08/16  Yes Historical Provider, MD  levothyroxine (SYNTHROID, LEVOTHROID) 75 MCG tablet Take 75 mcg by mouth daily before breakfast. 01/08/16   Historical Provider, MD    Physical Exam: Vitals:   06/22/16 0036 06/22/16 0045 06/22/16 0100 06/22/16 0130  BP: (!) 164/59 (!) 144/59 (!) 140/56 (!) 135/54  Pulse: 70 65 66 66  Resp: 20 (!) 21 (!) 25 (!) 21  Temp: 97.5 F (36.4 C)     TempSrc: Oral     SpO2: 98% 99% 98% 97%      Constitutional: No acute respiratory distress, calm Eyes: PERTLA, lids and conjunctivae normal ENMT: Mucous membranes are moist. Posterior pharynx clear of any exudate or lesions.   Neck: normal, supple, no masses, no thyromegaly Respiratory: Diminished in bases, crackles bilaterally. Mild increase in WOB. No pallor or cyanosis.  Cardiovascular: S1 & S2 heard, regular rate and rhythm. Bilateral leg edema to thighs. Marked neck vein distension. Abdomen: No distension, no tenderness, no masses palpated. Bowel sounds normal.  Musculoskeletal: no clubbing / cyanosis. No joint deformity upper and lower extremities. Normal muscle tone.  Skin: no significant rashes, lesions, ulcers. Warm, dry, well-perfused. Neurologic: CN 2-12 grossly intact. Sensation intact, DTR normal. Strength 5/5 in all 4 limbs.  Psychiatric: Alert and oriented to person and place only. Pleasant and cooperative.     Labs on Admission: I have personally reviewed following  labs and imaging studies  CBC:  Recent Labs Lab 06/22/16 0039  WBC 7.2  NEUTROABS 5.8  HGB 10.8*  HCT 34.8*  MCV 98.6  PLT 171   Basic Metabolic Panel:  Recent Labs Lab 06/22/16 0039  NA 140  K 4.0  CL 102  CO2 29  GLUCOSE 383*  BUN 47*  CREATININE 1.85*  CALCIUM 8.5*   GFR: CrCl cannot be calculated (Unknown ideal weight.). Liver Function Tests:  Recent Labs Lab 06/22/16 0039  AST 29  ALT 16  ALKPHOS 59  BILITOT 0.5  PROT 5.9*  ALBUMIN 2.8*   No results for input(s): LIPASE, AMYLASE in the last 168 hours. No results for input(s): AMMONIA in the last 168 hours. Coagulation Profile: No results for input(s): INR, PROTIME in the last 168 hours. Cardiac Enzymes: No results for input(s): CKTOTAL, CKMB, CKMBINDEX, TROPONINI in the last 168 hours. BNP (last 3 results) No results for input(s): PROBNP in the last 8760 hours. HbA1C: No results for input(s): HGBA1C in the last 72 hours. CBG: No results for input(s): GLUCAP in the last 168 hours. Lipid Profile: No results for input(s): CHOL, HDL, LDLCALC, TRIG, CHOLHDL, LDLDIRECT in the last 72  hours. Thyroid Function Tests: No results for input(s): TSH, T4TOTAL, FREET4, T3FREE, THYROIDAB in the last 72 hours. Anemia Panel: No results for input(s): VITAMINB12, FOLATE, FERRITIN, TIBC, IRON, RETICCTPCT in the last 72 hours. Urine analysis:    Component Value Date/Time   COLORURINE Yellow 05/21/2013 2205   APPEARANCEUR Cloudy 05/21/2013 2205   LABSPEC 1.012 05/21/2013 2205   PHURINE 5.0 05/21/2013 2205   GLUCOSEU 150 mg/dL 16/12/9602 5409   HGBUR 1+ 05/21/2013 2205   BILIRUBINUR Negative 05/21/2013 2205   KETONESUR Trace 05/21/2013 2205   PROTEINUR 100 mg/dL 81/19/1478 2956   NITRITE Negative 05/21/2013 2205   LEUKOCYTESUR Negative 05/21/2013 2205   Sepsis Labs: (procalcitonin:4,lacticidven:4) )No results found for this or any previous visit (from the past 240 hour(s)).   Radiological Exams on  Admission: Dg Chest Portable 1 View  Result Date: 06/22/2016 CLINICAL DATA:  Dyspnea and wheeze EXAM: PORTABLE CHEST 1 VIEW COMPARISON:  None. FINDINGS: Cardiomegaly with pulmonary vascular redistribution and small bilateral pleural effusions left greater than right are consistent with CHF. Aortic atherosclerosis without aneurysm. No acute osseous abnormality. Osteoarthritis with spurring about the left glenohumeral joint. IMPRESSION: Cardiomegaly with CHF and bilateral small pleural effusions. Aortic atherosclerosis. Electronically Signed   By: Tollie Eth M.D.   On: 06/22/2016 01:26    EKG: Independently reviewed. Sinus rhythm, LBBB, QRS slightly wider when compared to priors.  Assessment/Plan  1. Acute on chronic diastolic CHF  - Presents with acute respiratory distress, saturating 80% on rm air initially - Noted to have peripheral edema, marked JVD, crackles, and wt gain  - CXR supports acute on chronic CHF  - TTE (02/22/16) with normal EF, grade 3 diastolic dysfunction, moderate concentric hypertrophy, mild MR, and mild LAE - Managed at home with Lasix 20 mg qD; was increased to 40 mg qD and 5-day course of metolazone completed 06/19/16  - Given Lasix 40 mg IV in ED; plan to continue diuresis with Lasix 40 mg IV q12h, with changes as needed based on response  - SLIV, fluid-restrict diet, and follow daily wts and strict I/O's  - Update echo, monitor on telemetry, follow qAM chem panels    2. Acute hypoxic respiratory failure  - Saturating 80% initially, now mentating and oxygenating well with 4 Lpm  - Likely secondary to acute CHF, treating as above  3. Elevated troponin - Troponin elevated to 0.28 without chest pain  - Second troponin elevated to 0.83; pt continues to deny anginal complaints, appears comfortable  - Treat with ASA 324 mg, continue Lopressor and statin  - Monitor on telemetry for ischemic changes, trend troponin, follow-up echocardiogram   4. CKD stage III-IV  - SCr  is 1.85 on admission; was 1.58 in December 2017  - Likely from acute CHF, diuresing as above and following qAM chem panel    5. Insulin-dependent DM  - A1c was 8.5% in 2013  - Managed at home with pioglitazone and Lantus 20 units qAM  - Check CBG with meals and qHS  - Continue Lantus 20 units qAM with Novolog 3 units with meals and moderate-intensity sliding-scale correctional    6. Hypertension  - BP mildly elevated on presentation in setting of acute CHF  - Anticipate improvement with diuresis  - Continue Lopressor; hold valsartan in light of increased creatinine   7. Dementia  - Appears to be stable  - Continue Aricept    DVT prophylaxis: sq heparin  Code Status: Full  Family Communication: Daughter updated at bedside Disposition Plan:  Admit to telemetry Consults called: None Admission status: Inpatient    Briscoe Deutscher, MD [facility] Pager [phone]  If 7PM-7AM, please contact night-coverage [email or web address] Password [email or web address]  06/22/2016, 2:50 AM

## 2016-06-22 NOTE — Progress Notes (Signed)
Patient had hypoglycemic event of 67. Pt asymptomatic. Orange juice and dinner given. CBG recheck was 100. MD made aware.

## 2016-06-22 NOTE — Progress Notes (Addendum)
Earlier today by Dr. Marcial Pacas Opyd. See H&P for full details.  83 year old female with history of hypertension, diabetes, kidney disease, diastolic heart failure and dementia presented with increased shortness of breath and swelling. Patient recently had her Lasix dose doubled as well as metolazone as an outpatient. She has been followed by home health. Patient overnight became more short of breath was brought to the emergency department with oxygen saturations in 80%.  Patient admitted for acute on chronic diastolic heart failure, hypoxic respiratory failure, elevated troponin. Pending echocardiogram. Continue to cycle troponins. Continue Lasix 40 mg IV twice a day and monitor intake and output.  Of note, patient is on pioglitazone for Diabetes. Given CHF and history of, this should be discontinued at discharge as Actos does have a black box warning.   Time spent: 20 minutes  Ianna Salmela D.O. [facility] Pager [phone]  If 7PM-7AM, please contact night-coverage [email or web address] Password [facility] 06/22/2016, 12:07 PM

## 2016-06-22 NOTE — Progress Notes (Signed)
Patient arrived with EMS on CPAP. Patient taken off CPAP and given breathing treatment on arrival to room. Auscultated crackles and wheezes on the right side. Patient placed on 3L nasal cannula following breathing treatment. Patient is tolerating well at this time.

## 2016-06-22 NOTE — Progress Notes (Signed)
CRITICAL VALUE STICKER  CRITICAL VALUE:Trop 0.83  RECEIVER (on-site recipient of call): Orvan Seen RN  DATE & TIME NOTIFIED: 4/15 5:20am  MESSENGER (representative from lab):  MD NOTIFIED: Md Opyd paged awaiting response  TIME OF NOTIFICATION: 5:43  RESPONSE:

## 2016-06-23 ENCOUNTER — Other Ambulatory Visit: Payer: Self-pay

## 2016-06-23 ENCOUNTER — Inpatient Hospital Stay (HOSPITAL_COMMUNITY): Payer: Medicare PPO

## 2016-06-23 ENCOUNTER — Encounter (HOSPITAL_COMMUNITY): Payer: Self-pay | Admitting: Student

## 2016-06-23 DIAGNOSIS — I5033 Acute on chronic diastolic (congestive) heart failure: Secondary | ICD-10-CM

## 2016-06-23 DIAGNOSIS — I509 Heart failure, unspecified: Secondary | ICD-10-CM

## 2016-06-23 DIAGNOSIS — I10 Essential (primary) hypertension: Secondary | ICD-10-CM

## 2016-06-23 DIAGNOSIS — R748 Abnormal levels of other serum enzymes: Secondary | ICD-10-CM

## 2016-06-23 DIAGNOSIS — N183 Chronic kidney disease, stage 3 (moderate): Secondary | ICD-10-CM

## 2016-06-23 LAB — BASIC METABOLIC PANEL
ANION GAP: 5 (ref 5–15)
BUN: 43 mg/dL — ABNORMAL HIGH (ref 6–20)
CO2: 31 mmol/L (ref 22–32)
Calcium: 8.4 mg/dL — ABNORMAL LOW (ref 8.9–10.3)
Chloride: 104 mmol/L (ref 101–111)
Creatinine, Ser: 1.68 mg/dL — ABNORMAL HIGH (ref 0.44–1.00)
GFR calc non Af Amer: 26 mL/min — ABNORMAL LOW (ref >60)
GFR, EST AFRICAN AMERICAN: 30 mL/min — AB (ref >60)
GLUCOSE: 94 mg/dL (ref 65–99)
POTASSIUM: 3.8 mmol/L (ref 3.5–5.1)
Sodium: 140 mmol/L (ref 135–145)

## 2016-06-23 LAB — GLUCOSE, CAPILLARY
GLUCOSE-CAPILLARY: 101 mg/dL — AB (ref 65–99)
Glucose-Capillary: 160 mg/dL — ABNORMAL HIGH (ref 65–99)
Glucose-Capillary: 166 mg/dL — ABNORMAL HIGH (ref 65–99)
Glucose-Capillary: 157 mg/dL — ABNORMAL HIGH (ref 65–99)
Glucose-Capillary: 111 mg/dL — ABNORMAL HIGH (ref 65–99)

## 2016-06-23 LAB — ECHOCARDIOGRAM COMPLETE
Height: 62 in
WEIGHTICAEL: 2731.2 [oz_av]

## 2016-06-23 LAB — TROPONIN I
Troponin I: 1.15 ng/mL (ref <0.03)
Troponin I: 1.71 ng/mL (ref <0.03)
Troponin I: 2.01 ng/mL (ref <0.03)

## 2016-06-23 NOTE — Progress Notes (Signed)
Follow-up:  Notified by RN regarding elevated troponin of 2.29 (at 1821) from 1.69 (at 1053) today.  It is unclear from record whether attending MD from today was notified of these values. Pt has not had c/o CP. She is currently on full strength ASA QD, BB and statin. Repeat EKG reveals SR w/ 1st degree AVB w/ rate of 60 and st depressions in leads V4,5 & 6 not noted on previous EKG. Discussed pt w/ Dr Mayford Knife w/ cardiology service who also reviewed EKG and pt record. No further recommendations at this time. Recommends re-consult to cardiology in am as indicated if pending echo abnormal or pt's clinical picture changes. Currently pt resting in NAD. VSS. Will continue to monitor closely on telemetry.   Leanne Chang, NP-C [facility] Pager [phone]

## 2016-06-23 NOTE — Progress Notes (Signed)
Troponin level decreased from 2.29 to 2.01. NP on call  K. Schorr made aware of results.  No new orders. Pt asleep. Will continue to monitor.  Yanis Larin, RN

## 2016-06-23 NOTE — Consult Note (Signed)
Cardiology Consult    Patient ID: VERTA Nelson MRN: [id number], DOB/AGE: December 22, 1926   Admit date: 06/22/2016 Date of Consult: 06/23/2016  Primary Physician: Jerl Mina, MD Reason for Consult: CHF, Elevated Troponin Primary Cardiologist: Dr. Eldridge Dace Requesting Provider: Dr. Allena Katz  History of Present Illness    Bethany Nelson is a 83 y.o. female with past medical history of chronic diastolic CHF (normal EF with Grade 3 DD by echo in 02/2016), IDDM, Stage 3 CKD, dementia, HLD and HTN who is being seen today in consultation at the request Dr. Allena Katz for CHF and elevated troponin values.   Was recently seen in the office by Dr. Eldridge Dace in 05/2016 and reported doing well from a cardiac perspective at that time. Was recovering from a URI at that time but denied any recent chest pain, palpitations, or dyspnea on exertion. She was continued on PO Lasix  daily with instructions to take an additional tablet as needed for edema or weight gain.   She presented to [facility] ED on 06/22/2016 for acute worsening of her respiratory status. Initial O2 saturations were in the 80's upon EMS arrival but improved to mid-90% with nasal cannula.   Weights had initially started increasing two weeks ago (190 lbs --> 195 lbs) and Home Health was made aware and recommended increasing Lasix from  daily to  daily with the addition of Metolazone. She was on this regimen for 5 days then resumed Lasix  daily. Weights have since been stable between 188 - 191 lbs on her home scales.   She denies any recent chest pain or dyspnea on exertion. She is pleasantly demented and looks to her daughter for answers to the questions. Her daughter reports she has not reported any recent symptoms. She currently resides with her daughter and son-in-law, using a walker for ambulation.   Initial labs show WBC of 7.2, Hgb 10.8, and platelets 171. Na+ 140, K+ 4.0, creatinine 1.85 (baseline ~ 1.5). BNP 694. Initial  troponin 0.28 with repeat values at 0.83, 1.69, 2.29, 2.01, and 1.71. CXR showing cardiomegaly with CHF and bilateral pleural effusions. EKG from 4/15 shows NSR, HR 60, with 1st degree AV block, and ST depression along the anterior leads. Repeat EKG today shows sinus bradycardia, HR 57,  with 1st degree AV Block and ST abnormalities along the anterior leads appearing resolved when compared to prior tracings.   She has been started on IV Lasix  BID with only a net output of -0.6L thus far (additional 600 cc in Foley bag). Weight was at 195 lbs in 05/2016, reported as 191 lbs on admission and 170 (?) lbs today, doubt accuracy of this, most likely 190 lbs.    Past Medical History   Past Medical History:  Diagnosis Date  . Chronic diastolic congestive heart failure (HCC)    a. 02/2016: echo showing normal EF, Grade 3 DD, mild MR, trivial TR  . Diabetes mellitus without complication (HCC)   . Hypertension     History reviewed. No pertinent surgical history.   Allergies  No Known Allergies  Inpatient Medications    . aspirin EC  81 mg Oral QODAY  . atorvastatin  40 mg Oral Daily  . donepezil  5 mg Oral QHS  . dorzolamide-timolol  1 drop Both Eyes BID  . furosemide  40 mg Intravenous BID  . heparin  5,000 Units Subcutaneous Q8H  . insulin aspart  0-5 Units Subcutaneous QHS  . insulin aspart  0-9 Units Subcutaneous  TID WC  . insulin aspart  3 Units Subcutaneous TID WC  . insulin glargine  20 Units Subcutaneous QHS  . latanoprost  1 drop Both Eyes QHS  . metoprolol tartrate  25 mg Oral BID  . sodium chloride flush  3 mL Intravenous Q12H    Family History    Family History  Problem Relation Age of Onset  . Esophageal cancer Mother   . Heart disease Neg Hx     Social History    Social History   Social History  . Marital status: Widowed    Spouse name: N/A  . Number of children: N/A  . Years of education: N/A   Occupational History  . Not on file.   Social History  Main Topics  . Smoking status: Never Smoker  . Smokeless tobacco: Never Used  . Alcohol use No  . Drug use: No  . Sexual activity: Not on file   Other Topics Concern  . Not on file   Social History Narrative  . No narrative on file     Review of Systems    General:  No chills, fever, night sweats or weight changes.  Cardiovascular:  No chest pain, edema, orthopnea, palpitations, paroxysmal nocturnal dyspnea. Positive for dyspnea on exertion.  Dermatological: No rash, lesions/masses Respiratory: No cough, Positive for dyspnea Urologic: No hematuria, dysuria Abdominal:   No nausea, vomiting, diarrhea, bright red blood per rectum, melena, or hematemesis Neurologic:  No visual changes, wkns, changes in mental status. All other systems reviewed and are otherwise negative except as noted above.  Physical Exam    Blood pressure (!) 121/49, pulse (!) 57, temperature 99 F (37.2 C), temperature source Oral, resp. rate 18, height  (1.575 m), weight 170 lb 11.2 oz (77.4 kg), SpO2 98 %.  General: Pleasant, elderly Caucasian female appearing in NAD Psych: Normal affect. Neuro: Alert and oriented X 2 (person, place). Moves all extremities spontaneously. HEENT: Normal  Neck: Supple without bruits or JVD. Lungs:  Resp regular and unlabored, mild rales at bases bilaterally Heart: RRR no s3, s4, or murmurs. Abdomen: Soft, non-tender, non-distended, BS + x 4.  Extremities: No clubbing or cyanosis. Trace ankle edema. DP/PT/Radials 2+ and equal bilaterally.  Labs    Troponin [facility] Test)  Recent Labs  06/22/16 0040  TROPIPOC 0.28*    Recent Labs  06/22/16 1053 06/22/16 1821 06/23/16 0001 06/23/16 0556  TROPONINI 1.69* 2.29* 2.01* 1.71*   Lab Results  Component Value Date   WBC 7.2 06/22/2016   HGB 10.8 (L) 06/22/2016   HCT 34.8 (L) 06/22/2016   MCV 98.6 06/22/2016   PLT 171 06/22/2016     Recent Labs Lab 06/22/16 0039 06/23/16 0556  NA 140 140  K 4.0 3.8   CL 102 104  CO2 29 31  BUN 47* 43*  CREATININE 1.85* 1.68*  CALCIUM 8.5* 8.4*  PROT 5.9*  --   BILITOT 0.5  --   ALKPHOS 59  --   ALT 16  --   AST 29  --   GLUCOSE 383* 94   No results found for: CHOL, HDL, LDLCALC, TRIG No results found for: [facility]   Radiology Studies    Dg Chest Portable 1 View  Result Date: 06/22/2016 CLINICAL DATA:  Dyspnea and wheeze EXAM: PORTABLE CHEST 1 VIEW COMPARISON:  None. FINDINGS: Cardiomegaly with pulmonary vascular redistribution and small bilateral pleural effusions left greater than right are consistent with CHF. Aortic atherosclerosis without aneurysm. No acute  osseous abnormality. Osteoarthritis with spurring about the left glenohumeral joint. IMPRESSION: Cardiomegaly with CHF and bilateral small pleural effusions. Aortic atherosclerosis. Electronically Signed   By: Tollie Eth M.D.   On: 06/22/2016 01:26    EKG & Cardiac Imaging    EKG:  4/15: NSR, HR 60, with 1st degree AV block, and ST depression along the anterior leads.  4/16: Sinus bradycardia, HR 57,  with 1st degree AV Block and ST abnormalities along the anterior leads appearing resolved when compared to prior tracings. - Personally Reviewed  Echocardiogram: 02/2016 Study Conclusions  - Left ventricle: The cavity size was normal. There was moderate   concentric hypertrophy. Systolic function was normal. Wall motion   was normal; there were no regional wall motion abnormalities.   Doppler parameters are consistent with a reversible restrictive   pattern, indicative of decreased left ventricular diastolic   compliance and/or increased left atrial pressure (grade 3   diastolic dysfunction). Doppler parameters are consistent with   high ventricular filling pressure. - Aortic valve: Transvalvular velocity was within the normal range.   There was no stenosis. Valve area (VTI): 1.56 cm^2. Valve area   (Vmax): 1.53 cm^2. Valve area (Vmean): 1.56 cm^2. - Mitral valve: Transvalvular  velocity was within the normal range.   There was no evidence for stenosis. There was mild regurgitation. - Left atrium: The atrium was mildly dilated. - Right ventricle: The cavity size was normal. Wall thickness was   normal. Systolic function was normal. - Tricuspid valve: There was trivial regurgitation. - Pulmonary arteries: Systolic pressure was within the normal   range. PA peak pressure: 34 mm Hg (S).  Assessment & Plan   1. Acute on Chronic Diastolic CHF - echo in 02/2016 showed a normal EF with Grade 3 DD. Weights have been fluctuating at home, peaking at 195 lbs two weeks ago with her requiring increased Lasix dosing along the addition of Metolazone.  - presented with acute worsening dyspnea. BNP 694. CXR showing cardiomegaly with CHF and bilateral pleural effusions. - started on IV Lasix  BID with only a net output of -1.2L thus far. Weight was at 195 lbs in 05/2016, reported as 191 lbs on admission and 170 (?) lbs today, doubt accuracy of this, most likely 190 lbs.  Would continue with IV Lasix  BID. Will need to establish a new dry weight. Continue with IV Lasix. Repeat BMET in AM. Was on PO Lasix  daily PTA (will likely need to increase to  daily at time of discharge).    2. Elevated Troponin - initial troponin 0.28 with repeat values at 0.83, 1.69, 2.29, 2.01, and 1.71.  - ROS limited by dementia but she denies any current symptoms and patient's family note that she has not reported any recent chest pain or dyspnea on exertion. - initial EKG showed ST depression along the anterior leads but has since resolved.  - repeat echo is pending to assess LV function and wall motion. Would favor a conservative approach in the setting of her Stage 3 CKD and dementia.  - continue treatment for presumed CAD with ASA, statin, and BB.   3. Stage 3 CKD - baseline ~ 1.5. Creatinine at 1.85 on admission, improved to 1.71 this AM.   4. HTN - BP at 121/42 - 152/50 in the past  24 hours.  - continue Lopressor  BID.   5. HLD - followed by PCP. Remains on Lipitor  daily.   6. Dementia - is A&Ox2 but unable  to answer questions about recent history.  - continue Aricept.     Signed, Bethany Lennox, PA-C 06/23/2016, 11:33 AM Pager: [phone]  I have seen and examined the patient along with Bethany Lennox, PA-C.  I have reviewed the chart, notes and new data.  I agree with PA's note.  Key new complaints: no angina, denies dyspnea, but appears tachypneic at rest. Key examination changes: JVP 10-12 cm, S3 present, no edema, diminished breath sounds at bases, but no rales Key new findings / data: creat 1.7. Trop peaked at 2.0. Transient ST depression anterolaterally on background of minor IVCD and nonspecific repol changes  PLAN: While it is very likely that she has underlying CAD, I would avoid workup for ischemia based on her advanced age, poor functional status, moderate to severe CKD, absence of angina and (to date) preserved LV systolic function.  Will review echo - if there is a clear cut new and major wall motion abnormality, might reconsider our approach.  We probably  need to reset her estimated dry weight lower (at hospital DC last December was 183-184 lb, suspect she may have lost additional real weight and we should shoot for 180 or so).  Thurmon Fair, MD, [facility] [phone] 06/23/2016, 12:04 PM

## 2016-06-23 NOTE — Progress Notes (Signed)
[facility] Progress Note  Patient: Bethany Nelson FAO:[id number]   PCP: Jerl Mina, MD DOB: February 03, 1927   DOA: 06/22/2016   DOS: 06/23/2016   Date of Service: the patient was seen and examined on 06/23/2016  Subjective: Patient denies having any complaints of chest pain, still having shortness of breath. Occasional cough.  Brief hospital course: Pt. with PMH of CHF, HTN, DM,CKD ; admitted on 06/22/2016, with complaint of shortness of breath, was found to have acute on chronic diastolic CHF as well as elevated troponin. Currently further plan is continue diuresis when optimizing medications.  Assessment and Plan: 1. Acute on chronic diastolic CHF (congestive heart failure) (HCC) Elevated troponin. Acute hypoxic respiratory failure  Started on IV Lasix currently on 40 mg twice a day. Troponin elevated, cardiology consulted. Echo currently pending. Continue aspirin, statin, beta blocker. Monitor on telemetry. Most likely conservative management.  2. Stage III 4 chronic kidney disease. Renal function getting better, we will need to closely monitor while the patient is receiving IV diuresis.  3. Insulin-dependent DM  - A1c was 8.5% in 2013  - Managed at home with pioglitazone and Lantus 20 units qAM  - Check CBG with meals and qHS  - Continue Lantus 20 units qAM with Novolog 3 units with meals and moderate-intensity sliding-scale correctional    4. Hypertension  - BP mildly elevated on presentation in setting of acute CHF  - Anticipate improvement with diuresis  - Continue Lopressor; hold valsartan in light of increased creatinine   5. Dementia  - Appears to be stable  - Continue Aricept   Bowel regimen: last BM 06/22/2016 Diet: Cardiac and carb modified diet DVT Prophylaxis: subcutaneous Heparin  Advance goals of care discussion: Full code  Family Communication: family was present at bedside, at the time of interview. The pt provided permission to discuss medical  plan with the family. Opportunity was given to ask question and all questions were answered satisfactorily.   Disposition:  Discharge to home. Expected discharge date: 06/25/2016, improvement in respiratory status  Consultants: Cardiology Procedures: Echocardiogram  Antibiotics: Anti-infectives    None       Objective: Physical Exam: Vitals:   06/23/16 0540 06/23/16 0853 06/23/16 1000 06/23/16 1100  BP: (!) 150/50 (!) 121/49  (!) 146/51  Pulse: (!) 54 (!) 57  62  Resp:  18  18  Temp: 99 F (37.2 C)   98.4 F (36.9 C)  TempSrc: Oral   Oral  SpO2: 95% 100% 98% 95%  Weight: 77.4 kg (170 lb 11.2 oz)     Height:        Intake/Output Summary (Last 24 hours) at 06/23/16 1527 Last data filed at 06/23/16 0900  Gross per 24 hour  Intake              480 ml  Output             1200 ml  Net             -720 ml   Filed Weights   06/22/16 0417 06/23/16 0540  Weight: 87 kg (191 lb 11.2 oz) 77.4 kg (170 lb 11.2 oz)   General: Alert, Awake and Oriented to Time, Place and Person. Appear in mild distress, affect appropriate Eyes: PERRL, Conjunctiva normal ENT: Oral Mucosa clear moist. Neck: difficult to assess JVD, no Abnormal Mass Or lumps Cardiovascular: S1 and S2 Present, aortic systolic Murmur, Respiratory: Bilateral Air entry equal and Decreased, no use of accessory muscle, faint basal Crackles,  no wheezes Abdomen: Bowel Sound present, Soft and no tenderness Skin: no redness, no Rash, no induration Extremities: trace Pedal edema, no calf tenderness Neurologic: Grossly no focal neuro deficit. Bilaterally Equal motor strength  Data Reviewed: CBC:  Recent Labs Lab 06/22/16 0039  WBC 7.2  NEUTROABS 5.8  HGB 10.8*  HCT 34.8*  MCV 98.6  PLT 171   Basic Metabolic Panel:  Recent Labs Lab 06/22/16 0039 06/23/16 0556  NA 140 140  K 4.0 3.8  CL 102 104  CO2 29 31  GLUCOSE 383* 94  BUN 47* 43*  CREATININE 1.85* 1.68*  CALCIUM 8.5* 8.4*    Liver Function  Tests:  Recent Labs Lab 06/22/16 0039  AST 29  ALT 16  ALKPHOS 59  BILITOT 0.5  PROT 5.9*  ALBUMIN 2.8*   No results for input(s): LIPASE, AMYLASE in the last 168 hours. No results for input(s): AMMONIA in the last 168 hours. Coagulation Profile: No results for input(s): INR, PROTIME in the last 168 hours. Cardiac Enzymes:  Recent Labs Lab 06/22/16 1053 06/22/16 1821 06/23/16 0001 06/23/16 0556 06/23/16 1329  TROPONINI 1.69* 2.29* 2.01* 1.71* 1.15*   BNP (last 3 results) No results for input(s): PROBNP in the last 8760 hours. CBG:  Recent Labs Lab 06/22/16 1654 06/22/16 2213 06/23/16 0732 06/23/16 1125 06/23/16 1139  GLUCAP 100* 117* 101* 160* 166*   Studies: No results found.  Scheduled Meds: . aspirin EC  81 mg Oral QODAY  . atorvastatin  40 mg Oral Daily  . donepezil  5 mg Oral QHS  . dorzolamide-timolol  1 drop Both Eyes BID  . furosemide  40 mg Intravenous BID  . heparin  5,000 Units Subcutaneous Q8H  . insulin aspart  0-5 Units Subcutaneous QHS  . insulin aspart  0-9 Units Subcutaneous TID WC  . insulin aspart  3 Units Subcutaneous TID WC  . insulin glargine  20 Units Subcutaneous QHS  . latanoprost  1 drop Both Eyes QHS  . metoprolol tartrate  25 mg Oral BID  . sodium chloride flush  3 mL Intravenous Q12H   Continuous Infusions: PRN Meds: sodium chloride, acetaminophen, fluticasone, ondansetron (ZOFRAN) IV, sodium chloride flush  Time spent: 35 minutes  Author: Lynden Oxford, MD [facility] Pager: [phone] 06/23/2016 3:27 PM  If 7PM-7AM, please contact night-coverage at [email or web address], password [facility]

## 2016-06-23 NOTE — Progress Notes (Signed)
  Echocardiogram 2D Echocardiogram has been performed.  Bethany Nelson 06/23/2016, 10:53 AM

## 2016-06-23 NOTE — Progress Notes (Signed)
Wean off O2 - 97% on room air

## 2016-06-24 ENCOUNTER — Inpatient Hospital Stay (HOSPITAL_COMMUNITY): Payer: Medicare PPO

## 2016-06-24 DIAGNOSIS — M7989 Other specified soft tissue disorders: Secondary | ICD-10-CM

## 2016-06-24 LAB — CBC
HEMATOCRIT: 30.9 % — AB (ref 36.0–46.0)
HEMOGLOBIN: 9.9 g/dL — AB (ref 12.0–15.0)
MCH: 31.2 pg (ref 26.0–34.0)
MCHC: 32 g/dL (ref 30.0–36.0)
MCV: 97.5 fL (ref 78.0–100.0)
Platelets: 142 10*3/uL — ABNORMAL LOW (ref 150–400)
RBC: 3.17 MIL/uL — ABNORMAL LOW (ref 3.87–5.11)
RDW: 15 % (ref 11.5–15.5)
WBC: 6.1 10*3/uL (ref 4.0–10.5)

## 2016-06-24 LAB — GLUCOSE, CAPILLARY
GLUCOSE-CAPILLARY: 86 mg/dL (ref 65–99)
GLUCOSE-CAPILLARY: 187 mg/dL — AB (ref 65–99)
GLUCOSE-CAPILLARY: 194 mg/dL — AB (ref 65–99)
Glucose-Capillary: 165 mg/dL — ABNORMAL HIGH (ref 65–99)

## 2016-06-24 LAB — BASIC METABOLIC PANEL
ANION GAP: 12 (ref 5–15)
BUN: 43 mg/dL — ABNORMAL HIGH (ref 6–20)
CO2: 32 mmol/L (ref 22–32)
Calcium: 8.4 mg/dL — ABNORMAL LOW (ref 8.9–10.3)
Chloride: 98 mmol/L — ABNORMAL LOW (ref 101–111)
Creatinine, Ser: 1.75 mg/dL — ABNORMAL HIGH (ref 0.44–1.00)
GFR calc Af Amer: 29 mL/min — ABNORMAL LOW (ref >60)
GFR, EST NON AFRICAN AMERICAN: 25 mL/min — AB (ref >60)
Glucose, Bld: 91 mg/dL (ref 65–99)
POTASSIUM: 3.7 mmol/L (ref 3.5–5.1)
SODIUM: 142 mmol/L (ref 135–145)

## 2016-06-24 LAB — MAGNESIUM: Magnesium: 2.1 mg/dL (ref 1.7–2.4)

## 2016-06-24 MED ORDER — POLYETHYLENE GLYCOL 3350 17 G PO PACK
17.0000 g | PACK | Freq: Every day | ORAL | Status: DC
  Administered 2016-06-24 – 2016-06-26 (×3): 17 g via ORAL
  Filled 2016-06-24 (×3): qty 1

## 2016-06-24 MED ORDER — METOPROLOL TARTRATE 25 MG PO TABS
25.0000 mg | ORAL_TABLET | Freq: Two times a day (BID) | ORAL | Status: DC
  Administered 2016-06-24 – 2016-06-26 (×4): 25 mg via ORAL
  Filled 2016-06-24 (×4): qty 1

## 2016-06-24 NOTE — Progress Notes (Signed)
Patient with no complaints or concerns during 7pm - 7am shift. No complaints of pain.  Slept during the night.   Gerrick Ray, RN

## 2016-06-24 NOTE — Progress Notes (Signed)
Educated the patient on pressure ulcer diet and management with daughter in the room. Discussed the need for frequent ambulation to reduce the chances of pressure ulcer.

## 2016-06-24 NOTE — Progress Notes (Signed)
Patient Name: Bethany Nelson Date of Encounter: 06/24/2016  Primary Cardiologist: Dr. Alanda Slim Problem List     Principal Problem:   Acute on chronic diastolic CHF (congestive heart failure) (HCC) Active Problems:   HTN (hypertension)   DM2 (diabetes mellitus, type 2) (HCC)   CKD (chronic kidney disease), stage IV (HCC)   Elevated troponin   Acute respiratory failure with hypoxia (HCC)     Subjective   Feeling a little better. Was able to rest last night on a small incline. No PND. No chest pain. Per daughter, she got up to walk with RN and could only make it to door given profound weakness   Inpatient Medications    Scheduled Meds: . aspirin EC  81 mg Oral QODAY  . atorvastatin  40 mg Oral Daily  . donepezil  5 mg Oral QHS  . dorzolamide-timolol  1 drop Both Eyes BID  . furosemide  40 mg Intravenous BID  . heparin  5,000 Units Subcutaneous Q8H  . insulin aspart  0-5 Units Subcutaneous QHS  . insulin aspart  0-9 Units Subcutaneous TID WC  . insulin aspart  3 Units Subcutaneous TID WC  . insulin glargine  20 Units Subcutaneous QHS  . latanoprost  1 drop Both Eyes QHS  . metoprolol tartrate  25 mg Oral BID  . sodium chloride flush  3 mL Intravenous Q12H   Continuous Infusions: . sodium chloride     PRN Meds: sodium chloride, acetaminophen, fluticasone, ondansetron (ZOFRAN) IV, sodium chloride flush   Vital Signs    Vitals:   06/23/16 2150 06/24/16 0622 06/24/16 0808 06/24/16 0927  BP: (!) 156/52 (!) 167/53 (!) 155/44   Pulse: (!) 59 (!) 59 61 (!) 56  Resp: Temp:  97.8 F (36.6 C) 97.9 F (36.6 C)   TempSrc:  Oral Oral   SpO2: 98% 100% 100%   Weight:  187 lb 3.2 oz (84.9 kg)    Height:        Intake/Output Summary (Last 24 hours) at 06/24/16 1104 Last data filed at 06/24/16 0930  Gross per 24 hour  Intake              530 ml  Output             2800 ml  Net            -2270 ml   Filed Weights   06/22/16 0417 06/23/16 0540  06/24/16 0622  Weight: 191 lb 11.2 oz (87 kg) 170 lb 11.2 oz (77.4 kg) 187 lb 3.2 oz (84.9 kg)    Physical Exam   GEN: Well nourished, well developed, in no acute distress. obese HEENT: Grossly normal.  Neck: Supple, + JVD, carotid bruits, or masses. Cardiac: RRR, +murmurs, S3, no rubs. No clubbing, cyanosis. 1+ LE edema bilaterally  Radials/DP/PT 2+ and equal bilaterally.  Respiratory:  Respirations regular and unlabored, clear to auscultation bilaterally. GI: Soft, nontender, nondistended, BS + x 4. MS: no deformity or atrophy. Skin: warm and dry, no rash. Neuro:  Strength and sensation are intact. Psych: AAOx3.  Normal affect.  Labs    CBC  Recent Labs  06/22/16 0039 06/24/16 0320  WBC 7.2 6.1  NEUTROABS 5.8  --   HGB 10.8* 9.9*  HCT 34.8* 30.9*  MCV 98.6 97.5  PLT 171 142*   Basic Metabolic Panel  Recent Labs  06/23/16 0556 06/24/16 0320  NA 140 142  K 3.8 3.7  CL 104 98*  CO2 31 32  GLUCOSE 94 91  BUN 43* 43*  CREATININE 1.68* 1.75*  CALCIUM 8.4* 8.4*  MG  --  2.1   Liver Function Tests  Recent Labs  06/22/16 0039  AST 29  ALT 16  ALKPHOS 59  BILITOT 0.5  PROT 5.9*  ALBUMIN 2.8*   No results for input(s): LIPASE, AMYLASE in the last 72 hours. Cardiac Enzymes  Recent Labs  06/23/16 0001 06/23/16 0556 06/23/16 1329  TROPONINI 2.01* 1.71* 1.15*     Telemetry    NSR - Personally Reviewed  ECG    4/16: Sinus bradycardia, HR 57,  with 1st degree AV Block and ST abnormalities along the anterior leads appearing resolved when compared to prior tracings - Personally Reviewed  Radiology    No results found.  Cardiac Studies   2D ECHO: 06/23/2016 LV EF: 60% -   65% Study Conclusions - Left ventricle: The cavity size was normal. Wall thickness was   increased in a pattern of moderate LVH. Systolic function was   normal. The estimated ejection fraction was in the range of 60%   to 65%. Wall motion was normal; there were no regional  wall   motion abnormalities. Features are consistent with a pseudonormal   left ventricular filling pattern, with concomitant abnormal   relaxation and increased filling pressure (grade 2 diastolic   dysfunction). - Aortic valve: Mildly calcified annulus. Moderately thickened,   moderately calcified leaflets. - Mitral valve: Mildly calcified annulus. Moderately thickened,   moderately calcified leaflets . The findings are consistent with   mild to moderate stenosis. There was moderate regurgitation.   Valve area by pressure half-time: 1.34 cm^2. - Left atrium: The atrium was moderately dilated.   Patient Profile     Bethany Nelson is a 83 y.o. female with past medical history of chronic diastolic CHF (normal EF with G3DD by echo in 02/2016), IDDM, Stage 3 CKD, dementia, HLD and HTN who presented to [facility] on 06/22/16 with SOB, swelling, and wt gain. Cardiology asked to consult for help managing CHF and for evaluation of elevated troponin.   Assessment & Plan    1. Acute on Chronic Diastolic CHF - echo in 02/2016 showed a normal EF with G3 DD. Weights have been fluctuating at home, peaking at 195 lbs two weeks ago with her requiring increased Lasix dosing along the addition of Metolazone.  - presented with acute worsening dyspnea. BNP 694. CXR showing cardiomegaly with CHF and bilateral pleural effusions. - started on IV Lasix  BID with only a net output of -2.9L. Weight down 191--> 187 lbs. We probably  need to reset her estimated dry weight lower (at hospital DC last December was 183-184 lb, suspect she may have lost additional real weight and we should shoot for 180 or so). - She was only on lasix  daily as an outpatient. This will likely need to be increased to at least  daily.   2. Elevated Troponin - initial troponin 0.28 with repeat values at 0.83-->  1.69 --> 2.29-->  2.01--> 1.71--> 1.15. Initial ECG with transient ST depression anterolaterally on background of minor  IVCD and nonspecific repol changes - repeat echo shows continued normal EF with no regional WMAs. While it is very likely that she has underlying CAD, we would like to avoid workup for ischemia based on her advanced age, poor functional status, moderate to severe CKD, absence of angina - continue treatment for presumed CAD with ASA,  statin, and BB.   3. Stage 3 CKD: baseline ~ 1.5. Creatinine at 1.85 on admission. Creat has remained stable 1.68--> 1.75. Continue to monitor with ongoing IV diuresis.   4. HTN: BP has been mild to moderately elevated. AM dose of lopressor held given HR 56.  If baseline bradycardia is going to limit BB use, I think we need to add another antihypertensive. Continue to monitor for now.  5. HLD: followed by PCP. Remains on Lipitor  daily.   6. Dementia: continue Aricept.    7. LBBB: he has had incomplete LBBB on previous ECGs. Now likely with progression of conduction disease.  Signed, Cline Crock, PA-C  06/24/2016, 11:04 AM    I have seen and examined the patient along with Cline Crock, PA-C.  I have reviewed the chart, notes and new data.  I agree with PA's note.  Key new complaints: breathing better, still mild orthopnea Key examination changes: yesterday weight 170 lb was clearly erroneous; 187 lb today. Slower respiratory rate today, lower JVD Key new findings / data: troponin trending down , creat essentially unchanged  PLAN: Continue IV diuretics today, anticipate we will be able to switch to PO diuretics tomorrow, possibly DC in 48h. I would hold metoprolol only if HR<50. Needs PT.  Thurmon Fair, MD, [facility] [phone] 06/24/2016, 2:56 PM

## 2016-06-24 NOTE — Progress Notes (Signed)
[facility] Progress Note  Patient: Bethany Nelson WJX:[id number]   PCP: Jerl Mina, MD DOB: Feb 12, 1927   DOA: 06/22/2016   DOS: 06/24/2016   Date of Service: the patient was seen and examined on 06/24/2016  Subjective: Feeling constipated, breathing is still heavy but better. Cough is better as well  Brief hospital course: Pt. with PMH of CHF, HTN, DM,CKD ; admitted on 06/22/2016, with complaint of shortness of breath, was found to have acute on chronic diastolic CHF as well as elevated troponin. Currently further plan is continue diuresis when optimizing medications.  Assessment and Plan: 1. Acute on chronic diastolic CHF Elevated troponin. Acute hypoxic respiratory failure  Started on IV Lasix currently on 40 mg twice a day. Troponin elevated, cardiology consulted. Echo currently pending. Continue aspirin, statin, beta blocker. Monitor on telemetry. Most likely conservative management.  2. Stage III 4 chronic kidney disease. Renal function getting better, we will need to closely monitor while the patient is receiving IV diuresis.  3. Insulin-dependent DM  - A1c was 8.5% in 2013  - Managed at home with pioglitazone and Lantus 20 units qAM  - Check CBG with meals and qHS  - Continue Lantus 20 units with Novolog 3 units with meals and moderate-intensity sliding-scale correctional    4. Hypertension  - BP mildly elevated on presentation in setting of acute CHF  - Anticipate improvement with diuresis  - Continue Lopressor; hold valsartan in light of increased creatinine   5. Dementia  - Appears to be stable  - Continue Aricept   6. Right lower extremity edema. Ultrasound Doppler negative for DVT.  Bowel regimen: last BM 06/22/2016 Diet: Cardiac and carb modified diet DVT Prophylaxis: subcutaneous Heparin  Advance goals of care discussion: Full code  Family Communication: no family was present at bedside, at the time of interview.    Disposition:  Discharge  to home versus SNF. Expected discharge date: 06/26/2016, improvement in respiratory status  Consultants: Cardiology Procedures: Echocardiogram  Antibiotics: Anti-infectives    None       Objective: Physical Exam: Vitals:   06/24/16 0622 06/24/16 0808 06/24/16 0927 06/24/16 1214  BP: (!) 167/53 (!) 155/44  (!) 159/52  Pulse: (!) 59 61 (!) 56 64  Resp: Temp: 97.8 F (36.6 C) 97.9 F (36.6 C)  98 F (36.7 C)  TempSrc: Oral Oral  Oral  SpO2: 100% 100%  92%  Weight: 84.9 kg (187 lb 3.2 oz)     Height:        Intake/Output Summary (Last 24 hours) at 06/24/16 1802 Last data filed at 06/24/16 1737  Gross per 24 hour  Intake              852 ml  Output             3600 ml  Net            -2748 ml   Filed Weights   06/22/16 0417 06/23/16 0540 06/24/16 0622  Weight: 87 kg (191 lb 11.2 oz) 77.4 kg (170 lb 11.2 oz) 84.9 kg (187 lb 3.2 oz)   General: Alert, Awake and Oriented to Time, Place and Person. Appear in mild distress, affect appropriate Eyes: PERRL, Conjunctiva normal ENT: Oral Mucosa clear moist. Neck: difficult to assess JVD, no Abnormal Mass Or lumps Cardiovascular: S1 and S2 Present, aortic systolic Murmur, Respiratory: Bilateral Air entry equal and Decreased, no use of accessory muscle, faint basal Crackles, no wheezes Abdomen: Bowel Sound  present, Soft and no tenderness Skin: no redness, no Rash, no induration Extremities: trace Pedal edema, no calf tenderness Neurologic: Grossly no focal neuro deficit. Bilaterally Equal motor strength  Data Reviewed: CBC:  Recent Labs Lab 06/22/16 0039 06/24/16 0320  WBC 7.2 6.1  NEUTROABS 5.8  --   HGB 10.8* 9.9*  HCT 34.8* 30.9*  MCV 98.6 97.5  PLT 171 142*   Basic Metabolic Panel:  Recent Labs Lab 06/22/16 0039 06/23/16 0556 06/24/16 0320  NA 140 140 142  K 4.0 3.8 3.7  CL 102 104 98*  CO2 29 31 32  GLUCOSE 383* 94 91  BUN 47* 43* 43*  CREATININE 1.85* 1.68* 1.75*  CALCIUM 8.5* 8.4* 8.4*    MG  --   --  2.1    Liver Function Tests:  Recent Labs Lab 06/22/16 0039  AST 29  ALT 16  ALKPHOS 59  BILITOT 0.5  PROT 5.9*  ALBUMIN 2.8*   No results for input(s): LIPASE, AMYLASE in the last 168 hours. No results for input(s): AMMONIA in the last 168 hours. Coagulation Profile: No results for input(s): INR, PROTIME in the last 168 hours. Cardiac Enzymes:  Recent Labs Lab 06/22/16 1053 06/22/16 1821 06/23/16 0001 06/23/16 0556 06/23/16 1329  TROPONINI 1.69* 2.29* 2.01* 1.71* 1.15*   BNP (last 3 results) No results for input(s): PROBNP in the last 8760 hours. CBG:  Recent Labs Lab 06/23/16 1647 06/23/16 2100 06/24/16 0735 06/24/16 1205 06/24/16 1653  GLUCAP 157* 111* 86 187* 165*   Studies: No results found.  Scheduled Meds: . aspirin EC  81 mg Oral QODAY  . atorvastatin  40 mg Oral Daily  . donepezil  5 mg Oral QHS  . dorzolamide-timolol  1 drop Both Eyes BID  . furosemide  40 mg Intravenous BID  . heparin  5,000 Units Subcutaneous Q8H  . insulin aspart  0-5 Units Subcutaneous QHS  . insulin aspart  0-9 Units Subcutaneous TID WC  . insulin aspart  3 Units Subcutaneous TID WC  . insulin glargine  20 Units Subcutaneous QHS  . latanoprost  1 drop Both Eyes QHS  . metoprolol tartrate  25 mg Oral BID  . polyethylene glycol  17 g Oral Daily  . sodium chloride flush  3 mL Intravenous Q12H   Continuous Infusions: . sodium chloride     PRN Meds: sodium chloride, acetaminophen, fluticasone, ondansetron (ZOFRAN) IV, sodium chloride flush  Time spent: 30 minutes  Author: Lynden Oxford, MD [facility] Pager: [phone] 06/24/2016 6:02 PM  If 7PM-7AM, please contact night-coverage at [email or web address], password [facility]

## 2016-06-24 NOTE — Progress Notes (Signed)
*  PRELIMINARY RESULTS* Vascular Ultrasound Right lower extremity venous duplex has been completed.  Preliminary findings: No evidence of DVT. Small fluid structure noted in the right popliteal fossa, possibly a baker's cyst.   Farrel Demark, RDMS, RVT  06/24/2016, 1:01 PM

## 2016-06-24 NOTE — Evaluation (Signed)
Physical Therapy Evaluation Patient Details Name: Bethany Nelson MRN: [id number] DOB: 23-Apr-1926 Today's Date: 06/24/2016   History of Present Illness  Pt adm with Acute on chronic diastolic CHF. PMH - DM, HTN, chf   Clinical Impression  Pt admitted with above diagnosis and presents to PT with functional limitations due to deficits listed below (See PT problem list). Pt needs skilled PT to maximize independence and safety to allow discharge to ST-SNF. Currently pt very weak with poor functional activity tolerance. Pt and family unsure if she can manage at home. If pt rebounds quickly she may be able to return home with family.     Follow Up Recommendations SNF (If pt progresses quickly may be able to return home)    Equipment Recommendations  None recommended by PT    Recommendations for Other Services       Precautions / Restrictions Precautions Precautions: Fall Restrictions Weight Bearing Restrictions: No      Mobility  Bed Mobility Overal bed mobility: Needs Assistance Bed Mobility: Supine to Sit     Supine to sit: Min assist     General bed mobility comments: Assist to elevate trunk into sitting.  Transfers Overall transfer level: Needs assistance Equipment used: 4-wheeled walker Transfers: Sit to/from Stand Sit to Stand: Min guard         General transfer comment: Assist for safety. Pt with incr time and effort to rise.  Ambulation/Gait Ambulation/Gait assistance: Min guard Ambulation Distance (Feet): 15 Feet Assistive device: 4-wheeled walker Gait Pattern/deviations: Step-through pattern;Decreased step length - right;Decreased step length - left;Trunk flexed     General Gait Details: Assist for balance and safety. Pt fatigues quickly. Pt with dyspnea 2/4 with activity. Pt on RA.  Stairs            Wheelchair Mobility    Modified Rankin (Stroke Patients Only)       Balance Overall balance assessment: Needs assistance Sitting-balance  support: No upper extremity supported;Feet supported Sitting balance-Leahy Scale: Good     Standing balance support: Bilateral upper extremity supported Standing balance-Leahy Scale: Poor Standing balance comment: rollator for support                             Pertinent Vitals/Pain Pain Assessment: Faces Faces Pain Scale: Hurts little more Pain Location: lt hip Pain Descriptors / Indicators: Grimacing Pain Intervention(s): Limited activity within patient's tolerance;Monitored during session;Repositioned    Home Living Family/patient expects to be discharged to:: Private residence Living Arrangements: Children Available Help at Discharge: Family;Available PRN/intermittently Type of Home: House       Home Layout: One level Home Equipment: Walker - 4 wheels;Shower seat - built in      Prior Function Level of Independence: Independent with assistive device(s)         Comments: Amb with rollator     Hand Dominance   Dominant Hand: Right    Extremity/Trunk Assessment   Upper Extremity Assessment Upper Extremity Assessment: Generalized weakness    Lower Extremity Assessment Lower Extremity Assessment: Generalized weakness       Communication   Communication: HOH  Cognition Arousal/Alertness: Awake/alert Behavior During Therapy: WFL for tasks assessed/performed Overall Cognitive Status: History of cognitive impairments - at baseline                                 General Comments: Decr short term memory  General Comments      Exercises     Assessment/Plan    PT Assessment Patient needs continued PT services  PT Problem List Decreased strength;Decreased activity tolerance;Decreased balance;Decreased mobility       PT Treatment Interventions DME instruction;Gait training;Functional mobility training;Therapeutic activities;Therapeutic exercise;Balance training;Patient/family education    PT Goals (Current goals can be  found in the Care Plan section)  Acute Rehab PT Goals Patient Stated Goal: return home PT Goal Formulation: With patient/family Time For Goal Achievement: 07/01/16 Potential to Achieve Goals: Good    Frequency Min 3X/week   Barriers to discharge        Co-evaluation               End of Session Equipment Utilized During Treatment: Gait belt Activity Tolerance: Patient limited by fatigue Patient left: in chair;with family/visitor present (sitting in chair at sink with daughter assisting with bath) Nurse Communication: Mobility status PT Visit Diagnosis: Unsteadiness on feet (R26.81);Muscle weakness (generalized) (M62.81)    Time: 4098-1191 PT Time Calculation (min) (ACUTE ONLY): 17 min   Charges:   PT Evaluation $PT Eval Moderate Complexity: 1 Procedure     PT G CodesMarland Kitchen        [facility] PT [phone]   Angelina Ok [facility] 06/24/2016, 4:25 PM

## 2016-06-25 LAB — GLUCOSE, CAPILLARY
Glucose-Capillary: 96 mg/dL (ref 65–99)
Glucose-Capillary: 195 mg/dL — ABNORMAL HIGH (ref 65–99)
Glucose-Capillary: 228 mg/dL — ABNORMAL HIGH (ref 65–99)
Glucose-Capillary: 252 mg/dL — ABNORMAL HIGH (ref 65–99)

## 2016-06-25 LAB — BASIC METABOLIC PANEL
Anion gap: 8 (ref 5–15)
BUN: 39 mg/dL — ABNORMAL HIGH (ref 6–20)
CO2: 33 mmol/L — ABNORMAL HIGH (ref 22–32)
Calcium: 8.5 mg/dL — ABNORMAL LOW (ref 8.9–10.3)
Chloride: 98 mmol/L — ABNORMAL LOW (ref 101–111)
Creatinine, Ser: 1.6 mg/dL — ABNORMAL HIGH (ref 0.44–1.00)
GFR calc Af Amer: 32 mL/min — ABNORMAL LOW (ref >60)
GFR calc non Af Amer: 28 mL/min — ABNORMAL LOW (ref >60)
Glucose, Bld: 113 mg/dL — ABNORMAL HIGH (ref 65–99)
Potassium: 3.7 mmol/L (ref 3.5–5.1)
Sodium: 139 mmol/L (ref 135–145)

## 2016-06-25 LAB — CBC
HEMATOCRIT: 34.2 % — AB (ref 36.0–46.0)
Hemoglobin: 10.7 g/dL — ABNORMAL LOW (ref 12.0–15.0)
MCH: 30 pg (ref 26.0–34.0)
MCHC: 31.3 g/dL (ref 30.0–36.0)
MCV: 95.8 fL (ref 78.0–100.0)
Platelets: 155 10*3/uL (ref 150–400)
RBC: 3.57 MIL/uL — ABNORMAL LOW (ref 3.87–5.11)
RDW: 14.6 % (ref 11.5–15.5)
WBC: 7.3 10*3/uL (ref 4.0–10.5)

## 2016-06-25 MED ORDER — FUROSEMIDE 40 MG PO TABS
40.0000 mg | ORAL_TABLET | Freq: Every day | ORAL | Status: DC
  Administered 2016-06-26: 40 mg via ORAL
  Filled 2016-06-25: qty 1

## 2016-06-25 NOTE — Progress Notes (Signed)
Physical Therapy Treatment Patient Details Name: Bethany Nelson MRN: [id number] DOB: 06-19-1926 Today's Date: 06/25/2016    History of Present Illness Pt is an 83 y/o female admitted with Acute on chronic diastolic CHF. PMH - DM, HTN, chf     PT Comments    Pt making progress with mobility and increased distance ambulated as compared to previous session. Pt stated that her daughter could, and does stay at home, if and when pt feels that she needs more assistance or is having a rough day. Pt would continue to benefit from skilled physical therapy services at this time while admitted and after d/c to address the below listed limitations in order to improve overall safety and independence with functional mobility.    Follow Up Recommendations  Home health PT;Supervision/Assistance - 24 hour;Other (comment) (pt reported that daughter could work from home if needed)     Equipment Recommendations  None recommended by PT    Recommendations for Other Services       Precautions / Restrictions Precautions Precautions: Fall Restrictions Weight Bearing Restrictions: No    Mobility  Bed Mobility Overal bed mobility: Needs Assistance Bed Mobility: Supine to Sit     Supine to sit: Min guard     General bed mobility comments: increased time, HOB elevated, min guard for safety  Transfers Overall transfer level: Needs assistance Equipment used: Rolling walker (2 wheeled) Transfers: Sit to/from Stand Sit to Stand: Min guard         General transfer comment: increased time, min guard for safety  Ambulation/Gait Ambulation/Gait assistance: Min guard Ambulation Distance (Feet): 50 Feet Assistive device: Rolling walker (2 wheeled) Gait Pattern/deviations: Step-through pattern;Decreased step length - right;Decreased step length - left;Trunk flexed Gait velocity: decreased   General Gait Details: min guard for safety, VC'ing to maintain a safe distance from RW. Pt with mild  instability but no LOB or need for physical assistance.   Stairs            Wheelchair Mobility    Modified Rankin (Stroke Patients Only)       Balance Overall balance assessment: Needs assistance Sitting-balance support: No upper extremity supported;Feet supported Sitting balance-Leahy Scale: Good     Standing balance support: Bilateral upper extremity supported Standing balance-Leahy Scale: Poor Standing balance comment: pt reliant on bilateral UEs on RW                            Cognition Arousal/Alertness: Awake/alert Behavior During Therapy: WFL for tasks assessed/performed Overall Cognitive Status: History of cognitive impairments - at baseline                                 General Comments: Decreased short term memory      Exercises      General Comments        Pertinent Vitals/Pain Pain Assessment: No/denies pain    Home Living                      Prior Function            PT Goals (current goals can now be found in the care plan section) Acute Rehab PT Goals PT Goal Formulation: With patient/family Time For Goal Achievement: 07/01/16 Potential to Achieve Goals: Good Progress towards PT goals: Progressing toward goals    Frequency    Min 3X/week  PT Plan Current plan remains appropriate    Co-evaluation             End of Session Equipment Utilized During Treatment: Gait belt Activity Tolerance: Patient tolerated treatment well Patient left: in chair;with call bell/phone within reach Nurse Communication: Mobility status PT Visit Diagnosis: Unsteadiness on feet (R26.81);Muscle weakness (generalized) (M62.81)     Time: 1610-9604 PT Time Calculation (min) (ACUTE ONLY): 16 min  Charges:  $Gait Training: 8-22 mins                    G Codes:       Bethany Nelson, Bethany Nelson, [place] [phone]    Bethany Nelson 06/25/2016, 9:53 AM

## 2016-06-25 NOTE — Progress Notes (Signed)
MD paged regarding foley removal

## 2016-06-25 NOTE — Progress Notes (Signed)
SATURATION QUALIFICATIONS: (This note is used to comply with regulatory documentation for home oxygen)  Patient Saturations on Room Air at Rest = 90-94%  Patient Saturations on Room Air while Ambulating = 79%  Patient Saturations on 2  Liters of oxygen while Ambulating = 94%  Please briefly explain why patient needs home oxygen:

## 2016-06-25 NOTE — Care Management Note (Signed)
Case Management Note  Patient Details  Name: Bethany Nelson MRN: [id number] Date of Birth: 02-Feb-1927  Subjective/Objective:    Admitted with CHF               Action/Plan: Patient lives at home with her family; CM talked to patient with her daughter at the bedside; PCP: Jerl Mina, MD; private insurance with [employer] with prescription drug coverage; pharmacy of choice is [facility]; HHC choice offered, pt chose [facility]; Lupita Leash with [facility] called for arrangements; DME - walker at home; no other DME needed at this time;  Expected Discharge Date:  Possibly 06/26/2016             Expected Discharge Plan:  Home w Home Health Services  Discharge planning Services  CM Consult   Choice offered to:  Patient, Adult Children  HH Arranged:  RN, Disease Management, PT, Nurse's Aide HH Agency:  [facility]  Status of Service:  In process, will continue to follow  Reola Mosher [phone] 06/25/2016, 11:16 AM

## 2016-06-25 NOTE — Progress Notes (Signed)
Progress Note  Patient Name: MALICIA BLASDEL Date of Encounter: 06/25/2016  Primary Cardiologist: Eldridge Dace  Subjective   Almost 6L net diuresis. Weight down > 8 lb. Renal parameters stable to slightly improved.  Inpatient Medications    Scheduled Meds: . aspirin EC  81 mg Oral QODAY  . atorvastatin  40 mg Oral Daily  . donepezil  5 mg Oral QHS  . dorzolamide-timolol  1 drop Both Eyes BID  . furosemide  40 mg Intravenous BID  . heparin  5,000 Units Subcutaneous Q8H  . insulin aspart  0-5 Units Subcutaneous QHS  . insulin aspart  0-9 Units Subcutaneous TID WC  . insulin aspart  3 Units Subcutaneous TID WC  . insulin glargine  20 Units Subcutaneous QHS  . latanoprost  1 drop Both Eyes QHS  . metoprolol tartrate  25 mg Oral BID  . polyethylene glycol  17 g Oral Daily  . sodium chloride flush  3 mL Intravenous Q12H   Continuous Infusions: . sodium chloride     PRN Meds: sodium chloride, acetaminophen, fluticasone, ondansetron (ZOFRAN) IV, sodium chloride flush   Vital Signs    Vitals:   06/24/16 0927 06/24/16 1214 06/24/16 1958 06/25/16 0539  BP:  (!) 159/52 (!) 163/53 (!) 148/48  Pulse: (!) 56 64 65 (!) 57  Resp:  Temp:  98 F (36.7 C) 98.4 F (36.9 C) 97.8 F (36.6 C)  TempSrc:  Oral Oral Oral  SpO2:  92% 93% 97%  Weight:    83.1 kg (183 lb 3.2 oz)  Height:        Intake/Output Summary (Last 24 hours) at 06/25/16 1138 Last data filed at 06/25/16 0900  Gross per 24 hour  Intake              720 ml  Output             3800 ml  Net            -3080 ml   Filed Weights   06/23/16 0540 06/24/16 0622 06/25/16 0539  Weight: 77.4 kg (170 lb 11.2 oz) 84.9 kg (187 lb 3.2 oz) 83.1 kg (183 lb 3.2 oz)    Telemetry    NSR - Personally Reviewed  Physical Exam  Comfortable even with HOB flat GEN: No acute distress.   Neck: No JVD Cardiac: RRR, no murmurs, rubs, or gallops.  Respiratory: Clear to auscultation bilaterally. GI: Soft, nontender,  non-distended  MS: No edema; No deformity. Neuro:  Nonfocal  Psych: Normal affect   Labs    Chemistry Recent Labs Lab 06/22/16 0039 06/23/16 0556 06/24/16 0320 06/25/16 0339  NA 140 140 142 139  K 4.0 3.8 3.7 3.7  CL 102 104 98* 98*  CO2 29 31 32 33*  GLUCOSE 383* 94 91 113*  BUN 47* 43* 43* 39*  CREATININE 1.85* 1.68* 1.75* 1.60*  CALCIUM 8.5* 8.4* 8.4* 8.5*  PROT 5.9*  --   --   --   ALBUMIN 2.8*  --   --   --   AST 29  --   --   --   ALT 16  --   --   --   ALKPHOS 59  --   --   --   BILITOT 0.5  --   --   --   GFRNONAA 23* 26* 25* 28*  GFRAA 27* 30* 29* 32*  ANIONGAP Hematology Recent  Labs Lab 06/22/16 0039 06/24/16 0320 06/25/16 0339  WBC 7.2 6.1 7.3  RBC 3.53* 3.17* 3.57*  HGB 10.8* 9.9* 10.7*  HCT 34.8* 30.9* 34.2*  MCV 98.6 97.5 95.8  MCH 30.6 31.2 30.0  MCHC 31.0 32.0 31.3  RDW 15.3 15.0 14.6  PLT 171 142* 155    Cardiac Enzymes Recent Labs Lab 06/22/16 1821 06/23/16 0001 06/23/16 0556 06/23/16 1329  TROPONINI 2.29* 2.01* 1.71* 1.15*    Recent Labs Lab 06/22/16 0040  TROPIPOC 0.28*     BNP Recent Labs Lab 06/22/16 0039  BNP 694.6*     Patient Profile     83 y.o. female with past medical history of chronic diastolic CHF (normal EF with G3DD by echo in 02/2016), IDDM, Stage 3 CKD, dementia, HLD and HTN who presented to [facility] on 06/22/16 with SOB, swelling, and wt gain.  Assessment & Plan    1. Acute on Chronic Diastolic CHF - substantial diuresis and improvement  - switch to PO diuretics today. Will send home with double the previous diuretic dose. Monitor for another 24h before DC. May need longer stay due to deconditioning, but PT reports she is making progress.  2. Elevated Troponin - suspect she has underlying CAD, but most likely has demand ischemia due to volume overload as cause of abnormal enzymes, rather than true acute coronary insufficiency. - repeat echo shows continued normal EF with no regional WMAs.    - Avoid workup for ischemia based on her advanced age, poor functional status, moderate to severe CKD, absence of angina - continue treatment for presumed CAD with ASA, statin, and BB.   3. Stage 3 CKD:  currently close to baseline ~ 1.5.   4. HTN: SBP has been mildly elevated, but DBP is quite low. No changes made  5. HLD: followed by PCP. Remains on Lipitor  daily.   6. Dementia: continue Aricept.   7. LBBB: longstanding incomplete LBBB, now progressed.  Signed, Thurmon Fair, MD  06/25/2016, 11:38 AM

## 2016-06-25 NOTE — Progress Notes (Signed)
[facility] Progress Note  Patient: Bethany Nelson ZOX:[id number]   PCP: Jerl Mina, MD DOB: 1926/11/21   DOA: 06/22/2016   DOS: 06/25/2016   Date of Service: the patient was seen and examined on 06/25/2016   Subjective: Patient in bed, denies any headache, no chest or abdominal pain, shortness of breath is much improved, willing to work with PT. No new focal deficits.  Brief hospital course:  Pt. with PMH of CHF, HTN, DM,CKD ; admitted on 06/22/2016, with complaint of shortness of breath, was found to have acute on chronic diastolic CHF as well as elevated troponin. Currently further plan is continue diuresis when optimizing medications.  Assessment and Plan:  1. Acute on chronic diastolic CHF , EF 60%, Elevated troponin. Acute hypoxic respiratory failure - shortness of breath much improved, currently on 2 L nasal Oxygen, Continue IV Lasix for Diuresis, Troponin Trend Is Flat and Non-ACS Pattern Likely Elevated Due To CHF Causing Demand Ischemia, Continue Aspirin, Statin and Beta Blocker. Stable on Telemetry. Advance Activity, Titrate off Oxygen Likely Discharge Tomorrow. So far - ve 5.6 L.   2. ARF on Stage III 4 chronic kidney disease. Baseline creatinine close to 1.5, ARF improving with diuresis. ARB held.  3. Insulin-dependent DM 2  -  She is currently on Lantus along with sliding scale continue to monitor   CBG (last 3)   Recent Labs  06/24/16 2051 06/25/16 0747 06/25/16 1116  GLUCAP 194* 96 195*    4. Hypertension  - BPStable on diuretics along with Lopressor, ARB held due to ARF.  5. Dementia - at baseline, continue Aricept at risk for delirium. Minimize narcotics and benzodiazepines  6. Right lower extremity edema. Negative venous duplex.    Diet: Modified heart healthy DVT Prophylaxis: SQ Heparin  Advance goals of care discussion: Full code  Family Communication: no family was present at bedside, at the time of interview.    Disposition:  Discharge to home  versus SNF. Expected discharge date: 06/26/2016, improvement in respiratory status  Consultants: Cardiology  Procedures: Echocardiogram  Left ventricle: The cavity size was normal. Wall thickness was increased in a pattern of moderate LVH. Systolic function was normal. The estimated ejection fraction was in the range of 60% to 65%. Wall motion was normal; there were no regional wall motion abnormalities. Features are consistent with a pseudonormal left ventricular filling pattern, with concomitant abnormal relaxation and increased filling pressure (grade 2 diastolic dysfunction). - Aortic valve: Mildly calcified annulus. Moderately thickened,  moderately calcified leaflets. - Mitral valve: Mildly calcified annulus. Moderately thickened, moderately calcified leaflets . The findings are consistent with mild to moderate stenosis. There was moderate regurgitation. Valve area by pressure half-time: 1.34 cm^2. - Left atrium: The atrium was moderately dilated    Antibiotics: Anti-infectives    None       Objective:  Physical Exam:  Vitals:   06/24/16 0927 06/24/16 1214 06/24/16 1958 06/25/16 0539  BP:  (!) 159/52 (!) 163/53 (!) 148/48  Pulse: (!) 56 64 65 (!) 57  Resp:  Temp:  98 F (36.7 C) 98.4 F (36.9 C) 97.8 F (36.6 C)  TempSrc:  Oral Oral Oral  SpO2:  92% 93% 97%  Weight:    83.1 kg (183 lb 3.2 oz)  Height:        Intake/Output Summary (Last 24 hours) at 06/25/16 1131 Last data filed at 06/25/16 0900  Gross per 24 hour  Intake  720 ml  Output             3800 ml  Net            -3080 ml   Filed Weights   06/23/16 0540 06/24/16 0622 06/25/16 0539  Weight: 77.4 kg (170 lb 11.2 oz) 84.9 kg (187 lb 3.2 oz) 83.1 kg (183 lb 3.2 oz)    Awake Alert, Oriented X 3, No new F.N deficits, Normal affect Butters.AT,PERRAL Supple Neck,No JVD, No cervical lymphadenopathy appriciated.  Symmetrical Chest wall movement, Good air movement bilaterally, few  rales RRR,No Gallops,Rubs or new Murmurs, No Parasternal Heave +ve B.Sounds, Abd Soft, No tenderness, No organomegaly appriciated, No rebound - guarding or rigidity. No Cyanosis, Clubbing, trace edema, No new Rash or bruise   Data Reviewed: CBC:  Recent Labs Lab 06/22/16 0039 06/24/16 0320 06/25/16 0339  WBC 7.2 6.1 7.3  NEUTROABS 5.8  --   --   HGB 10.8* 9.9* 10.7*  HCT 34.8* 30.9* 34.2*  MCV 98.6 97.5 95.8  PLT 171 142* 155   Basic Metabolic Panel:  Recent Labs Lab 06/22/16 0039 06/23/16 0556 06/24/16 0320 06/25/16 0339  NA 140 140 142 139  K 4.0 3.8 3.7 3.7  CL 102 104 98* 98*  CO2 29 31 32 33*  GLUCOSE 383* 94 91 113*  BUN 47* 43* 43* 39*  CREATININE 1.85* 1.68* 1.75* 1.60*  CALCIUM 8.5* 8.4* 8.4* 8.5*  MG  --   --  2.1  --     Liver Function Tests:  Recent Labs Lab 06/22/16 0039  AST 29  ALT 16  ALKPHOS 59  BILITOT 0.5  PROT 5.9*  ALBUMIN 2.8*   No results for input(s): LIPASE, AMYLASE in the last 168 hours. No results for input(s): AMMONIA in the last 168 hours. Coagulation Profile: No results for input(s): INR, PROTIME in the last 168 hours. Cardiac Enzymes:  Recent Labs Lab 06/22/16 1053 06/22/16 1821 06/23/16 0001 06/23/16 0556 06/23/16 1329  TROPONINI 1.69* 2.29* 2.01* 1.71* 1.15*   BNP (last 3 results) No results for input(s): PROBNP in the last 8760 hours. CBG:  Recent Labs Lab 06/24/16 1205 06/24/16 1653 06/24/16 2051 06/25/16 0747 06/25/16 1116  GLUCAP 187* 165* 194* 96 195*   Studies: No results found.  Scheduled Meds: . aspirin EC  81 mg Oral QODAY  . atorvastatin  40 mg Oral Daily  . donepezil  5 mg Oral QHS  . dorzolamide-timolol  1 drop Both Eyes BID  . furosemide  40 mg Intravenous BID  . heparin  5,000 Units Subcutaneous Q8H  . insulin aspart  0-5 Units Subcutaneous QHS  . insulin aspart  0-9 Units Subcutaneous TID WC  . insulin aspart  3 Units Subcutaneous TID WC  . insulin glargine  20 Units  Subcutaneous QHS  . latanoprost  1 drop Both Eyes QHS  . metoprolol tartrate  25 mg Oral BID  . polyethylene glycol  17 g Oral Daily  . sodium chloride flush  3 mL Intravenous Q12H   Continuous Infusions: . sodium chloride     PRN Meds: sodium chloride, acetaminophen, fluticasone, ondansetron (ZOFRAN) IV, sodium chloride flush  Time spent: 30 minutes  Signature  Susa Raring M.D on 06/25/2016 at 11:31 AM  Between 7am to 7pm - Pager - [phone] ( page via [facility], text pages only, please mention full 10 digit call back number).  After 7pm go to [email or web address] - password [facility]

## 2016-06-26 ENCOUNTER — Telehealth: Payer: Self-pay | Admitting: Physician Assistant

## 2016-06-26 LAB — BASIC METABOLIC PANEL
Anion gap: 8 (ref 5–15)
BUN: 39 mg/dL — ABNORMAL HIGH (ref 6–20)
CHLORIDE: 99 mmol/L — AB (ref 101–111)
CO2: 32 mmol/L (ref 22–32)
CREATININE: 1.66 mg/dL — AB (ref 0.44–1.00)
Calcium: 8.3 mg/dL — ABNORMAL LOW (ref 8.9–10.3)
GFR calc Af Amer: 31 mL/min — ABNORMAL LOW (ref >60)
GFR, EST NON AFRICAN AMERICAN: 26 mL/min — AB (ref >60)
Glucose, Bld: 121 mg/dL — ABNORMAL HIGH (ref 65–99)
Potassium: 3.6 mmol/L (ref 3.5–5.1)
SODIUM: 139 mmol/L (ref 135–145)

## 2016-06-26 LAB — BRAIN NATRIURETIC PEPTIDE: B NATRIURETIC PEPTIDE 5: 618.7 pg/mL — AB (ref 0.0–100.0)

## 2016-06-26 LAB — GLUCOSE, CAPILLARY
GLUCOSE-CAPILLARY: 92 mg/dL (ref 65–99)
Glucose-Capillary: 162 mg/dL — ABNORMAL HIGH (ref 65–99)

## 2016-06-26 MED ORDER — ISOSORBIDE MONONITRATE ER 30 MG PO TB24: 30.0000 mg | ORAL_TABLET | Freq: Every day | ORAL | 0 refills | Status: DC

## 2016-06-26 MED ORDER — POTASSIUM CHLORIDE ER 20 MEQ PO TBCR: 20.0000 meq | EXTENDED_RELEASE_TABLET | Freq: Every day | ORAL | 0 refills | Status: DC

## 2016-06-26 MED ORDER — FUROSEMIDE 40 MG PO TABS: 40.0000 mg | ORAL_TABLET | Freq: Every day | ORAL | 0 refills | Status: DC

## 2016-06-26 NOTE — Progress Notes (Signed)
Progress Note  Patient Name: Bethany Nelson Date of Encounter: 06/26/2016  Primary Cardiologist: Dr. Eldridge Dace  Subjective   Net diuresis of 7 L Weight down > 8 lb Creatinine close to baseline 1.5  PT recommends home health and PT services May need evaluation for home O2 as yesterday her O2 sats dropped to 79% with ambulation.  Patient is pleasantly demented, appears comfortable laying in bed eating breakfast. Reports she reckons she feels really good today. No complaints. No family members present.  Inpatient Medications    Scheduled Meds: . aspirin EC  81 mg Oral QODAY  . atorvastatin  40 mg Oral Daily  . donepezil  5 mg Oral QHS  . dorzolamide-timolol  1 drop Both Eyes BID  . furosemide  40 mg Oral Daily  . heparin  5,000 Units Subcutaneous Q8H  . insulin aspart  0-5 Units Subcutaneous QHS  . insulin aspart  0-9 Units Subcutaneous TID WC  . insulin aspart  3 Units Subcutaneous TID WC  . insulin glargine  20 Units Subcutaneous QHS  . latanoprost  1 drop Both Eyes QHS  . metoprolol tartrate  25 mg Oral BID  . polyethylene glycol  17 g Oral Daily  . sodium chloride flush  3 mL Intravenous Q12H   Continuous Infusions: . sodium chloride     PRN Meds: sodium chloride, acetaminophen, fluticasone, ondansetron (ZOFRAN) IV, sodium chloride flush   Vital Signs    Vitals:   06/25/16 1203 06/25/16 2039 06/25/16 2230 06/26/16 0613  BP: (!) 140/40 (!) 140/48  (!) 141/54  Pulse: (!) 53 (!) 58 (!) 59 (!) 55  Resp: Temp: 97.4 F (36.3 C) 98 F (36.7 C)  98.2 F (36.8 C)  TempSrc: Oral Oral  Oral  SpO2: 98% 95%  95%  Weight:    183 lb 14.4 oz (83.4 kg)  Height:        Intake/Output Summary (Last 24 hours) at 06/26/16 0816 Last data filed at 06/26/16 0600  Gross per 24 hour  Intake              240 ml  Output             1600 ml  Net            -1360 ml   Filed Weights   06/24/16 0622 06/25/16 0539 06/26/16 0613  Weight: 187 lb 3.2 oz (84.9 kg) 183 lb  3.2 oz (83.1 kg) 183 lb 14.4 oz (83.4 kg)    Telemetry    NSR - Personally Reviewed  ECG    4/16: Sinus bradycardia, HR 57, with 1st degree AV Block and ST abnormalities along the anterior leads appearing resolved when compared to prior tracings - Personally Reviewed  Physical Exam   Able to la flat comfortably GEN: No acute distress.   Neck: No JVD Cardiac: RRR, no murmurs, rubs, or gallops.  Respiratory: Clear to auscultation bilaterally GI: Soft, nontender, non-distended  MS: No edema; No deformity. Neuro:  Nonfocal  Psych: Normal affect   Labs    Chemistry Recent Labs Lab 06/22/16 0039  06/24/16 0320 06/25/16 0339 06/26/16 0310  NA 140  < > 142 139 139  K 4.0  < > 3.7 3.7 3.6  CL 102  < > 98* 98* 99*  CO2 29  < > 32 33* 32  GLUCOSE 383*  < > 91 113* 121*  BUN 47*  < > 43* 39* 39*  CREATININE 1.85*  < >  1.75* 1.60* 1.66*  CALCIUM 8.5*  < > 8.4* 8.5* 8.3*  PROT 5.9*  --   --   --   --   ALBUMIN 2.8*  --   --   --   --   AST 29  --   --   --   --   ALT 16  --   --   --   --   ALKPHOS 59  --   --   --   --   BILITOT 0.5  --   --   --   --   GFRNONAA 23*  < > 25* 28* 26*  GFRAA 27*  < > 29* 32* 31*  ANIONGAP 9  < > < > = values in this interval not displayed.   Hematology Recent Labs Lab 06/22/16 0039 06/24/16 0320 06/25/16 0339  WBC 7.2 6.1 7.3  RBC 3.53* 3.17* 3.57*  HGB 10.8* 9.9* 10.7*  HCT 34.8* 30.9* 34.2*  MCV 98.6 97.5 95.8  MCH 30.6 31.2 30.0  MCHC 31.0 32.0 31.3  RDW 15.3 15.0 14.6  PLT 171 142* 155    Cardiac Enzymes Recent Labs Lab 06/22/16 1821 06/23/16 0001 06/23/16 0556 06/23/16 1329  TROPONINI 2.29* 2.01* 1.71* 1.15*    Recent Labs Lab 06/22/16 0040  TROPIPOC 0.28*     BNP Recent Labs Lab 06/22/16 0039 06/26/16 0310  BNP 694.6* 618.7*      Radiology    Dg Chest Portable 1 View  Result Date: 06/22/2016 CLINICAL DATA:  Dyspnea and wheeze EXAM: PORTABLE CHEST 1 VIEW COMPARISON:  None. FINDINGS:  Cardiomegaly with pulmonary vascular redistribution and small bilateral pleural effusions left greater than right are consistent with CHF. Aortic atherosclerosis without aneurysm. No acute osseous abnormality. Osteoarthritis with spurring about the left glenohumeral joint. IMPRESSION: Cardiomegaly with CHF and bilateral small pleural effusions. Aortic atherosclerosis. Electronically Signed   By: Tollie Eth M.D.   On: 06/22/2016 01:26   Cardiac Studies   2D ECHO: 06/23/2016 LV EF: 60% - 65% Study Conclusions - Left ventricle: The cavity size was normal. Wall thickness was increased in a pattern of moderate LVH. Systolic function was normal. The estimated ejection fraction was in the range of 60% to 65%. Wall motion was normal; there were no regional wall motion abnormalities. Features are consistent with a pseudonormal left ventricular filling pattern, with concomitant abnormal relaxation and increased filling pressure (grade 2 diastolic dysfunction). - Aortic valve: Mildly calcified annulus. Moderately thickened, moderately calcified leaflets. - Mitral valve: Mildly calcified annulus. Moderately thickened, moderately calcified leaflets . The findings are consistent with mild to moderate stenosis. There was moderate regurgitation. Valve area by pressure half-time: 1.34 cm^2. - Left atrium: The atrium was moderately dilated  Patient Profile   Bethany Furgeson Burtonis a 83 y.o.femalewith past medical history of chronic diastolic CHF (normal EF with G3DD by echo in 02/2016), IDDM, Stage 3 CKD, dementia, HLD and HTN who presented to [facility] on 06/22/16 with SOB, swelling, and wt gain. Cardiology asked to consult for help managing CHF and for evaluation of elevated troponin.   Assessment & Plan     1. Acute on Chronic Diastolic CHF: - Diuresis of > 7 L of fluids during this admission with a dry weight of 183. - She was switched to 40 mg PO lasix yesterday, which is the  recommended dose for discharge, has tolerated this well. May need 40 mg Lasix PO BID for home??? - Has not been ambulated  by PT  today to assess for improved strength. Plan is still  likely discharge today with home health and PT services. -  Her oxygen saturations at rest are 90-94% but it is concerning that her oxygen level drops to 79% with ambulating on room air. Patient may need home oxygen if she continues to drop her O2 sats when walking   2. Elevated Troponin: - We suspect that her elevated Troponin levels are likely due to demand ischemia as opposed to true acute coronary insufficiency. However, she likely has underlying CAD but we would like to avoid ischemic workup based on patients advanced age, poor functional status, moderate to severe CKD, absence of angina. We recommend continuing treatment for presumed CAD with ASA, statin, and BB. --repeat echo on this admission shows normal EF with no regional WMAs  3. Stage 3 CKD: - Baseline is ~ 1.5 - Creatinine at 1.85 on admission 1.75--> 1.60 --> 1.66 (today) - Creatinine currently close to baseline of ~1.5  4. HTN:  -- Systolic blood pressure continues to be mildly elevated, HR between 53-59 morning dose of Lopressor has not been given yet.   --Concerns with the BB dropping her heart rate which may limit use BB.   5. HLD: on Lipitor 40 mg daily to be followed by PCP.  6. Dementia: Continue Aricept  7. LBBB: Previous EKGs show incomplete with progression of conduction disease.   Signed, Dorthula Matas, PA-C  06/26/2016, 8:16 AM     I have seen and examined the patient along with GREENE,TIFFANY G, PA-C .  I have reviewed the chart, notes and new data.  I agree with PA's note.  Key new complaints: feels well, urine output decreased substantially with once daily diuretic dosing Key examination changes: JVP 5-6 cm, no edema. Weight unchanged from yesterday Key new findings / data: stable renal function; BNP only slightly lower  than last week.  PLAN: Increase diuretic to twice daily. Target "dry weight" 180-183 lb. OK to DC home if PT thinks she is ready, but will need early follow up (one week, repeat BMET at that time).  Thurmon Fair, MD, [facility] [phone] 06/26/2016, 10:02 AM

## 2016-06-26 NOTE — Care Management Important Message (Signed)
Important Message  Patient Details  Name: Bethany Nelson MRN: [id number] Date of Birth: 1926-05-25   Medicare Important Message Given:  Yes    Dorena Bodo 06/26/2016, 12:35 PM

## 2016-06-26 NOTE — Discharge Summary (Signed)
CHARLISA CHAM BJY:[id number] DOB: 14-Jun-1926 DOA: 06/22/2016  PCP: Jerl Mina, MD  Admit date: 06/22/2016  Discharge date: 06/26/2016  Admitted From: Home   Disposition:  Home   Recommendations for Outpatient Follow-up:   Follow up with PCP in 1-2 weeks  PCP Please obtain BMP/CBC, 2 view CXR in 1week,  (see Discharge instructions)   PCP Please follow up on the following pending results: Monitor BMP closely   Home Health: RN<PT Equipment/Devices: None  Consultations: Cards Discharge Condition: Stable   CODE STATUS: Full   Diet Recommendation: Diet heart healthy/carb modified   Fluid restriction: 1500 mL Fluid    Chief Complaint  Patient presents with  . Shortness of Breath     Brief history of present illness from the day of admission and additional interim summary    Pt. with PMH of CHF, HTN, DM,CKD ; admitted on 06/22/2016, with complaint of shortness of breath, was found to have acute on chronic diastolic CHF as well as elevated troponin. Currently further plan is continue diuresis when optimizing medications                                                                 Hospital Course   1. Acute on chronic diastolic CHF , EF 60%, Elevated troponin. Acute hypoxic respiratory failure - shortness of breath has now resolved, currently off Oxygen, That shouldn't to oral Lasix by cardiology yesterday, continue beta blocker now close to compensated, Troponin Trend was Flat and in Non-ACS Pattern Likely Elevated Due To CHF Causing Demand Ischemia, Continue on Aspirin, Statin and Beta Blocker. He be discharged home with home PT and RN, request [facility] to monitor BMP, weight, blood pressure medication dosages and diuretic dose closely. So far - ve 7 L.   2. ARF on Stage III 4 chronic kidney disease. Baseline  creatinine close to 1.5, ARF improving with diuresis. ARB held upon discharge, request PCP to continue monitoring BMP with diuresis.  3. Insulin-dependent DM 2  -   and tinea home regimen  Lab Results  Component Value Date   HGBA1C 8.5 (H) 04/08/2011   CBG (last 3)   Recent Labs  06/25/16 1645 06/25/16 2131 06/26/16 0740  GLUCAP 228* 252* 92     4. Hypertension  - BP Stable on diuretics along with Lopressor and have added Imdur for better control, ARB held due to ARF.  5. Dementia - at baseline, continue Aricept at risk for delirium. Minimize narcotics and benzodiazepines  6. Right > Left  lower extremity edema. Negative venous duplex.     Discharge diagnosis     Principal Problem:   Acute on chronic diastolic CHF (congestive heart failure) (HCC) Active Problems:   HTN (hypertension)   DM2 (diabetes mellitus, type 2) (HCC)   CKD (chronic kidney disease),  stage IV (HCC)   Elevated troponin   Acute respiratory failure with hypoxia [facility])    Discharge instructions    Discharge Instructions    Discharge instructions    Complete by:  As directed    Follow with Primary MD Jerl Mina, MD in 7 days   Get CBC, CMP, 2 view Chest X ray checked  by Primary MD or SNF MD in 5-7 days ( we routinely change or add medications that can affect your baseline labs and fluid status, therefore we recommend that you get the mentioned basic workup next visit with your PCP, your PCP may decide not to get them or add new tests based on their clinical decision)  Activity: As tolerated with Full fall precautions use walker/cane & assistance as needed  Disposition Home    Diet:   Diet heart healthy/carb modified Fluid restriction: 1500 mL Fluid    For Heart failure patients - Check your Weight same time everyday, if you gain over 2 pounds, or you develop in leg swelling, experience more shortness of breath or chest pain, call your Primary MD immediately. Follow Cardiac Low Salt Diet and  1.5 lit/day fluid restriction.  On your next visit with your primary care physician please Get Medicines reviewed and adjusted.  Please request your Prim.MD to go over all Hospital Tests and Procedure/Radiological results at the follow up, please get all Hospital records sent to your Prim MD by signing hospital release before you go home.  If you experience worsening of your admission symptoms, develop shortness of breath, life threatening emergency, suicidal or homicidal thoughts you must seek medical attention immediately by calling 911 or calling your MD immediately  if symptoms less severe.  You Must read complete instructions/literature along with all the possible adverse reactions/side effects for all the Medicines you take and that have been prescribed to you. Take any new Medicines after you have completely understood and accpet all the possible adverse reactions/side effects.   Do not drive, operate heavy machinery, perform activities at heights, swimming or participation in water activities or provide baby sitting services if your were admitted for syncope or siezures until you have seen by Primary MD or a Neurologist and advised to do so again.  Do not drive when taking Pain medications.    Do not take more than prescribed Pain, Sleep and Anxiety Medications  Special Instructions: If you have smoked or chewed Tobacco  in the last 2 yrs please stop smoking, stop any regular Alcohol  and or any Recreational drug use.  Wear Seat belts while driving.   Please note  You were cared for by a hospitalist during your hospital stay. If you have any questions about your discharge medications or the care you received while you were in the hospital after you are discharged, you can call the unit and asked to speak with the hospitalist on call if the hospitalist that took care of you is not available. Once you are discharged, your primary care physician will handle any further medical issues.  Please note that NO REFILLS for any discharge medications will be authorized once you are discharged, as it is imperative that you return to your primary care physician (or establish a relationship with a primary care physician if you do not have one) for your aftercare needs so that they can reassess your need for medications and monitor your lab values.   Increase activity slowly    Complete by:  As directed  Discharge Medications   Allergies as of 06/26/2016   No Known Allergies     Medication List    STOP taking these medications   valsartan 320 MG tablet Commonly known as:  DIOVAN     TAKE these medications   aspirin EC 81 MG tablet Take 81 mg by mouth every other day.   atorvastatin 40 MG tablet Commonly known as:  LIPITOR Take 40 mg by mouth daily.   cetirizine 10 MG tablet Commonly known as:  ZYRTEC Take 10 mg by mouth daily as needed for allergies.   donepezil 5 MG tablet Commonly known as:  ARICEPT Take 5 mg by mouth at bedtime.   dorzolamide-timolol 22.3-6.8 MG/ML ophthalmic solution Commonly known as:  COSOPT Place 1 drop into both eyes 2 (two) times daily.   fluticasone 50 MCG/ACT nasal spray Commonly known as:  FLONASE Place 1 spray into both nostrils daily as needed for allergies.   furosemide 40 MG tablet Commonly known as:  LASIX Take 1 tablet (40 mg total) by mouth daily. What changed:  medication strength  how much to take   isosorbide mononitrate 30 MG 24 hr tablet Commonly known as:  IMDUR Take 1 tablet (30 mg total) by mouth daily.   LANTUS SOLOSTAR 100 UNIT/ML Solostar Pen Generic drug:  Insulin Glargine Inject 20 Units into the skin every morning.   latanoprost 0.005 % ophthalmic solution Commonly known as:  XALATAN Place 1 drop into both eyes at bedtime.   levothyroxine 75 MCG tablet Commonly known as:  SYNTHROID, LEVOTHROID Take 75 mcg by mouth daily before breakfast.   metoprolol tartrate 25 MG tablet Commonly known  as:  LOPRESSOR Take 25 mg by mouth 2 (two) times daily.   pioglitazone 15 MG tablet Commonly known as:  ACTOS Take 15 mg by mouth daily.   Potassium Chloride ER 20 MEQ Tbcr Take 20 mEq by mouth daily.       Follow-up Information    [facility] Follow up.   Why:  They will do your home health care at your home Contact information: [address] [place] [address] [phone]        Jerl Mina, MD. Schedule an appointment as soon as possible for a visit in 1 week(s).   Specialty:  Family Medicine Contact information: [address] [facility] [place] [address] [phone]           Major procedures and Radiology Reports - PLEASE review detailed and final reports thoroughly  -     Echocardiogram  - Left ventricle: The cavity size was normal. Wall thickness wasincreased in a pattern of moderate LVH. Systolic function wasnormal. The estimated ejection fraction was in the range of 60%to 65%. Wall motion was normal; there were no regional wallmotion abnormalities. Features are consistent with a pseudonormalleft ventricular filling pattern, with concomitant abnormalrelaxation and increased filling pressure (grade 2 diastolicdysfunction). - Aortic valve: Mildly calcified annulus. Moderately thickened,moderately calcified leaflets. - Mitral valve: Mildly calcified annulus. Moderately thickened,moderately calcified leaflets . The findings are consistent withmild to moderate stenosis. There was moderate regurgitation. Valve area by pressure half-time: 1.34 cm^2. - Left atrium: The atrium was moderately dilated    Dg Chest Portable 1 View  Result Date: 06/22/2016 CLINICAL DATA:  Dyspnea and wheeze EXAM: PORTABLE CHEST 1 VIEW COMPARISON:  None. FINDINGS: Cardiomegaly with pulmonary vascular redistribution and small bilateral pleural effusions left greater than right are consistent with CHF. Aortic atherosclerosis without  aneurysm. No  acute osseous abnormality. Osteoarthritis with spurring about the left glenohumeral joint. IMPRESSION: Cardiomegaly with CHF and bilateral small pleural effusions. Aortic atherosclerosis. Electronically Signed   By: Tollie Eth M.D.   On: 06/22/2016 01:26    Micro Results     No results found for this or any previous visit (from the past 240 hour(s)).  Today   Subjective    Bethany Nelson today has no headache,no chest abdominal pain,no new weakness tingling or numbness, feels much better wants to go home today.     Objective   Blood pressure (!) 141/54, pulse (!) 55, temperature 98.2 F (36.8 C), temperature source Oral, resp. rate 18, height  (1.575 m), weight 83.4 kg (183 lb 14.4 oz), SpO2 95 %.   Intake/Output Summary (Last 24 hours) at 06/26/16 0928 Last data filed at 06/26/16 0600  Gross per 24 hour  Intake                0 ml  Output             1600 ml  Net            -1600 ml    Exam Awake Alert, Oriented x 3, No new F.N deficits, Normal affect [place].AT,PERRAL Supple Neck,No JVD, No cervical lymphadenopathy appriciated.  Symmetrical Chest wall movement, Good air movement bilaterally, CTAB RRR,No Gallops,Rubs or new Murmurs, No Parasternal Heave +ve B.Sounds, Abd Soft, Non tender, No organomegaly appriciated, No rebound -guarding or rigidity. No Cyanosis, Clubbing or edema, No new Rash or bruise   Data Review   CBC w Diff: Lab Results  Component Value Date   WBC 7.3 06/25/2016   HGB 10.7 (L) 06/25/2016   HGB 10.8 (L) 04/14/2011   HCT 34.2 (L) 06/25/2016   HCT 32.6 (L) 04/14/2011   PLT 155 06/25/2016   PLT 261 04/14/2011   LYMPHOPCT 12 06/22/2016   LYMPHOPCT 4.5 04/14/2011   MONOPCT 4 06/22/2016   MONOPCT 2.8 04/14/2011   EOSPCT 3 06/22/2016   EOSPCT 0.0 04/14/2011   BASOPCT 0 06/22/2016   BASOPCT 0.1 04/14/2011    CMP: Lab Results  Component Value Date   NA 139 06/26/2016   NA 140 04/29/2011   K 3.6 06/26/2016   K 3.8 04/29/2011    CL 99 (L) 06/26/2016   CL 103 04/29/2011   CO2 32 06/26/2016   CO2 28 04/29/2011   BUN 39 (H) 06/26/2016   BUN 22 (H) 04/29/2011   CREATININE 1.66 (H) 06/26/2016   CREATININE 1.17 04/29/2011   PROT 5.9 (L) 06/22/2016   PROT 5.3 (L) 04/29/2011   ALBUMIN 2.8 (L) 06/22/2016   ALBUMIN 1.7 (L) 04/29/2011   BILITOT 0.5 06/22/2016   BILITOT 0.3 04/29/2011   ALKPHOS 59 06/22/2016   ALKPHOS 35 (L) 04/29/2011   AST 29 06/22/2016   AST 17 04/29/2011   ALT 16 06/22/2016   ALT 19 04/29/2011  .   Total Time in preparing paper work, data evaluation and todays exam - 35 minutes  Susa Raring M.D on 06/26/2016 at 9:28 AM  [facility]   Office  [phone]

## 2016-06-26 NOTE — Progress Notes (Signed)
Patient rested well overnight. No complaints of pain. Patient is on PO lasix. Foley removed per unit protocol. Patient is due to void.

## 2016-06-26 NOTE — Progress Notes (Signed)
Pt has orders to be discharged. Discharge instructions given to pt and daughter, neither haveadditional questions at this time. Medication regimen reviewed and pt/ daughter educated. Pt and daughter verbalized understanding and has no additional questions. Telemetry box removed. IV removed and site in good condition. Pt stable and waiting for transportation.  Wynona Neat RN

## 2016-06-26 NOTE — Discharge Instructions (Signed)
Follow with Primary MD Jerl Mina, MD in 7 days   Get CBC, CMP, 2 view Chest X ray checked  by Primary MD or SNF MD in 5-7 days ( we routinely change or add medications that can affect your baseline labs and fluid status, therefore we recommend that you get the mentioned basic workup next visit with your PCP, your PCP may decide not to get them or add new tests based on their clinical decision)  Activity: As tolerated with Full fall precautions use walker/cane & assistance as needed  Disposition Home    Diet:   Diet heart healthy/carb modified Fluid restriction: 1500 mL Fluid    For Heart failure patients - Check your Weight same time everyday, if you gain over 2 pounds, or you develop in leg swelling, experience more shortness of breath or chest pain, call your Primary MD immediately. Follow Cardiac Low Salt Diet and 1.5 lit/day fluid restriction.  On your next visit with your primary care physician please Get Medicines reviewed and adjusted.  Please request your Prim.MD to go over all Hospital Tests and Procedure/Radiological results at the follow up, please get all Hospital records sent to your Prim MD by signing hospital release before you go home.  If you experience worsening of your admission symptoms, develop shortness of breath, life threatening emergency, suicidal or homicidal thoughts you must seek medical attention immediately by calling 911 or calling your MD immediately  if symptoms less severe.  You Must read complete instructions/literature along with all the possible adverse reactions/side effects for all the Medicines you take and that have been prescribed to you. Take any new Medicines after you have completely understood and accpet all the possible adverse reactions/side effects.   Do not drive, operate heavy machinery, perform activities at heights, swimming or participation in water activities or provide baby sitting services if your were admitted for syncope or siezures  until you have seen by Primary MD or a Neurologist and advised to do so again.  Do not drive when taking Pain medications.    Do not take more than prescribed Pain, Sleep and Anxiety Medications  Special Instructions: If you have smoked or chewed Tobacco  in the last 2 yrs please stop smoking, stop any regular Alcohol  and or any Recreational drug use.  Wear Seat belts while driving.   Please note  You were cared for by a hospitalist during your hospital stay. If you have any questions about your discharge medications or the care you received while you were in the hospital after you are discharged, you can call the unit and asked to speak with the hospitalist on call if the hospitalist that took care of you is not available. Once you are discharged, your primary care physician will handle any further medical issues. Please note that NO REFILLS for any discharge medications will be authorized once you are discharged, as it is imperative that you return to your primary care physician (or establish a relationship with a primary care physician if you do not have one) for your aftercare needs so that they can reassess your need for medications and monitor your lab values.

## 2016-06-26 NOTE — Telephone Encounter (Signed)
Patient scheduled for a TOC appointment with Jacolyn Reedy on 07-03-16 :45.

## 2016-06-27 NOTE — Telephone Encounter (Signed)
Left message for pt to call.

## 2016-06-30 NOTE — Telephone Encounter (Signed)
I left a message for the patient to call. 

## 2016-07-01 NOTE — Telephone Encounter (Signed)
Patient's daughter, Bethany Nelson (on [place]), contacted regarding discharge from [facility] on 06/23/2016.  She understands to follow up with provider Jacolyn Reedy, PA-C on 07/03/2016 at 1245 at [address] office.  She understands discharge instructions? Yes  She understands medications and regiment? Yes  She understands to bring all medications to this visit? Yes  Ms. Sessoms states she is care taker of patient. She states [facility] and PT are visiting patient regularly now. She states patient is doing well, still a little weak. She confirmed appt. I advised her to call back in the mean time should they need anything. She voiced understanding and agreed with plan.

## 2016-07-03 ENCOUNTER — Encounter: Payer: Self-pay | Admitting: Physician Assistant

## 2016-07-03 ENCOUNTER — Ambulatory Visit (INDEPENDENT_AMBULATORY_CARE_PROVIDER_SITE_OTHER): Payer: Medicare PPO | Admitting: Physician Assistant

## 2016-07-03 VITALS — BP 126/62 | HR 59 | Ht 65.0 in | Wt 186.0 lb

## 2016-07-03 DIAGNOSIS — N184 Chronic kidney disease, stage 4 (severe): Secondary | ICD-10-CM | POA: Diagnosis not present

## 2016-07-03 DIAGNOSIS — I5032 Chronic diastolic (congestive) heart failure: Secondary | ICD-10-CM

## 2016-07-03 DIAGNOSIS — E782 Mixed hyperlipidemia: Secondary | ICD-10-CM | POA: Diagnosis not present

## 2016-07-03 DIAGNOSIS — I10 Essential (primary) hypertension: Secondary | ICD-10-CM

## 2016-07-03 NOTE — Patient Instructions (Addendum)
Medication Instructions:   TAKE AN EXTRA LASIX 20 MG AND TAKE AN EXTRA POTASSIUM 20 MEQ   TOMORROW  ONLY THEN RESUME BACK TO NORMAL DOSAGE   If you need a refill on your cardiac medications before your next appointment, please call your pharmacy.  Labwork: BMET AND BNP TODAY    Testing/Procedures NONE ORDERED  TODAY :  Follow-Up: IN 2 MONTHS WITH VARANASI   Any Other Special Instructions Will Be Listed Below (If Applicable).    Low-Sodium Eating Plan Sodium, which is an element that makes up salt, helps you maintain a healthy balance of fluids in your body. Too much sodium can increase your blood pressure and cause fluid and waste to be held in your body. Your health care provider or dietitian may recommend following this plan if you have high blood pressure (hypertension), kidney disease, liver disease, or heart failure. Eating less sodium can help lower your blood pressure, reduce swelling, and protect your heart, liver, and kidneys. What are tips for following this plan? General guidelines   Most people on this plan should limit their sodium intake to 1,500-2,000 mg (milligrams) of sodium each day. Reading food labels   The Nutrition Facts label lists the amount of sodium in one serving of the food. If you eat more than one serving, you must multiply the listed amount of sodium by the number of servings.  Choose foods with less than 140 mg of sodium per serving.  Avoid foods with 300 mg of sodium or more per serving. Shopping   Look for lower-sodium products, often labeled as "low-sodium" or "no salt added."  Always check the sodium content even if foods are labeled as "unsalted" or "no salt added".  Buy fresh foods.  Avoid canned foods and premade or frozen meals.  Avoid canned, cured, or processed meats  Buy breads that have less than 80 mg of sodium per slice. Cooking   Eat more home-cooked food and less restaurant, buffet, and fast food.  Avoid adding salt  when cooking. Use salt-free seasonings or herbs instead of table salt or sea salt. Check with your health care provider or pharmacist before using salt substitutes.  Cook with plant-based oils, such as canola, sunflower, or olive oil. Meal planning   When eating at a restaurant, ask that your food be prepared with less salt or no salt, if possible.  Avoid foods that contain MSG (monosodium glutamate). MSG is sometimes added to [place] food, bouillon, and some canned foods. What foods are recommended? The items listed may not be a complete list. Talk with your dietitian about what dietary choices are best for you. Grains  Low-sodium cereals, including oats, puffed wheat and rice, and shredded wheat. Low-sodium crackers. Unsalted rice. Unsalted pasta. Low-sodium bread. Whole-grain breads and whole-grain pasta. Vegetables  Fresh or frozen vegetables. "No salt added" canned vegetables. "No salt added" tomato sauce and paste. Low-sodium or reduced-sodium tomato and vegetable juice. Fruits  Fresh, frozen, or canned fruit. Fruit juice. Meats and other protein foods  Fresh or frozen (no salt added) meat, poultry, seafood, and fish. Low-sodium canned tuna and salmon. Unsalted nuts. Dried peas, beans, and lentils without added salt. Unsalted canned beans. Eggs. Unsalted nut butters. Dairy  Milk. Soy milk. Cheese that is naturally low in sodium, such as ricotta cheese, fresh mozzarella, or Swiss cheese Low-sodium or reduced-sodium cheese. Cream cheese. Yogurt. Fats and oils  Unsalted butter. Unsalted margarine with no trans fat. Vegetable oils such as canola or olive oils. Seasonings  and other foods  Fresh and dried herbs and spices. Salt-free seasonings. Low-sodium mustard and ketchup. Sodium-free salad dressing. Sodium-free light mayonnaise. Fresh or refrigerated horseradish. Lemon juice. Vinegar. Homemade, reduced-sodium, or low-sodium soups. Unsalted popcorn and pretzels. Low-salt or salt-free  chips. What foods are not recommended? The items listed may not be a complete list. Talk with your dietitian about what dietary choices are best for you. Grains  Instant hot cereals. Bread stuffing, pancake, and biscuit mixes. Croutons. Seasoned rice or pasta mixes. Noodle soup cups. Boxed or frozen macaroni and cheese. Regular salted crackers. Self-rising flour. Vegetables  Sauerkraut, pickled vegetables, and relishes. Olives. [place] fries. Onion rings. Regular canned vegetables (not low-sodium or reduced-sodium). Regular canned tomato sauce and paste (not low-sodium or reduced-sodium). Regular tomato and vegetable juice (not low-sodium or reduced-sodium). Frozen vegetables in sauces. Meats and other protein foods  Meat or fish that is salted, canned, smoked, spiced, or pickled. Bacon, ham, sausage, hotdogs, corned beef, chipped beef, packaged lunch meats, salt pork, jerky, pickled herring, anchovies, regular canned tuna, sardines, salted nuts. Dairy  Processed cheese and cheese spreads. Cheese curds. Blue cheese. Feta cheese. String cheese. Regular cottage cheese. Buttermilk. Canned milk. Fats and oils  Salted butter. Regular margarine. Ghee. Bacon fat. Seasonings and other foods  Onion salt, garlic salt, seasoned salt, table salt, and sea salt. Canned and packaged gravies. Worcestershire sauce. Tartar sauce. Barbecue sauce. Teriyaki sauce. Soy sauce, including reduced-sodium. Steak sauce. Fish sauce. Oyster sauce. Cocktail sauce. Horseradish that you find on the shelf. Regular ketchup and mustard. Meat flavorings and tenderizers. Bouillon cubes. Hot sauce and Tabasco sauce. Premade or packaged marinades. Premade or packaged taco seasonings. Relishes. Regular salad dressings. Salsa. Potato and tortilla chips. Corn chips and puffs. Salted popcorn and pretzels. Canned or dried soups. Pizza. Frozen entrees and pot pies. Summary  Eating less sodium can help lower your blood pressure, reduce swelling,  and protect your heart, liver, and kidneys.  Most people on this plan should limit their sodium intake to 1,500-2,000 mg (milligrams) of sodium each day.  Canned, boxed, and frozen foods are high in sodium. Restaurant foods, fast foods, and pizza are also very high in sodium. You also get sodium by adding salt to food.  Try to cook at home, eat more fresh fruits and vegetables, and eat less fast food, canned, processed, or prepared foods. This information is not intended to replace advice given to you by your health care provider. Make sure you discuss any questions you have with your health care provider. Document Released: 08/16/2001 Document Revised: 02/18/2016 Document Reviewed: 02/18/2016 Elsevier Interactive Patient Education  2017 [employer].

## 2016-07-03 NOTE — Progress Notes (Signed)
Cardiology Office Note    Date:  07/03/2016   ID:  Bethany Nelson, DOB 08/12/1926, MRN [id number]  PCP:  Jerl Mina, MD  Cardiologist: Dr. Eldridge Dace  Chief Complaint  Patient presents with  . Follow-up    History of Present Illness:  Bethany Nelson is a 83 y.o. female  with history of hypertension, diabetes mellitus, chronic dose systolic CHF who was just discharged 06/26/16 after hospitalization with acute on chronic diastolic CHF. LVEF 60%. She had elevated troponins that were flat and felt most likely due to demand ischemia. She diuresed 7 liters/8 lbs with a dry weight of 183 pounds and was sent home. Baseline creatinine about 1.5 ARB was held at discharge because of acute renal failure. Duplex was negative for DVT done because of right greater than left lower extremity edema.  Patient comes in today accompanied by her daughter who is her caregiver and she lives with. Her weight has been around 183 pounds on their scales but did go up a little today. Overall they feel like she's pretty well since she's coming home from the hospital. She is good in physical therapy at home. She has chronic dyspnea on exertion but no worse than discharge. No significant edema. Trying to follow low sodium diet. Weight is up to 186 pounds on our scales.    Past Medical History:  Diagnosis Date  . Chronic diastolic congestive heart failure (HCC)    a. 02/2016: echo showing normal EF, Grade 3 DD, mild MR, trivial TR  . Diabetes mellitus without complication (HCC)   . Hypertension     No past surgical history on file.  Current Medications: Outpatient Medications Prior to Visit  Medication Sig Dispense Refill  . aspirin EC 81 MG tablet Take 81 mg by mouth every other day.    Marland Kitchen atorvastatin (LIPITOR) 40 MG tablet Take 40 mg by mouth daily.    . cetirizine (ZYRTEC) 10 MG tablet Take 10 mg by mouth daily as needed for allergies.    Marland Kitchen donepezil (ARICEPT) 5 MG tablet Take 5 mg by mouth at bedtime.      . dorzolamide-timolol (COSOPT) 22.3-6.8 MG/ML ophthalmic solution Place 1 drop into both eyes 2 (two) times daily.    . fluticasone (FLONASE) 50 MCG/ACT nasal spray Place 1 spray into both nostrils daily as needed for allergies.     . furosemide (LASIX) 40 MG tablet Take 1 tablet (40 mg total) by mouth daily. 30 tablet 0  . isosorbide mononitrate (IMDUR) 30 MG 24 hr tablet Take 1 tablet (30 mg total) by mouth daily. 30 tablet 0  . LANTUS SOLOSTAR 100 UNIT/ML Solostar Pen Inject 20 Units into the skin every morning.     . latanoprost (XALATAN) 0.005 % ophthalmic solution Place 1 drop into both eyes at bedtime.    . metoprolol tartrate (LOPRESSOR) 25 MG tablet Take 12.5 mg by mouth 2 (two) times daily.     . pioglitazone (ACTOS) 15 MG tablet Take 15 mg by mouth daily.    . potassium chloride 20 MEQ TBCR Take 20 mEq by mouth daily. 30 tablet 0  . levothyroxine (SYNTHROID, LEVOTHROID) 75 MCG tablet Take 75 mcg by mouth daily before breakfast.     No facility-administered medications prior to visit.      Allergies:   Patient has no known allergies.   Social History   Social History  . Marital status: Widowed    Spouse name: N/A  . Number of children: N/A  .  Years of education: N/A   Social History Main Topics  . Smoking status: Never Smoker  . Smokeless tobacco: Never Used  . Alcohol use No  . Drug use: No  . Sexual activity: Not Asked   Other Topics Concern  . None   Social History Narrative  . None     Family History:  The patient's family history includes Esophageal cancer in her mother.   ROS:   Please see the history of present illness.    Review of Systems  Constitution: Positive for malaise/fatigue and weight gain.  HENT: Negative.   Eyes: Negative.   Cardiovascular: Positive for dyspnea on exertion and leg swelling.  Respiratory: Positive for shortness of breath and snoring.   Hematologic/Lymphatic: Negative.   Musculoskeletal: Negative.  Negative for joint pain.   Gastrointestinal: Negative.   Genitourinary: Negative.   Neurological: Negative.    All other systems reviewed and are negative.   PHYSICAL EXAM:   VS:  BP 126/62   Pulse (!) 59   Ht  (1.651 m)   Wt 186 lb (84.4 kg)   BMI 30.95 kg/m   Physical Exam  GEN: Obese, in no acute distress  Neck: Slight increase JVD, no carotid bruits, or masses Cardiac:RRR; 1/6 systolic murmur at the left sternal border, no rubs, or gallops  Respiratory:  Decreased breast sounds with bibasilar crackles GI: soft, nontender, nondistended, + BS Ext: without cyanosis, clubbing, or edema, Good distal pulses bilaterally MS: no deformity or atrophy  Psych: euthymic mood, full affect  Wt Readings from Last 3 Encounters:  07/03/16 186 lb (84.4 kg)  06/26/16 183 lb 14.4 oz (83.4 kg)  05/12/16 195 lb (88.5 kg)      Studies/Labs Reviewed:   EKG:  EKG is Not ordered today.    Recent Labs: 06/22/2016: ALT 16 06/24/2016: Magnesium 2.1 06/25/2016: Hemoglobin 10.7; Platelets 155 06/26/2016: B Natriuretic Peptide 618.7; BUN 39; Creatinine, Ser 1.66; Potassium 3.6; Sodium 139   Lipid Panel No results found for: CHOL, TRIG, HDL, CHOLHDL, VLDL, LDLCALC, LDLDIRECT  Additional studies/ records that were reviewed today include:   2D ECHO: 06/23/2016 LV EF: 60% -   65% Study Conclusions - Left ventricle: The cavity size was normal. Wall thickness was   increased in a pattern of moderate LVH. Systolic function was   normal. The estimated ejection fraction was in the range of 60%   to 65%. Wall motion was normal; there were no regional wall   motion abnormalities. Features are consistent with a pseudonormal   left ventricular filling pattern, with concomitant abnormal   relaxation and increased filling pressure (grade 2 diastolic   dysfunction). - Aortic valve: Mildly calcified annulus. Moderately thickened,   moderately calcified leaflets. - Mitral valve: Mildly calcified annulus. Moderately thickened,    moderately calcified leaflets . The findings are consistent with   mild to moderate stenosis. There was moderate regurgitation.   Valve area by pressure half-time: 1.34 cm^2. - Left atrium: The atrium was moderately dilated      ASSESSMENT:    1. Chronic diastolic heart failure (HCC)   2. Essential hypertension   3. CKD (chronic kidney disease), stage IV (HCC)   4. Mixed hyperlipidemia      PLAN:  In order of problems listed above:  Chronic diastolic heart failure with recent admission for acute on chronic CHF. Diuresed several liters/8 pounds. Echo in the hospital normal LVEF of 60% with grade 2 DD. Weight is up a couple pounds on our  scales and she does have a basilar crackles. Instructed her to take Lasix 60 mg tomorrow morning potassium 40 mEq tomorrow morning then back to Lasix 40 mg daily potassium 20 mEq daily. We'll check BMET and BNP today.F/U with Dr. Eldridge Dace in 2 months or sooner with me if needed. 2 gm sodium diet.  Essential hypertension well controlled  CK D creatinine was 1.66 at discharge. We'll check today  Mixed hyperlipidemia on Lipitor 40 mg daily    Medication Adjustments/Labs and Tests Ordered: Current medicines are reviewed at length with the patient today.  Concerns regarding medicines are outlined above.  Medication changes, Labs and Tests ordered today are listed in the Patient Instructions below. Patient Instructions  Medication Instructions:   TAKE AN EXTRA LASIX 20 MG AND TAKE AN EXTRA POTASSIUM 20 MEQ   TOMORROW  ONLY THEN RESUME BACK TO NORMAL DOSAGE   If you need a refill on your cardiac medications before your next appointment, please call your pharmacy.  Labwork: BMET AND BNP TODAY    Testing/Procedures NONE ORDERED  TODAY :  Follow-Up: IN 2 MONTHS WITH VARANASI   Any Other Special Instructions Will Be Listed Below (If Applicable).                                                                                                                                                       Signed, Jacolyn Reedy, PA-C  07/03/2016 1:35 PM    [facility] [address] [place], [place], [place]  [address] Phone: [phone]; Fax: [phone]

## 2016-07-04 ENCOUNTER — Telehealth: Payer: Self-pay | Admitting: Interventional Cardiology

## 2016-07-04 LAB — BASIC METABOLIC PANEL
BUN/Creatinine Ratio: 29 — ABNORMAL HIGH (ref 12–28)
BUN: 68 mg/dL — ABNORMAL HIGH (ref 8–27)
CALCIUM: 9.1 mg/dL (ref 8.7–10.3)
CO2: 28 mmol/L (ref 18–29)
Chloride: 94 mmol/L — ABNORMAL LOW (ref 96–106)
Creatinine, Ser: 2.31 mg/dL — ABNORMAL HIGH (ref 0.57–1.00)
GFR calc Af Amer: 21 mL/min/{1.73_m2} — ABNORMAL LOW (ref >59)
GFR, EST NON AFRICAN AMERICAN: 18 mL/min/{1.73_m2} — AB (ref >59)
GLUCOSE: 252 mg/dL — AB (ref 65–99)
POTASSIUM: 4.5 mmol/L (ref 3.5–5.2)
Sodium: 138 mmol/L (ref 134–144)

## 2016-07-04 LAB — PRO B NATRIURETIC PEPTIDE: NT-Pro BNP: 4153 pg/mL — ABNORMAL HIGH (ref 0–738)

## 2016-07-04 NOTE — Telephone Encounter (Signed)
Returned call to daughter [name] (ok per DPR)-states that patient had blood work this morning at PCP and her creatinine was 2.3 and BUN 64.   Was instructed to call and report.  Advised that blood work yesterday was consistent with results this AM and to continue plan as discussed with nurse this morning (see result note).    Repeat blood work to be drawn 5/1 by [facility] per note.    Daughter aware and verbalized understanding.

## 2016-07-04 NOTE — Telephone Encounter (Signed)
New message      Pt had labs drawn at PCP's office.  Her creatinine is 2.3 and her BUN is 64.  Pt was asked to call her cardiologist and report this lab result.  Please call

## 2016-07-09 ENCOUNTER — Telehealth: Payer: Self-pay | Admitting: Interventional Cardiology

## 2016-07-09 NOTE — Telephone Encounter (Signed)
Left message for Bethany Nelson with [facility] to call back.  Per [facility] lab a BMET or proBNP can remain at room temperature and is not required to be iced.

## 2016-07-09 NOTE — Telephone Encounter (Signed)
New Message    They did not freeze the blood that they drew for the BMET and they want to know if they should redraw it

## 2016-07-11 MED ORDER — FUROSEMIDE 40 MG PO TABS: 40.0000 mg | ORAL_TABLET | Freq: Every day | ORAL | 11 refills | Status: DC

## 2016-07-11 NOTE — Telephone Encounter (Signed)
Follow up        *STAT* If patient is at the pharmacy, call can be transferred to refill team.   1. Which medications need to be refilled? (please list name of each medication and dose if known)  Furosemide 40mg  2. Which pharmacy/location (including street and city if local pharmacy) is medication to be sent to? [facility] [address]. Do they need a 30 day or 90 day supply?  30 days

## 2016-07-11 NOTE — Telephone Encounter (Signed)
Called and spoke to daughter Bethany Nelson. She states that her mother gained 4 pounds overnight. She states that her mother went out to eat yesterday and she believes that she ate food with a lot of salt. She states that her mother weighed 185 this morning, so she gave her an extra 40 mg of lasix and an extra 20meq of potassium.   The patient weighed 186 at the OV when she saw Bethany Nelson on 4/26. Bethany Nelson checked a BMET and proBNP (Cr-2.31, K-4.5, and BNP-4153). Daughter states that she was advised for the patient to increase her mother's lasix to 60mg  QD and potassium to 40meq QD for 3 days, have [facility] recheck her labs on 07/08/16 and return to original dose of lasix and potaasium (lasix 40 mg QD and potassium 20meq QD). The daughter states that after increasing her lasix her mother's weight went down to 181.   [facility] reveal (Cr-2.03, K-5.3, BNP- 4386).   Daughter states that her mother is not SOB and does not have any lower extremity edema. Patient has an appointment with Dollene PrimroseBrittainy Simons, PA on 07/16/16.   Daughter advised to instruct patient to limit the amount of salt intake and that message would be routed to Dr. Eldridge DaceVaranasi for review.

## 2016-07-11 NOTE — Telephone Encounter (Signed)
[facility]. Noreene LarssonJill made aware that per [facility] lab BMET and proBNP can remain at room temperature and do not require icing. Noreene LarssonJill advised that she would have to check to see what their specimen collection requirements are in the field. Noreene LarssonJill verbalized understanding.

## 2016-07-11 NOTE — Telephone Encounter (Signed)
Follow up       Pt gained 4 lbs overnight.  Daughter gave pt another 40mg  of furosemide and and extra potassium this am.  Pt is up to 185 lbs.  Please advise

## 2016-07-11 NOTE — Telephone Encounter (Signed)
Pt's medication was sent to pt's pharmacy as requested. Confirmation received.  °

## 2016-07-11 NOTE — Addendum Note (Signed)
Addended by: Demetrios LollBARNARD, CATHY C on: 07/11/2016 09:29 AM   Modules accepted: Orders

## 2016-07-14 ENCOUNTER — Ambulatory Visit: Payer: Medicare PPO | Admitting: Physician Assistant

## 2016-07-14 NOTE — Telephone Encounter (Signed)
Limiting salt will be very important.  Bethany Nelson will stress this on Wednesday.

## 2016-07-15 ENCOUNTER — Encounter: Payer: Self-pay | Admitting: Cardiology

## 2016-07-15 NOTE — Telephone Encounter (Signed)
Patient's daughter made aware of Dr. Hoyle BarrVaranasi's recommendations. She states that her friend will be bringing her mother to the appointment tomorrow.

## 2016-07-16 ENCOUNTER — Telehealth: Payer: Self-pay | Admitting: Cardiology

## 2016-07-16 ENCOUNTER — Encounter: Payer: Self-pay | Admitting: Cardiology

## 2016-07-16 ENCOUNTER — Telehealth: Payer: Self-pay | Admitting: Interventional Cardiology

## 2016-07-16 ENCOUNTER — Ambulatory Visit (INDEPENDENT_AMBULATORY_CARE_PROVIDER_SITE_OTHER): Payer: Medicare PPO | Admitting: Cardiology

## 2016-07-16 VITALS — BP 124/68 | HR 54 | Ht 65.0 in | Wt 186.0 lb

## 2016-07-16 DIAGNOSIS — F32A Depression, unspecified: Secondary | ICD-10-CM | POA: Insufficient documentation

## 2016-07-16 DIAGNOSIS — F329 Major depressive disorder, single episode, unspecified: Secondary | ICD-10-CM | POA: Insufficient documentation

## 2016-07-16 DIAGNOSIS — I5032 Chronic diastolic (congestive) heart failure: Secondary | ICD-10-CM

## 2016-07-16 DIAGNOSIS — M1990 Unspecified osteoarthritis, unspecified site: Secondary | ICD-10-CM | POA: Insufficient documentation

## 2016-07-16 DIAGNOSIS — E079 Disorder of thyroid, unspecified: Secondary | ICD-10-CM | POA: Insufficient documentation

## 2016-07-16 MED ORDER — FUROSEMIDE 40 MG PO TABS: 40.0000 mg | ORAL_TABLET | Freq: Every day | ORAL | 10 refills | Status: DC

## 2016-07-16 MED ORDER — POTASSIUM CHLORIDE ER 20 MEQ PO TBCR: 20.0000 meq | EXTENDED_RELEASE_TABLET | Freq: Every day | ORAL | 10 refills | Status: DC

## 2016-07-16 NOTE — Progress Notes (Signed)
07/16/2016 Bethany Nelson   02/22/1927  [id number]  Primary Physician Jerl MinaHedrick, James, MD Primary Cardiologist: Dr. Eldridge DaceVaranasi    Reason for Visit/CC: Chronic Diastolic HF  HPI:  Bethany Nelson is a 83 y.o. female with a h/o HTN, DM and diastolic dysfunction who is being seen today for the evaluation of diastolic HF. She is followed by Dr. Eldridge DaceVaranasi. She was recently admitted for acute on chronic diastolic HF and discharged 06/26/16. EF by echo was normal at 60%. She diuresed 7 liters/8 lbs with a dry weight of 183 pounds and was sent home. She was seen by Jacolyn ReedyMichele Lenze, PA-C, for post hospital f/u on 07/03/16. Her weight was up to 186 lb that visit and she was noted to have basilar crackles on exam. She was instructed to incrase lasix to 60 mg x 1 day, then return back to original dose of 40 mg daily. Low sodium diet was advised.   Since that visit, she has had fluctuations in her weight and her daugther has had to adjsut her lasix and potassium at home. When she gains >3 lb in 24 hrs, her daughter gives her extra lasix and potassium. She admits to eating out recently and has been eating moderate amounts of high sodium foods. Her weight today is 186 lb (last as previous office weight).  She has not been strict with low sodium diet. She does not use the salt shaker, however she does not pay attention closely to food labels. She sleeps with 1 pillow. She denies orthopnea and PND. No LEE today. No resting or exertional dyspnea.   Current Meds  Medication Sig  . aspirin EC 81 MG tablet Take 81 mg by mouth every other day.  Marland Kitchen. atorvastatin (LIPITOR) 40 MG tablet Take 40 mg by mouth daily.  . cetirizine (ZYRTEC) 10 MG tablet Take 10 mg by mouth daily as needed for allergies.  Marland Kitchen. donepezil (ARICEPT) 5 MG tablet Take 5 mg by mouth daily.  . dorzolamide-timolol (COSOPT) 22.3-6.8 MG/ML ophthalmic solution Place 1 drop into both eyes 2 (two) times daily.  . fluticasone (FLONASE) 50 MCG/ACT nasal spray Place 1  spray into both nostrils daily as needed for allergies.   . furosemide (LASIX) 40 MG tablet Take 1 tablet (40 mg total) by mouth daily. TAKE EXTRA TABLET IF GAIN  3 LBS IN 24 HOURS 5 LBS  IN A WEEK  . isosorbide mononitrate (IMDUR) 30 MG 24 hr tablet Take 1 tablet (30 mg total) by mouth daily.  Marland Kitchen. LANTUS SOLOSTAR 100 UNIT/ML Solostar Pen Inject 20 Units into the skin every morning.   . latanoprost (XALATAN) 0.005 % ophthalmic solution Place 1 drop into both eyes at bedtime.  . metoprolol tartrate (LOPRESSOR) 25 MG tablet Take 12.5 mg by mouth 2 (two) times daily.   . pioglitazone (ACTOS) 15 MG tablet Take 15 mg by mouth daily.  . Potassium Chloride ER 20 MEQ TBCR Take 20 mEq by mouth daily. TAKE EXTRA TABLET WITH FLUID TABLET  IF GAIN  3 LBS IN 24 HOURS 5 LBS  IN A WEEK  . [DISCONTINUED] furosemide (LASIX) 40 MG tablet Take 1 tablet (40 mg total) by mouth daily.  . [DISCONTINUED] potassium chloride 20 MEQ TBCR Take 20 mEq by mouth daily.   No Known Allergies Past Medical History:  Diagnosis Date  . Chronic diastolic congestive heart failure (HCC)    a. 02/2016: echo showing normal EF, Grade 3 DD, mild MR, trivial TR  . Diabetes mellitus without complication (  HCC)   . Hypertension    Family History  Problem Relation Age of Onset  . Esophageal cancer Mother   . Heart disease Neg Hx    No past surgical history on file. Social History   Social History  . Marital status: Widowed    Spouse name: N/A  . Number of children: N/A  . Years of education: N/A   Occupational History  . Not on file.   Social History Main Topics  . Smoking status: Never Smoker  . Smokeless tobacco: Never Used  . Alcohol use No  . Drug use: No  . Sexual activity: Not on file   Other Topics Concern  . Not on file   Social History Narrative  . No narrative on file     Review of Systems: General: negative for chills, fever, night sweats or weight changes.  Cardiovascular: negative for chest pain,  dyspnea on exertion, edema, orthopnea, palpitations, paroxysmal nocturnal dyspnea or shortness of breath Dermatological: negative for rash Respiratory: negative for cough or wheezing Urologic: negative for hematuria Abdominal: negative for nausea, vomiting, diarrhea, bright red blood per rectum, melena, or hematemesis Neurologic: negative for visual changes, syncope, or dizziness All other systems reviewed and are otherwise negative except as noted above.   Physical Exam:  Blood pressure 124/68, pulse (!) 54, height 5\' 5"  (1.651 m), weight 186 lb (84.4 kg), SpO2 99 %.  General appearance: alert, cooperative and no distress Neck: no carotid bruit and no JVD Lungs: clear to auscultation bilaterally Heart: regular rate and rhythm, S1, S2 normal, no murmur, click, rub or gallop Extremities: extremities normal, atraumatic, no cyanosis or edema Pulses: 2+ and symmetric Skin: Skin color, texture, turgor normal. No rashes or lesions Neurologic: Grossly normal  EKG not performed  -- personally reviewed   ASSESSMENT AND PLAN:   1. Chronic Diastolic HF: weight, based on our office scale, is the same as her last office weight 3 weeks ago at 186 lb. Hospital discharge weight was 183. Home weights have ranged from 182 to 185 most recently. Daughter adjust lasix and supplemental K based on daily weights. Her baseline SCr is ~2.00. We discussed continuing maintenance dose of 40 mg daily and to only increase lasix if > 3 lb weight gain in 24 hr or >5 lb gain in 1 week or development of dyspnea/LEE/ PND/orthopnea. We had a long discussion regarding how to read food labels for sodium content and avoidance of high sodium foods to avoid fluid retention/ weight gain. BP is well controlled. Continue current meds.    Follow-Up: keep f/u with Dr. Eldridge Dace 09/08/16   Robbie Lis PA-C, [facility] 07/16/2016 5:27 PM

## 2016-07-16 NOTE — Telephone Encounter (Signed)
Spoke with daughter made her aware I have not received FMLA papers. She stated she will be faxing them over.

## 2016-07-16 NOTE — Telephone Encounter (Signed)
New message     1. Are you calling in reference to your FMLA or disability form? Daughter is calling about her FMLA papers from when mother was in hosp.  2. What is your question in regards to FMLA or disability form? She has a paper that needs to be filled out. -will fax over form   3. Do you need copies of your medical records?    4. Are you waiting on a nurse to call you back with results or are you wanting copies of your results?

## 2016-07-16 NOTE — Telephone Encounter (Signed)
New Message     Pt  Is coming in today   They need a written order for the Lasix, daughter is adjusting it every day and they need written parameters to follow for patient

## 2016-07-16 NOTE — Patient Instructions (Addendum)
Medication Instructions:    IF YOU GAIN  3 LBS IN 24 HOURS OR 5 LBS  IN A WEEK    MAKE SURE YOU TAKE AN  EXTRA TABLET OF LASIX AND POTASSIUM  If you need a refill on your cardiac medications before your next appointment, please call your pharmacy.  Labwork: NONE ORDERED  TODAY    Testing/Procedures: NONE ORDERED  TODAY    Follow-Up: WITH DR Eldridge DaceVARANASI IN JULY   Any Other Special Instructions Will Be Listed Below (If Applicable).    Low-Sodium Eating Plan Sodium, which is an element that makes up salt, helps you maintain a healthy balance of fluids in your body. Too much sodium can increase your blood pressure and cause fluid and waste to be held in your body. Your health care provider or dietitian may recommend following this plan if you have high blood pressure (hypertension), kidney disease, liver disease, or heart failure. Eating less sodium can help lower your blood pressure, reduce swelling, and protect your heart, liver, and kidneys. What are tips for following this plan? General guidelines   Most people on this plan should limit their sodium intake to 1,500-2,000 mg (milligrams) of sodium each day. Reading food labels   The Nutrition Facts label lists the amount of sodium in one serving of the food. If you eat more than one serving, you must multiply the listed amount of sodium by the number of servings.  Choose foods with less than 140 mg of sodium per serving.  Avoid foods with 300 mg of sodium or more per serving. Shopping   Look for lower-sodium products, often labeled as "low-sodium" or "no salt added."  Always check the sodium content even if foods are labeled as "unsalted" or "no salt added".  Buy fresh foods.  Avoid canned foods and premade or frozen meals.  Avoid canned, cured, or processed meats  Buy breads that have less than 80 mg of sodium per slice. Cooking   Eat more home-cooked food and less restaurant, buffet, and fast food.  Avoid adding  salt when cooking. Use salt-free seasonings or herbs instead of table salt or sea salt. Check with your health care provider or pharmacist before using salt substitutes.  Cook with plant-based oils, such as canola, sunflower, or olive oil. Meal planning   When eating at a restaurant, ask that your food be prepared with less salt or no salt, if possible.  Avoid foods that contain MSG (monosodium glutamate). MSG is sometimes added to Cong[place]inese food, bouillon, and some canned foods. What foods are recommended? The items listed may not be a complete list. Talk with your dietitian about what dietary choices are best for you. Grains  Low-sodium cereals, including oats, puffed wheat and rice, and shredded wheat. Low-sodium crackers. Unsalted rice. Unsalted pasta. Low-sodium bread. Whole-grain breads and whole-grain pasta. Vegetables  Fresh or frozen vegetables. "No salt added" canned vegetables. "No salt added" tomato sauce and paste. Low-sodium or reduced-sodium tomato and vegetable juice. Fruits  Fresh, frozen, or canned fruit. Fruit juice. Meats and other protein foods  Fresh or frozen (no salt added) meat, poultry, seafood, and fish. Low-sodium canned tuna and salmon. Unsalted nuts. Dried peas, beans, and lentils without added salt. Unsalted canned beans. Eggs. Unsalted nut butters. Dairy  Milk. Soy milk. Cheese that is naturally low in sodium, such as ricotta cheese, fresh mozzarella, or Swiss cheese Low-sodium or reduced-sodium cheese. Cream cheese. Yogurt. Fats and oils  Unsalted butter. Unsalted margarine with no trans fat. Vegetable  oils such as canola or olive oils. Seasonings and other foods  Fresh and dried herbs and spices. Salt-free seasonings. Low-sodium mustard and ketchup. Sodium-free salad dressing. Sodium-free light mayonnaise. Fresh or refrigerated horseradish. Lemon juice. Vinegar. Homemade, reduced-sodium, or low-sodium soups. Unsalted popcorn and pretzels. Low-salt or salt-free  chips. What foods are not recommended? The items listed may not be a complete list. Talk with your dietitian about what dietary choices are best for you. Grains  Instant hot cereals. Bread stuffing, pancake, and biscuit mixes. Croutons. Seasoned rice or pasta mixes. Noodle soup cups. Boxed or frozen macaroni and cheese. Regular salted crackers. Self-rising flour. Vegetables  Sauerkraut, pickled vegetables, and relishes. Olives. [place] fries. Onion rings. Regular canned vegetables (not low-sodium or reduced-sodium). Regular canned tomato sauce and paste (not low-sodium or reduced-sodium). Regular tomato and vegetable juice (not low-sodium or reduced-sodium). Frozen vegetables in sauces. Meats and other protein foods  Meat or fish that is salted, canned, smoked, spiced, or pickled. Bacon, ham, sausage, hotdogs, corned beef, chipped beef, packaged lunch meats, salt pork, jerky, pickled herring, anchovies, regular canned tuna, sardines, salted nuts. Dairy  Processed cheese and cheese spreads. Cheese curds. Blue cheese. Feta cheese. String cheese. Regular cottage cheese. Buttermilk. Canned milk. Fats and oils  Salted butter. Regular margarine. Ghee. Bacon fat. Seasonings and other foods  Onion salt, garlic salt, seasoned salt, table salt, and sea salt. Canned and packaged gravies. Worcestershire sauce. Tartar sauce. Barbecue sauce. Teriyaki sauce. Soy sauce, including reduced-sodium. Steak sauce. Fish sauce. Oyster sauce. Cocktail sauce. Horseradish that you find on the shelf. Regular ketchup and mustard. Meat flavorings and tenderizers. Bouillon cubes. Hot sauce and Tabasco sauce. Premade or packaged marinades. Premade or packaged taco seasonings. Relishes. Regular salad dressings. Salsa. Potato and tortilla chips. Corn chips and puffs. Salted popcorn and pretzels. Canned or dried soups. Pizza. Frozen entrees and pot pies. Summary  Eating less sodium can help lower your blood pressure, reduce swelling,  and protect your heart, liver, and kidneys.  Most people on this plan should limit their sodium intake to 1,500-2,000 mg (milligrams) of sodium each day.  Canned, boxed, and frozen foods are high in sodium. Restaurant foods, fast foods, and pizza are also very high in sodium. You also get sodium by adding salt to food.  Try to cook at home, eat more fresh fruits and vegetables, and eat less fast food, canned, processed, or prepared foods. This information is not intended to replace advice given to you by your health care provider. Make sure you discuss any questions you have with your health care provider. Document Released: 08/16/2001 Document Revised: 02/18/2016 Document Reviewed: 02/18/2016 Elsevier Interactive Patient Education  2017 [employer].

## 2016-07-17 NOTE — Telephone Encounter (Signed)
FMLA [facility].

## 2016-08-19 ENCOUNTER — Telehealth: Payer: Self-pay | Admitting: Interventional Cardiology

## 2016-08-19 NOTE — Telephone Encounter (Signed)
New Message    Mortimer FriesBarbara Seesom Her job needs a Physicist, medicalletter for 4/20-4/27 for [employer]MLA papers, please call

## 2016-08-19 NOTE — Telephone Encounter (Signed)
Ms. Bethany Nelson returning your call.

## 2016-08-19 NOTE — Telephone Encounter (Signed)
Letter drafted and sent to patient's daughter Crosby OysterBarbara Sessoms.

## 2016-08-19 NOTE — Telephone Encounter (Signed)
Left message for Bethany Nelson to call back.

## 2016-08-19 NOTE — Telephone Encounter (Signed)
Spoke with the patient's daughter and she states that her job did not accept the FMLA paperwork for her mother because it was turned in late. She is requesting a note for her work stating that she was taking care of her mother from 06/27/16-07/04/16.

## 2016-08-27 ENCOUNTER — Telehealth: Payer: Self-pay | Admitting: Interventional Cardiology

## 2016-08-27 NOTE — Telephone Encounter (Signed)
Barabara calling and states that her FMLA was not approved. She states that the information was sent to her while she was on vacation and that she did not get it turned on time. Discussed with Medical Records and [name] made aware that we were within the time frame for processing the [facility] and that if she did not get the paperwork turned in on time that unfortunately we cannot do anything about that. [name] verbalized understanding.

## 2016-08-27 NOTE — Telephone Encounter (Signed)
Patient daughter, calling states that her mother was in the hospital from 4/15-4/19 and [name] took Friday and the following week to take care of mother. [name] took [employer]ML[employer] and states that paperwork was sent to a contractor from our office and then the paperwork was later sent to [employer] during the week on 07-03-16 which is the week she went on vacation. [name] is calling because her FMLA was denied and the letter stated that reason for the delay in getting paperwork sent did not prove to be beyond her control. Please call to discuss, thanks.

## 2016-09-07 NOTE — Progress Notes (Signed)
Cardiology Office Note   Date:  09/08/2016   ID:  Bethany Nelson, DOB [birth year], MRN [id number]  PCP:  Bethany Mina, MD    No chief complaint on file. f/u diastolic heart failure   Wt Readings from Last 3 Encounters:  09/08/16 187 lb 3.2 oz (84.9 kg)  07/16/16 186 lb (84.4 kg)  07/03/16 186 lb (84.4 kg)       History of Present Illness: Bethany Nelson is a 83 y.o. female   with medical history significant of DM, HTN.   Her son in law is Bethany Nelson.  She was admitted for diastolic heart failure in Dec, 2017.  She was diuresed at the time and sent home.  At that time,  She was wheezing and SHOB.  Her daughter took her to the hospital.   Echo in 3/18 showed:  - Left ventricle: The cavity size was normal. There was moderate concentric hypertrophy. Systolic function was normal. Wall motion was normal; there were no regional wall motion abnormalities. Doppler parameters are consistent with a reversible restrictive pattern, indicative of decreased left ventricular diastolic compliance and/or increased left atrial pressure (grade 3 diastolic dysfunction). Doppler parameters are consistent with high ventricular filling pressure. - Aortic valve: Transvalvular velocity was within the normal range. There was no stenosis. Valve area (VTI): 1.56 cm^2. Valve area (Vmax): 1.53 cm^2. Valve area (Vmean): 1.56 cm^2. - Mitral valve: Transvalvular velocity was within the normal range. There was no evidence for stenosis. There was mild regurgitation. - Left atrium: The atrium was mildly dilated. - Right ventricle: The cavity size was normal. Wall thickness was normal. Systolic function was normal. - Tricuspid valve: There was trivial regurgitation. - Pulmonary arteries: Systolic pressure was within the normal range. PA peak pressure: 34 mm Hg (S).   She was again admitted in 4/18 and diuresed.  Denies : Chest pain. Dizziness. Leg edema. Nitroglycerin use.  Orthopnea. Palpitations. Paroxysmal nocturnal dyspnea. Shortness of breath. Syncope.  She reports lightheadedness when she sits up quickly.  HR was slow, so metoprolol was decreased to 12.5 mg BID.    Daughter has been giving more Lasix at times based on the weight.    Past Medical History:  Diagnosis Date  . Chronic diastolic congestive heart failure (HCC)    a. 02/2016: echo showing normal EF, Grade 3 DD, mild MR, trivial TR  . Diabetes mellitus without complication (HCC)   . Hypertension     History reviewed. No pertinent surgical history.   Current Outpatient Prescriptions  Medication Sig Dispense Refill  . aspirin EC 81 MG tablet Take 81 mg by mouth every other day.    Marland Kitchen atorvastatin (LIPITOR) 40 MG tablet Take 40 mg by mouth daily.    . cetirizine (ZYRTEC) 10 MG tablet Take 10 mg by mouth daily as needed for allergies.    Marland Kitchen donepezil (ARICEPT) 5 MG tablet Take 5 mg by mouth daily.    . dorzolamide-timolol (COSOPT) 22.3-6.8 MG/ML ophthalmic solution Place 1 drop into both eyes 2 (two) times daily.    . fluticasone (FLONASE) 50 MCG/ACT nasal spray Place 1 spray into both nostrils daily as needed for allergies.     . furosemide (LASIX) 40 MG tablet Take 1 tablet (40 mg total) by mouth 2 (two) times daily as needed. 60 tablet 11  . isosorbide mononitrate (IMDUR) 30 MG 24 hr tablet Take 1 tablet (30 mg total) by mouth daily. 30 tablet 0  . LANTUS SOLOSTAR 100 UNIT/ML Solostar Pen  Inject 18 Units into the skin every morning.     . latanoprost (XALATAN) 0.005 % ophthalmic solution Place 1 drop into both eyes at bedtime.    . metoprolol tartrate (LOPRESSOR) 25 MG tablet Take 12.5 mg by mouth 2 (two) times daily.     . pioglitazone (ACTOS) 15 MG tablet Take 1 tablet by mouth daily.    . Potassium Chloride ER 20 MEQ TBCR Take 20 mEq by mouth daily. TAKE EXTRA TABLET WITH FLUID TABLET  IF GAIN  3 LBS IN 24 HOURS 5 LBS  IN A WEEK 45 tablet 10   No current facility-administered medications  for this visit.     Allergies:   Patient has no known allergies.    Social History:  The patient  reports that she has never smoked. She has never used smokeless tobacco. She reports that she does not drink alcohol or use drugs.   Family History:  The patient's family history includes Esophageal cancer in her mother.    ROS:  Please see the history of present illness.   Otherwise, review of systems are positive for decreased balance.   All other systems are reviewed and negative.    PHYSICAL EXAM: VS:  BP 118/60   Pulse (!) 57   Ht 5\' 5"  (1.651 m)   Wt 187 lb 3.2 oz (84.9 kg)   SpO2 99%   BMI 31.15 kg/m  , BMI Body mass index is 31.15 kg/m. GEN: Well nourished, well developed, in no acute distress  HEENT: normal  Neck: no JVD, carotid bruits, or masses Cardiac: RRR; no murmurs, rubs, or gallops,no edema  Respiratory:  clear to auscultation bilaterally, normal work of breathing GI: soft, nontender, nondistended, + BS MS: no deformity or atrophy  Skin: warm and dry, no rash; bruise on left leg Neuro:  Strength and sensation are intact Psych: euthymic mood, full affect    Recent Labs: 06/22/2016: ALT 16 06/24/2016: Magnesium 2.1 06/25/2016: Hemoglobin 10.7; Platelets 155 06/26/2016: B Natriuretic Peptide 618.7 07/03/2016: BUN 68; Creatinine, Ser 2.31; NT-Pro BNP 4,153; Potassium 4.5; Sodium 138   Lipid Panel No results found for: CHOL, TRIG, HDL, CHOLHDL, VLDL, LDLCALC, LDLDIRECT   Other studies Reviewed: Additional studies/ records that were reviewed today with results demonstrating: Hospital records from April reviewed. Labs form 4/18 reviewed as well.  Cr increased from baseline.  ASSESSMENT AND PLAN:  1. Chronic diastolic heart failure:  Appears euvolemic.  Contine low dose beta blocker.  Continue lower dose due to bradycardia.  Since she is using twice a day Lasix at times, will give a prescription for Lasix 40 twice a day. 2. Hypertensive heart disease: Blood  pressure well controlled. Continue current medicines. No need to refill isosorbide at this time. 3. CKD:  Cr was increased in 4/18.  Recheck electrolytes.  May have to tolerate higher creatinine to help keep her euvolemic. 4. DM: Continue diabetes control with primary care doctor. Her insulin was recently decreased. 5. Hyperlipidemia: Followed by her primary care doctor in [place].   Current medicines are reviewed at length with the patient today.  The patient concerns regarding her medicines were addressed.  The following changes have been made:  Increase Lasix prescription 40 mg twice a day  Labs/ tests ordered today include:   Orders Placed This Encounter  Procedures  . Basic metabolic panel    Recommend 150 minutes/week of aerobic exercise Low fat, low carb, high fiber diet recommended  Disposition:   FU in 6 months  Signed, Lance MussJayadeep Lanell Dubie, MD  09/08/2016 8:45 AM    [facility] [address], [place], [place]  [address] Phone: [phone]; Fax: [phone]

## 2016-09-08 ENCOUNTER — Ambulatory Visit (INDEPENDENT_AMBULATORY_CARE_PROVIDER_SITE_OTHER): Payer: Medicare PPO | Admitting: Interventional Cardiology

## 2016-09-08 ENCOUNTER — Encounter: Payer: Self-pay | Admitting: Interventional Cardiology

## 2016-09-08 VITALS — BP 118/60 | HR 57 | Ht 65.0 in | Wt 187.2 lb

## 2016-09-08 DIAGNOSIS — N184 Chronic kidney disease, stage 4 (severe): Secondary | ICD-10-CM | POA: Diagnosis not present

## 2016-09-08 DIAGNOSIS — I5032 Chronic diastolic (congestive) heart failure: Secondary | ICD-10-CM | POA: Diagnosis not present

## 2016-09-08 DIAGNOSIS — I130 Hypertensive heart and chronic kidney disease with heart failure and stage 1 through stage 4 chronic kidney disease, or unspecified chronic kidney disease: Secondary | ICD-10-CM | POA: Diagnosis not present

## 2016-09-08 DIAGNOSIS — E782 Mixed hyperlipidemia: Secondary | ICD-10-CM | POA: Diagnosis not present

## 2016-09-08 LAB — BASIC METABOLIC PANEL
BUN / CREAT RATIO: 30 — AB (ref 12–28)
BUN: 57 mg/dL — AB (ref 8–27)
CO2: 19 mmol/L — ABNORMAL LOW (ref 20–29)
Calcium: 9.2 mg/dL (ref 8.7–10.3)
Chloride: 108 mmol/L — ABNORMAL HIGH (ref 96–106)
Creatinine, Ser: 1.87 mg/dL — ABNORMAL HIGH (ref 0.57–1.00)
GFR calc non Af Amer: 24 mL/min/{1.73_m2} — ABNORMAL LOW (ref >59)
GFR, EST AFRICAN AMERICAN: 27 mL/min/{1.73_m2} — AB (ref >59)
Glucose: 115 mg/dL — ABNORMAL HIGH (ref 65–99)
Potassium: 4.1 mmol/L (ref 3.5–5.2)
Sodium: 144 mmol/L (ref 134–144)

## 2016-09-08 MED ORDER — FUROSEMIDE 40 MG PO TABS: 40.0000 mg | ORAL_TABLET | Freq: Two times a day (BID) | ORAL | 11 refills | Status: DC | PRN

## 2016-09-08 NOTE — Patient Instructions (Signed)
Medication Instructions:  Dr. Eldridge DaceVaranasi has sent in a new prescription for your Lasix. You can take 40 mg twice daily as needed.  Labwork: Today: BMET  Testing/Procedures: None  Follow-Up: Your physician wants you to follow-up in: 6 months with Dr. Eldridge DaceVaranasi. You will receive a reminder letter in the mail two months in advance. If you don't receive a letter, please call our office to schedule the follow-up appointment.   Any Other Special Instructions Will Be Listed Below (If Applicable).     If you need a refill on your cardiac medications before your next appointment, please call your pharmacy.

## 2016-09-12 ENCOUNTER — Telehealth: Payer: Self-pay | Admitting: Interventional Cardiology

## 2016-09-12 NOTE — Telephone Encounter (Signed)
New message   Daughter [name] returning call back to nurse on lab results.

## 2016-09-12 NOTE — Telephone Encounter (Signed)
Spoke with daughter and made aware of lab results. Daughter verbalized understanding and thankful for call back.

## 2016-10-18 ENCOUNTER — Encounter (HOSPITAL_COMMUNITY): Payer: Self-pay

## 2016-10-18 ENCOUNTER — Emergency Department (HOSPITAL_COMMUNITY)
Admission: EM | Admit: 2016-10-18 | Discharge: 2016-10-18 | Disposition: A | Discharge status: Home / Self Care | Payer: Medicare PPO | Attending: Emergency Medicine | Admitting: Emergency Medicine

## 2016-10-18 DIAGNOSIS — E162 Hypoglycemia, unspecified: Secondary | ICD-10-CM

## 2016-10-18 DIAGNOSIS — Z7982 Long term (current) use of aspirin: Secondary | ICD-10-CM | POA: Insufficient documentation

## 2016-10-18 DIAGNOSIS — E11649 Type 2 diabetes mellitus with hypoglycemia without coma: Secondary | ICD-10-CM | POA: Insufficient documentation

## 2016-10-18 DIAGNOSIS — I5032 Chronic diastolic (congestive) heart failure: Secondary | ICD-10-CM | POA: Diagnosis not present

## 2016-10-18 DIAGNOSIS — N184 Chronic kidney disease, stage 4 (severe): Secondary | ICD-10-CM | POA: Diagnosis not present

## 2016-10-18 DIAGNOSIS — I110 Hypertensive heart disease with heart failure: Secondary | ICD-10-CM | POA: Insufficient documentation

## 2016-10-18 DIAGNOSIS — Z79899 Other long term (current) drug therapy: Secondary | ICD-10-CM | POA: Insufficient documentation

## 2016-10-18 DIAGNOSIS — Z794 Long term (current) use of insulin: Secondary | ICD-10-CM | POA: Diagnosis not present

## 2016-10-18 DIAGNOSIS — I129 Hypertensive chronic kidney disease with stage 1 through stage 4 chronic kidney disease, or unspecified chronic kidney disease: Secondary | ICD-10-CM | POA: Insufficient documentation

## 2016-10-18 LAB — CBC WITH DIFFERENTIAL/PLATELET
BASOS ABS: 0 10*3/uL (ref 0.0–0.1)
Basophils Relative: 0 %
EOS PCT: 1 %
Eosinophils Absolute: 0.1 10*3/uL (ref 0.0–0.7)
HCT: 34.5 % — ABNORMAL LOW (ref 36.0–46.0)
Hemoglobin: 10.6 g/dL — ABNORMAL LOW (ref 12.0–15.0)
LYMPHS ABS: 1.1 10*3/uL (ref 0.7–4.0)
Lymphocytes Relative: 9 %
MCH: 30.7 pg (ref 26.0–34.0)
MCHC: 30.7 g/dL (ref 30.0–36.0)
MCV: 100 fL (ref 78.0–100.0)
MONO ABS: 0.4 10*3/uL (ref 0.1–1.0)
Monocytes Relative: 3 %
Neutro Abs: 10.5 10*3/uL — ABNORMAL HIGH (ref 1.7–7.7)
Neutrophils Relative %: 87 %
PLATELETS: 135 10*3/uL — AB (ref 150–400)
RBC: 3.45 MIL/uL — AB (ref 3.87–5.11)
RDW: 15.2 % (ref 11.5–15.5)
WBC: 12.1 10*3/uL — AB (ref 4.0–10.5)

## 2016-10-18 LAB — BASIC METABOLIC PANEL
Anion gap: 7 (ref 5–15)
BUN: 35 mg/dL — ABNORMAL HIGH (ref 6–20)
CALCIUM: 8.7 mg/dL — AB (ref 8.9–10.3)
CO2: 24 mmol/L (ref 22–32)
CREATININE: 1.92 mg/dL — AB (ref 0.44–1.00)
Chloride: 108 mmol/L (ref 101–111)
GFR calc Af Amer: 26 mL/min — ABNORMAL LOW (ref >60)
GFR, EST NON AFRICAN AMERICAN: 22 mL/min — AB (ref >60)
Glucose, Bld: 149 mg/dL — ABNORMAL HIGH (ref 65–99)
Potassium: 3.8 mmol/L (ref 3.5–5.1)
SODIUM: 139 mmol/L (ref 135–145)

## 2016-10-18 LAB — CBG MONITORING, ED
Glucose-Capillary: 142 mg/dL — ABNORMAL HIGH (ref 65–99)
Glucose-Capillary: 162 mg/dL — ABNORMAL HIGH (ref 65–99)
Glucose-Capillary: 198 mg/dL — ABNORMAL HIGH (ref 65–99)

## 2016-10-18 NOTE — ED Notes (Signed)
Pt CBG was 162, notified Whitney(RN)

## 2016-10-18 NOTE — ED Provider Notes (Signed)
[facility]EM[facility]ENCY DEPT Provider Note   CSN: [id number] Arrival date & time: 10/18/16  1103     History   Chief Complaint Chief Complaint  Patient presents with  . Hypoglycemia    HPI Bethany Nelson is a 83 y.o. female who presents from home after being found hypoglycemic by EMS.  EMS was unable to obtain an IV so she was given glucagon.  She Lives at home with her daughter. According to EMS patient's daughter gave her her normal Lantus however patient did not eat. Family assured her food was prepared but reportedly became distracted with a sick family member and did not assure patient ate her food.  According to EMS and later family patient is demented, is it her normal baseline, alert oriented to person and place not to time.      HPI  Past Medical History:  Diagnosis Date  . Chronic diastolic congestive heart failure (HCC)    a. 02/2016: echo showing normal EF, Grade 3 DD, mild MR, trivial TR  . Diabetes mellitus without complication (HCC)   . Hypertension     Patient Active Problem List   Diagnosis Date Noted  . Arthritis 07/16/2016  . Depression 07/16/2016  . Thyroid disease 07/16/2016  . Elevated troponin 06/22/2016  . Acute respiratory failure with hypoxia (HCC) 06/22/2016  . 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 Chronic diastolic heart failure (HCC) 05/12/2016  . Hyperlipidemia, unspecified 05/12/2016  . Acute on chronic diastolic CHF (congestive heart failure) (HCC) 02/21/2016  . Hypertension 02/21/2016  . Type 2 diabetes mellitus with stage 3 chronic kidney disease, with long-term current use of insulin (HCC) 02/21/2016  . CKD (chronic kidney disease), stage IV (HCC) 02/21/2016  . New onset of congestive heart failure (HCC) 02/21/2016    History reviewed. No pertinent surgical history.  OB History    No data available       Home  Medications    Prior to Admission medications   Medication Sig Start Date End Date Taking? Authorizing Provider  aspirin EC 81 MG tablet Take 81 mg by mouth every other day.   Yes [provider]  atorvastatin (LIPITOR) 40 MG tablet Take 40 mg by mouth daily. 01/08/16  Yes [provider]  cetirizine (ZYRTEC) 10 MG tablet Take 10 mg by mouth daily as needed for allergies.   Yes [provider]  donepezil (ARICEPT) 5 MG tablet Take 5 mg by mouth daily. 05/13/16  Yes [provider]  dorzolamide-timolol (COSOPT) 22.3-6.8 MG/ML ophthalmic solution Place 1 drop into both eyes 2 (two) times daily. 12/16/15  Yes [provider]  escitalopram (LEXAPRO) 10 MG tablet Take 10 mg by mouth daily.    Yes [provider]  fluticasone (FLONASE) 50 MCG/ACT nasal spray Place 1 spray into both nostrils daily as needed for allergies.  02/17/16  Yes [provider]  furosemide (LASIX) 40 MG tablet Take 1 tablet (40 mg total) by mouth 2 (two) times daily as needed. Patient taking differently: Take 40 mg by mouth daily. If pt weight is 3 lbs difference from day before then pt takes 80mg  09/08/16  Yes Corky Crafts, MD  LANTUS SOLOSTAR 100 UNIT/ML Solostar Pen Inject 18 Units into the skin every morning.  01/09/16  Yes [provider]  latanoprost (XALATAN) 0.005 % ophthalmic solution Place 1 drop into both eyes at bedtime. 02/15/16  Yes [provider]  losartan (COZAAR) 100 MG tablet Take 100 mg by mouth daily. 10/13/16  Yes [provider]  metoprolol tartrate (LOPRESSOR) 25 MG tablet Take 12.5 mg by mouth 2 (two) times daily.  01/09/16  Yes [provider]  pioglitazone (ACTOS) 15 MG tablet Take 15 mg by mouth daily.  07/21/16  Yes [provider]  Potassium Chloride ER 20 MEQ TBCR Take 20 mEq by mouth daily. TAKE EXTRA TABLET WITH FLUID TABLET  IF GAIN  3 LBS IN 24 HOURS 5 LBS  IN A WEEK 07/16/16  Yes Dyann KiefLenze, Michele  M, PA-C  isosorbide mononitrate (IMDUR) 30 MG 24 hr tablet Take 1 tablet (30 mg total) by mouth daily. Patient not taking: Reported on 10/18/2016 06/26/16   Leroy SeaSingh, Prashant K, MD    Family History Family History  Problem Relation Age of Onset  . Esophageal cancer Mother   . Heart disease Neg Hx     Social History Social History  Substance Use Topics  . Smoking status: Never Smoker  . Smokeless tobacco: Never Used  . Alcohol use No     Allergies   Patient has no known allergies.   Review of Systems Review of Systems  Unable to perform ROS: Dementia  Respiratory: Negative for chest tightness and shortness of breath.   Cardiovascular: Negative for chest pain.  Neurological: Negative for headaches.     Physical Exam Updated Vital Signs BP 119/65 (BP Location: Right Arm)   Pulse 62   Temp (!) 97.4 F (36.3 C) (Oral)   Resp 18   SpO2 98%   Physical Exam  Constitutional: She appears well-developed and well-nourished.  HENT:  Head: Normocephalic and atraumatic.  Right Ear: External ear normal.  Left Ear: External ear normal.  Mouth/Throat: Oropharynx is clear and moist.  Eyes: Conjunctivae are normal. Right eye exhibits no discharge. Left eye exhibits no discharge. No scleral icterus.  Neck: Normal range of motion. Neck supple.  Cardiovascular: Normal rate, regular rhythm and intact distal pulses.   No murmur heard. Pulmonary/Chest: Effort normal and breath sounds normal. No stridor. No respiratory distress.  Abdominal: Soft. She exhibits no distension. There is no tenderness.  Musculoskeletal: She exhibits no edema or deformity.  Neurological: She is alert. She exhibits normal muscle tone.  Patient is oriented to person and place, not to time.  Skin: Skin is warm and dry. She is not diaphoretic.  Psychiatric: She has a normal mood and affect. Her behavior is normal.  Nursing note and vitals reviewed.    ED Treatments / Results  Labs (all labs ordered are  listed, but only abnormal results are displayed) Labs Reviewed  BASIC METABOLIC PANEL - Abnormal; Notable for the following:       Result Value   Glucose, Bld 149 (*)    BUN 35 (*)    Creatinine, Ser 1.92 (*)    Calcium 8.7 (*)    GFR calc non Af Amer 22 (*)    GFR calc Af Amer 26 (*)    All other components within normal limits  CBC WITH DIFFERENTIAL/PLATELET - Abnormal; Notable for the following:    WBC 12.1 (*)    RBC 3.45 (*)    Hemoglobin 10.6 (*)    HCT 34.5 (*)    Platelets 135 (*)    Neutro Abs 10.5 (*)    All other components within normal limits  CBG MONITORING, ED - Abnormal; Notable for the following:    Glucose-Capillary 142 (*)  All other components within normal limits  CBG MONITORING, ED - Abnormal; Notable for the following:    Glucose-Capillary 162 (*)    All other components within normal limits  CBG MONITORING, ED - Abnormal; Notable for the following:    Glucose-Capillary 198 (*)    All other components within normal limits    EKG  EKG Interpretation None       Radiology No results found.  Procedures Procedures (including critical care time)  Medications Ordered in ED Medications - No data to display   Initial Impression / Assessment and Plan / ED Course  I have reviewed the triage vital signs and the nursing notes.  Pertinent labs & imaging results that were available during my care of the patient were reviewed by me and considered in my medical decision making (see chart for details).  Clinical Course as of Oct 18 1757  Sat Oct 18, 2016  1234 Patient re-checked, says she is feeling fine.  Patient ate [place] sandwich, applesauce, 5 packets of graham crackers, drank two containers of juice.   [EH]    Clinical Course User Index [EH] Cristina Gong, PA-C   Bethany Nelson resents from home for evaluation after she had her Lantus this morning without eating.  She was to be hypoglycemic by EMS with a CBG of 33 on EMS arrival.  She  was alert upon arrival at the hospital.  Basic labs were obtained which appear consistent with her baseline.  Patient was able to take PO intake and was observed in the ED for multiple hours with out repeat hypoglycemia episode.  Patient to be discharged home with family.  Family was given instructions to monitor patient for signs and symptoms of hypoglycemia.  Patient was able to ambulate with walker (her baseline) with out difficulty prior to discharge.     At this time there does not appear to be any evidence of an acute emergency medical condition and the patient appears stable for discharge with appropriate outpatient follow up.Diagnosis was discussed with patient who verbalizes understanding and is agreeable to discharge. Pt case discussed with Dr. Ethelda Chick who agrees with my plan.     Final Clinical Impressions(s) / ED Diagnoses   Final diagnoses:  Hypoglycemia    New Prescriptions Discharge Medication List as of 10/18/2016  1:33 PM       Cristina Gong, PA-C 10/18/16 1800    Doug Sou, MD 10/19/16 [phone]

## 2016-10-18 NOTE — ED Triage Notes (Signed)
Pt brought in by EMS due having hypoglycemic event. Pt has dementia, but at baseline. Pt CBG was 33 on arrival. CBG after glucagon was 101.

## 2016-10-18 NOTE — Discharge Instructions (Signed)
Please continue to eat higher carb foods.  If this happens again then you need to call 9-11 again.   It is important to eat every time you have your insulin.

## 2016-10-18 NOTE — ED Notes (Signed)
CBG of 142

## 2016-10-18 NOTE — ED Notes (Signed)
Pt ambulated approximately [id number] with walker. Pt ambulated without difficulty. Lyndel SafeElizabeth Hammond, [place] notified.

## 2016-10-18 NOTE — ED Provider Notes (Signed)
Patient took insulin this morning without eating. CBG per Her daughter who cares for her states that there was a momentary lapse in feeding the patient because there was another sick relative. Patient feels well since being here and being treated IM glucagon by EMS.   Doug SouJacubowitz, Huckleberry Martinson, MD 10/18/16 1246

## 2016-11-24 ENCOUNTER — Telehealth: Payer: Self-pay | Admitting: Interventional Cardiology

## 2016-11-24 ENCOUNTER — Encounter: Payer: Self-pay | Admitting: Physician Assistant

## 2016-11-24 ENCOUNTER — Encounter (INDEPENDENT_AMBULATORY_CARE_PROVIDER_SITE_OTHER): Payer: Self-pay

## 2016-11-24 ENCOUNTER — Ambulatory Visit (INDEPENDENT_AMBULATORY_CARE_PROVIDER_SITE_OTHER): Payer: Medicare PPO | Admitting: Physician Assistant

## 2016-11-24 VITALS — BP 120/72 | HR 68 | Ht 66.0 in | Wt 191.1 lb

## 2016-11-24 DIAGNOSIS — N184 Chronic kidney disease, stage 4 (severe): Secondary | ICD-10-CM | POA: Diagnosis not present

## 2016-11-24 DIAGNOSIS — I5033 Acute on chronic diastolic (congestive) heart failure: Secondary | ICD-10-CM | POA: Diagnosis not present

## 2016-11-24 DIAGNOSIS — E782 Mixed hyperlipidemia: Secondary | ICD-10-CM

## 2016-11-24 DIAGNOSIS — I10 Essential (primary) hypertension: Secondary | ICD-10-CM

## 2016-11-24 NOTE — Telephone Encounter (Signed)
New message     Pt c/o swelling: STAT is pt has developed SOB within 24 hours  1. How long have you been experiencing swelling?  A few day   2. Where is the swelling located?  feet  3.  Are you currently taking a "fluid pill"? Yes 40 mg a day   4.  Are you currently SOB?  Per daughter wheezing and runny stuffy nose   5.  Have you traveled recently no  Pt daughter wants her brought in

## 2016-11-24 NOTE — Telephone Encounter (Signed)
Patient's daughter [name] (DPR on file) calling and states that the patient has been SOB, wheezing, and has a runny nose since Saturday. When asked if the patient has been seen by primary care the daughter said no and states that she thinks it is related to her heart failure and is requesting for her mother to be seen today. She states that her mother has gained weight and is 189 lbs up from 185 lbs. She states that she has swelling in her lower extremities. She states hat her mother has been taking lasix 40 mg QD and that she gave her an extra 40 mg yesterday. She states that she has been trying to avoid salt in her diet. Daughter requesting an appointment. Patient scheduled with Jacolyn Reedy at 10:15 AM.

## 2016-11-24 NOTE — Progress Notes (Signed)
Cardiology Office Note    Date:  11/24/2016   ID:  AARIAH GODETTE, DOB 12-Oct-1926, MRN [id number]  PCP:  Jerl Mina, MD  Cardiologist: Dr. Eldridge Dace  Chief Complaint  Patient presents with  . Follow-up    Chronic systolic HF  . Shortness of Breath  . Weight Gain    History of Present Illness:  Bethany Nelson is a 83 y.o. female with history of diastolic CHF, hypertension, CK D, DM, and HLD. 2-D echo 05/2016 normal systolic function with grade 3 DD and high ventricular filling pressures. She was last admitted 06/2016 with CHF and diuresed. Metoprolol was decreased to 12.5 mg twice a day for slow heart rate. Last saw Dr. Eldridge Dace 09/08/16 and felt to be euvolemic.  Patient's daughter called in today saying she is more short of breath and had a weight gain of 5 pounds. She gave her extra Lasix yesterday.Labs from ER for hypoglycemia 10/18/16 Crt 1.92 and potassium 3.8.  Patient is brought in Today by her caregiver. Patient feels stuffy in her nose and has a lot of drainage down her throat. Her weight is up 4 pounds. She is complaining of shortness of breath with little activity. Leg edema is okay. Her daughter gave her extra Lasix yesterday and one day last week. I spoke with her on speaker phone during the visit.   Past Medical History:  Diagnosis Date  . Chronic diastolic congestive heart failure (HCC)    a. 02/2016: echo showing normal EF, Grade 3 DD, mild MR, trivial TR  . Diabetes mellitus without complication (HCC)   . Hypertension     No past surgical history on file.  Current Medications: Current Meds  Medication Sig  . aspirin EC 81 MG tablet Take 81 mg by mouth every other day.  Marland Kitchen atorvastatin (LIPITOR) 40 MG tablet Take 40 mg by mouth daily.  . cetirizine (ZYRTEC) 10 MG tablet Take 10 mg by mouth daily as needed for allergies.  Marland Kitchen donepezil (ARICEPT) 5 MG tablet Take 5 mg by mouth daily.  . dorzolamide-timolol (COSOPT) 22.3-6.8 MG/ML ophthalmic solution Place 1 drop  into both eyes 2 (two) times daily.  Marland Kitchen escitalopram (LEXAPRO) 10 MG tablet Take 10 mg by mouth daily.   . fluticasone (FLONASE) 50 MCG/ACT nasal spray Place 1 spray into both nostrils daily as needed for allergies.   . furosemide (LASIX) 40 MG tablet Take 1 tablet (40 mg total) by mouth 2 (two) times daily as needed. (Patient taking differently: Take 40 mg by mouth daily. If pt weight is 3 lbs difference from day before then pt takes )  . isosorbide mononitrate (IMDUR) 30 MG 24 hr tablet Take 1 tablet (30 mg total) by mouth daily.  Marland Kitchen LANTUS SOLOSTAR 100 UNIT/ML Solostar Pen Inject 18 Units into the skin every morning.   . latanoprost (XALATAN) 0.005 % ophthalmic solution Place 1 drop into both eyes at bedtime.  Marland Kitchen losartan (COZAAR) 100 MG tablet Take 100 mg by mouth daily.  . metoprolol tartrate (LOPRESSOR) 25 MG tablet Take 12.5 mg by mouth 2 (two) times daily.   . pioglitazone (ACTOS) 15 MG tablet Take 15 mg by mouth daily.   . Potassium Chloride ER 20 MEQ TBCR Take 20 mEq by mouth daily. TAKE EXTRA TABLET WITH FLUID TABLET  IF GAIN  3 LBS IN 24 HOURS 5 LBS  IN A WEEK     Allergies:   Patient has no known allergies.   Social History   Social  History  . Marital status: Widowed    Spouse name: N/A  . Number of children: N/A  . Years of education: N/A   Social History Main Topics  . Smoking status: Never Smoker  . Smokeless tobacco: Never Used  . Alcohol use No  . Drug use: No  . Sexual activity: Not Asked   Other Topics Concern  . None   Social History Narrative  . None     Family History:  The patient's family history includes Esophageal cancer in her mother.   ROS:   Please see the history of present illness.    Review of Systems  Constitution: Negative.  HENT: Positive for congestion.   Eyes: Negative.   Cardiovascular: Positive for dyspnea on exertion and leg swelling.  Respiratory: Positive for shortness of breath.   Hematologic/Lymphatic: Negative.     Musculoskeletal: Negative.  Negative for joint pain.  Gastrointestinal: Negative.   Genitourinary: Negative.   Neurological: Negative.    All other systems reviewed and are negative.   PHYSICAL EXAM:   VS:  BP 120/72   Pulse 68   Ht  (1.676 m)   Wt 191 lb 1.9 oz (86.7 kg)   BMI 30.85 kg/m   Physical Exam  GEN: Well nourished, well developed, in no acute distress  Neck: Slight increase JVD, no carotid bruits, or masses Cardiac:RRR; 2/6 systolic murmur at the left sternal border  Respiratory:  clear to auscultation bilaterally, normal work of breathing GI: soft, nontender, nondistended, + BS Ext: Compression stockings in place, no significant edema  Neuro:  Alert and Oriented x 3 Psych: euthymic mood, full affect  Wt Readings from Last 3 Encounters:  11/24/16 191 lb 1.9 oz (86.7 kg)  09/08/16 187 lb 3.2 oz (84.9 kg)  07/16/16 186 lb (84.4 kg)      Studies/Labs Reviewed:   EKG:  EKG is not  ordered today.    Recent Labs: 06/22/2016: ALT 16 06/24/2016: Magnesium 2.1 06/26/2016: B Natriuretic Peptide 618.7 07/03/2016: NT-Pro BNP 4,153 10/18/2016: BUN 35; Creatinine, Ser 1.92; Hemoglobin 10.6; Platelets 135; Potassium 3.8; Sodium 139   Lipid Panel No results found for: CHOL, TRIG, HDL, CHOLHDL, VLDL, LDLCALC, LDLDIRECT  Additional studies/ records that were reviewed today include:  Echo in 3/18 showed:   - Left ventricle: The cavity size was normal. There was moderate   concentric hypertrophy. Systolic function was normal. Wall motion   was normal; there were no regional wall motion abnormalities.   Doppler parameters are consistent with a reversible restrictive   pattern, indicative of decreased left ventricular diastolic   compliance and/or increased left atrial pressure (grade 3   diastolic dysfunction). Doppler parameters are consistent with   high ventricular filling pressure. - Aortic valve: Transvalvular velocity was within the normal range.   There was no  stenosis. Valve area (VTI): 1.56 cm^2. Valve area   (Vmax): 1.53 cm^2. Valve area (Vmean): 1.56 cm^2. - Mitral valve: Transvalvular velocity was within the normal range.   There was no evidence for stenosis. There was mild regurgitation. - Left atrium: The atrium was mildly dilated. - Right ventricle: The cavity size was normal. Wall thickness was   normal. Systolic function was normal. - Tricuspid valve: There was trivial regurgitation. - Pulmonary arteries: Systolic pressure was within the normal   range. PA peak pressure: 34 mm Hg (S).         ASSESSMENT:    1. Acute on chronic diastolic CHF (congestive heart failure) (HCC)  2. Essential hypertension   3. CKD (chronic kidney disease), stage IV (HCC)   4. Mixed hyperlipidemia      PLAN:  In order of problems listed above:  Acute on chronic diastolic CHF with 4 pound weight gain on our scales. Will increase Lasix to 40 mg twice a day for 3 days and potassium 20 mEq twice a day for 3 days then back to once a day for both. With the patient daily. 2 g sodium diet reiterated. Have home health draw bmet on Friday. Recommend Claritin and Flonase for upper respiratory congestion and if that doesn't improve see primary care.  Essential hypertension-year-old on losartan and metoprolol and Imdur  CK D creatinine in the hospital 10/18/16 1.92. We'll recheck later this week  Mixed hyperlipidemia on Lipitor followed by primary care    Medication Adjustments/Labs and Tests Ordered: Current medicines are reviewed at length with the patient today.  Concerns regarding medicines are outlined above.  Medication changes, Labs and Tests ordered today are listed in the Patient Instructions below. There are no Patient Instructions on file for this visit.   Elson Clan, PA-C  11/24/2016 10:37 AM    [facility] [address] [place], [place], [place]  [address] Phone: [phone]; Fax: [phone]

## 2016-11-24 NOTE — Patient Instructions (Signed)
Medication Instructions:  Your physician has recommended you make the following change in your medication:   1. Take lasix 40 mg twice a day for 3 days, then go back to taking 40 mg once daily.  2. Take potassium 20 mEq twice daily for 3 days, then go back to taking 20 mEq once daily.   Labwork: Your physician recommends that you have lab work drawn from home health on Friday: BMET   Testing/Procedures: None ordered  Follow-Up: Keep regularly scheduled follow up with Dr. Eldridge Dace.   Any Other Special Instructions Will Be Listed Below (If Applicable).     If you need a refill on your cardiac medications before your next appointment, please call your pharmacy.

## 2017-02-03 ENCOUNTER — Encounter (HOSPITAL_COMMUNITY): Payer: Self-pay

## 2017-02-03 ENCOUNTER — Inpatient Hospital Stay (HOSPITAL_COMMUNITY): Payer: Medicare PPO

## 2017-02-03 ENCOUNTER — Inpatient Hospital Stay (HOSPITAL_COMMUNITY)
Admission: EM | Admit: 2017-02-03 | Discharge: 2017-02-06 | DRG: 291 | Disposition: A | Discharge status: Home Health | Payer: Medicare PPO | Attending: Student in an Organized Health Care Education/Training Program | Admitting: Student in an Organized Health Care Education/Training Program

## 2017-02-03 ENCOUNTER — Emergency Department (HOSPITAL_COMMUNITY): Payer: Medicare PPO

## 2017-02-03 DIAGNOSIS — E785 Hyperlipidemia, unspecified: Secondary | ICD-10-CM | POA: Diagnosis present

## 2017-02-03 DIAGNOSIS — R0602 Shortness of breath: Secondary | ICD-10-CM | POA: Diagnosis not present

## 2017-02-03 DIAGNOSIS — I340 Nonrheumatic mitral (valve) insufficiency: Secondary | ICD-10-CM | POA: Diagnosis present

## 2017-02-03 DIAGNOSIS — I509 Heart failure, unspecified: Secondary | ICD-10-CM

## 2017-02-03 DIAGNOSIS — I059 Rheumatic mitral valve disease, unspecified: Secondary | ICD-10-CM | POA: Diagnosis not present

## 2017-02-03 DIAGNOSIS — I272 Pulmonary hypertension, unspecified: Secondary | ICD-10-CM | POA: Diagnosis present

## 2017-02-03 DIAGNOSIS — E876 Hypokalemia: Secondary | ICD-10-CM | POA: Diagnosis present

## 2017-02-03 DIAGNOSIS — R7989 Other specified abnormal findings of blood chemistry: Secondary | ICD-10-CM | POA: Diagnosis present

## 2017-02-03 DIAGNOSIS — R0600 Dyspnea, unspecified: Secondary | ICD-10-CM

## 2017-02-03 DIAGNOSIS — I130 Hypertensive heart and chronic kidney disease with heart failure and stage 1 through stage 4 chronic kidney disease, or unspecified chronic kidney disease: Principal | ICD-10-CM | POA: Diagnosis present

## 2017-02-03 DIAGNOSIS — I5023 Acute on chronic systolic (congestive) heart failure: Secondary | ICD-10-CM | POA: Diagnosis not present

## 2017-02-03 DIAGNOSIS — I5033 Acute on chronic diastolic (congestive) heart failure: Secondary | ICD-10-CM | POA: Diagnosis present

## 2017-02-03 DIAGNOSIS — Z6831 Body mass index (BMI) 31.0-31.9, adult: Secondary | ICD-10-CM | POA: Diagnosis not present

## 2017-02-03 DIAGNOSIS — I248 Other forms of acute ischemic heart disease: Secondary | ICD-10-CM | POA: Diagnosis present

## 2017-02-03 DIAGNOSIS — N183 Chronic kidney disease, stage 3 unspecified: Secondary | ICD-10-CM

## 2017-02-03 DIAGNOSIS — E669 Obesity, unspecified: Secondary | ICD-10-CM | POA: Diagnosis present

## 2017-02-03 DIAGNOSIS — E1122 Type 2 diabetes mellitus with diabetic chronic kidney disease: Secondary | ICD-10-CM | POA: Diagnosis present

## 2017-02-03 DIAGNOSIS — I440 Atrioventricular block, first degree: Secondary | ICD-10-CM | POA: Diagnosis present

## 2017-02-03 DIAGNOSIS — N184 Chronic kidney disease, stage 4 (severe): Secondary | ICD-10-CM | POA: Diagnosis present

## 2017-02-03 DIAGNOSIS — Z79899 Other long term (current) drug therapy: Secondary | ICD-10-CM | POA: Diagnosis not present

## 2017-02-03 DIAGNOSIS — R778 Other specified abnormalities of plasma proteins: Secondary | ICD-10-CM | POA: Diagnosis present

## 2017-02-03 DIAGNOSIS — I5021 Acute systolic (congestive) heart failure: Secondary | ICD-10-CM | POA: Diagnosis not present

## 2017-02-03 DIAGNOSIS — Z794 Long term (current) use of insulin: Secondary | ICD-10-CM | POA: Diagnosis not present

## 2017-02-03 DIAGNOSIS — I10 Essential (primary) hypertension: Secondary | ICD-10-CM | POA: Diagnosis not present

## 2017-02-03 DIAGNOSIS — Z7982 Long term (current) use of aspirin: Secondary | ICD-10-CM | POA: Diagnosis not present

## 2017-02-03 DIAGNOSIS — N179 Acute kidney failure, unspecified: Secondary | ICD-10-CM | POA: Diagnosis present

## 2017-02-03 DIAGNOSIS — R748 Abnormal levels of other serum enzymes: Secondary | ICD-10-CM | POA: Diagnosis not present

## 2017-02-03 DIAGNOSIS — Z515 Encounter for palliative care: Secondary | ICD-10-CM

## 2017-02-03 DIAGNOSIS — N189 Chronic kidney disease, unspecified: Secondary | ICD-10-CM | POA: Diagnosis not present

## 2017-02-03 DIAGNOSIS — Z7189 Other specified counseling: Secondary | ICD-10-CM | POA: Diagnosis not present

## 2017-02-03 DIAGNOSIS — F0390 Unspecified dementia without behavioral disturbance: Secondary | ICD-10-CM | POA: Diagnosis present

## 2017-02-03 DIAGNOSIS — E875 Hyperkalemia: Secondary | ICD-10-CM | POA: Diagnosis present

## 2017-02-03 DIAGNOSIS — I5020 Unspecified systolic (congestive) heart failure: Secondary | ICD-10-CM | POA: Diagnosis present

## 2017-02-03 DIAGNOSIS — R34 Anuria and oliguria: Secondary | ICD-10-CM | POA: Diagnosis not present

## 2017-02-03 HISTORY — DX: Unspecified dementia, unspecified severity, without behavioral disturbance, psychotic disturbance, mood disturbance, and anxiety: F03.90

## 2017-02-03 LAB — BASIC METABOLIC PANEL
ANION GAP: 12 (ref 5–15)
BUN: 59 mg/dL — AB (ref 6–20)
CALCIUM: 9.2 mg/dL (ref 8.9–10.3)
CO2: 21 mmol/L — AB (ref 22–32)
Chloride: 107 mmol/L (ref 101–111)
Creatinine, Ser: 2.64 mg/dL — ABNORMAL HIGH (ref 0.44–1.00)
GFR calc Af Amer: 17 mL/min — ABNORMAL LOW (ref >60)
GFR, EST NON AFRICAN AMERICAN: 15 mL/min — AB (ref >60)
Glucose, Bld: 284 mg/dL — ABNORMAL HIGH (ref 65–99)
POTASSIUM: 5.2 mmol/L — AB (ref 3.5–5.1)
Sodium: 140 mmol/L (ref 135–145)

## 2017-02-03 LAB — CBC
HEMATOCRIT: 33.7 % — AB (ref 36.0–46.0)
HEMOGLOBIN: 10.3 g/dL — AB (ref 12.0–15.0)
MCH: 31.1 pg (ref 26.0–34.0)
MCHC: 30.6 g/dL (ref 30.0–36.0)
MCV: 101.8 fL — ABNORMAL HIGH (ref 78.0–100.0)
Platelets: 123 10*3/uL — ABNORMAL LOW (ref 150–400)
RBC: 3.31 MIL/uL — ABNORMAL LOW (ref 3.87–5.11)
RDW: 15.9 % — AB (ref 11.5–15.5)
WBC: 6 10*3/uL (ref 4.0–10.5)

## 2017-02-03 LAB — GLUCOSE, CAPILLARY
GLUCOSE-CAPILLARY: 208 mg/dL — AB (ref 65–99)
Glucose-Capillary: 171 mg/dL — ABNORMAL HIGH (ref 65–99)

## 2017-02-03 LAB — URINALYSIS, ROUTINE W REFLEX MICROSCOPIC
Bilirubin Urine: NEGATIVE
GLUCOSE, UA: NEGATIVE mg/dL
Hgb urine dipstick: NEGATIVE
Ketones, ur: NEGATIVE mg/dL
NITRITE: NEGATIVE
PH: 5 (ref 5.0–8.0)
Protein, ur: NEGATIVE mg/dL
SPECIFIC GRAVITY, URINE: 1.012 (ref 1.005–1.030)

## 2017-02-03 LAB — HEPATIC FUNCTION PANEL
ALBUMIN: 3.3 g/dL — AB (ref 3.5–5.0)
ALK PHOS: 46 U/L (ref 38–126)
ALT: 22 U/L (ref 14–54)
AST: 42 U/L — AB (ref 15–41)
Bilirubin, Direct: 0.3 mg/dL (ref 0.1–0.5)
Indirect Bilirubin: 1 mg/dL — ABNORMAL HIGH (ref 0.3–0.9)
TOTAL PROTEIN: 6.6 g/dL (ref 6.5–8.1)
Total Bilirubin: 1.3 mg/dL — ABNORMAL HIGH (ref 0.3–1.2)

## 2017-02-03 LAB — I-STAT CG4 LACTIC ACID, ED
LACTIC ACID, VENOUS: 3.99 mmol/L — AB (ref 0.5–1.9)
Lactic Acid, Venous: 2.64 mmol/L (ref 0.5–1.9)

## 2017-02-03 LAB — ECHOCARDIOGRAM COMPLETE
Ao-asc: 30 cm
CHL CUP MV DEC (S): 162
CHL CUP RV SYS PRESS: 76 mmHg
EWDT: 162 ms
FS: 12 % — AB (ref 28–44)
IVS/LV PW RATIO, ED: 0.86
LA ID, A-P, ES: 43 mm
LADIAMINDEX: 2.15 cm/m2
LAVOL: 73.9 mL
LAVOLA4C: 58.5 mL
LAVOLIN: 37 mL/m2
LDCA: 3.14 cm2
LEFT ATRIUM END SYS DIAM: 43 mm
LV PW d: 13.8 mm — AB (ref 0.6–1.1)
LVOTD: 20 mm
MV Peak grad: 9 mmHg
MV VTI: 159 cm
MV pk E vel: 152 m/s
MVPKAVEL: 90.6 m/s
PISA EROA: 0.19 cm2
RV TAPSE: 21.9 mm
Reg peak vel: 392 cm/s
TR max vel: 392 cm/s
Weight: 2955.2 oz

## 2017-02-03 LAB — I-STAT VENOUS BLOOD GAS, ED
ACID-BASE DEFICIT: 6 mmol/L — AB (ref 0.0–2.0)
Bicarbonate: 20.8 mmol/L (ref 20.0–28.0)
O2 Saturation: 26 %
PH VEN: 7.285 (ref 7.250–7.430)
TCO2: 22 mmol/L (ref 22–32)
pCO2, Ven: 43.8 mmHg — ABNORMAL LOW (ref 44.0–60.0)
pO2, Ven: 20 mmHg — CL (ref 32.0–45.0)

## 2017-02-03 LAB — BRAIN NATRIURETIC PEPTIDE: B Natriuretic Peptide: >4500 pg/mL — ABNORMAL HIGH (ref 0.0–100.0)

## 2017-02-03 LAB — TROPONIN I
Troponin I: 1.34 ng/mL (ref <0.03)
Troponin I: 2.79 ng/mL (ref <0.03)

## 2017-02-03 LAB — I-STAT TROPONIN, ED: Troponin i, poc: 0.81 ng/mL (ref 0.00–0.08)

## 2017-02-03 LAB — CBG MONITORING, ED: GLUCOSE-CAPILLARY: 229 mg/dL — AB (ref 65–99)

## 2017-02-03 MED ORDER — HEPARIN SODIUM (PORCINE) 5000 UNIT/ML IJ SOLN
5000.0000 [IU] | Freq: Three times a day (TID) | INTRAMUSCULAR | Status: DC
  Administered 2017-02-03 (×2): 5000 [IU] via SUBCUTANEOUS
  Filled 2017-02-03 (×2): qty 1

## 2017-02-03 MED ORDER — ACETAMINOPHEN 650 MG RE SUPP: 650.0000 mg | Freq: Four times a day (QID) | RECTAL | Status: DC | PRN

## 2017-02-03 MED ORDER — METOPROLOL TARTRATE 12.5 MG HALF TABLET
12.5000 mg | ORAL_TABLET | Freq: Two times a day (BID) | ORAL | Status: DC
  Administered 2017-02-03 – 2017-02-06 (×6): 12.5 mg via ORAL
  Filled 2017-02-03 (×7): qty 1

## 2017-02-03 MED ORDER — IPRATROPIUM-ALBUTEROL 0.5-2.5 (3) MG/3ML IN SOLN
3.0000 mL | Freq: Once | RESPIRATORY_TRACT | Status: AC
  Administered 2017-02-03: 3 mL via RESPIRATORY_TRACT
  Filled 2017-02-03: qty 3

## 2017-02-03 MED ORDER — FUROSEMIDE 10 MG/ML IJ SOLN
80.0000 mg | Freq: Once | INTRAMUSCULAR | Status: AC
  Administered 2017-02-03: 80 mg via INTRAVENOUS
  Filled 2017-02-03: qty 8

## 2017-02-03 MED ORDER — DONEPEZIL HCL 5 MG PO TABS
5.0000 mg | ORAL_TABLET | Freq: Every day | ORAL | Status: DC
Start: 2017-02-03 — End: 2017-02-06
  Administered 2017-02-04 – 2017-02-06 (×3): 5 mg via ORAL
  Filled 2017-02-03 (×3): qty 1

## 2017-02-03 MED ORDER — PERFLUTREN LIPID MICROSPHERE
INTRAVENOUS | Status: AC
  Administered 2017-02-03: 3 mL
  Filled 2017-02-03: qty 10

## 2017-02-03 MED ORDER — INSULIN GLARGINE 100 UNIT/ML ~~LOC~~ SOLN
12.0000 [IU] | Freq: Every day | SUBCUTANEOUS | Status: DC
  Administered 2017-02-03 – 2017-02-05 (×3): 12 [IU] via SUBCUTANEOUS
  Filled 2017-02-03 (×3): qty 0.12

## 2017-02-03 MED ORDER — ATORVASTATIN CALCIUM 40 MG PO TABS
40.0000 mg | ORAL_TABLET | Freq: Every day | ORAL | Status: DC
Start: 2017-02-03 — End: 2017-02-06
  Administered 2017-02-04 – 2017-02-06 (×3): 40 mg via ORAL
  Filled 2017-02-03 (×3): qty 1

## 2017-02-03 MED ORDER — INSULIN ASPART 100 UNIT/ML ~~LOC~~ SOLN
0.0000 [IU] | Freq: Three times a day (TID) | SUBCUTANEOUS | Status: DC
  Administered 2017-02-03 (×2): 3 [IU] via SUBCUTANEOUS
  Administered 2017-02-04 (×2): 1 [IU] via SUBCUTANEOUS
  Administered 2017-02-05: 2 [IU] via SUBCUTANEOUS
  Filled 2017-02-03: qty 1

## 2017-02-03 MED ORDER — ACETAMINOPHEN 325 MG PO TABS
650.0000 mg | ORAL_TABLET | Freq: Four times a day (QID) | ORAL | Status: DC | PRN
  Filled 2017-02-03: qty 2

## 2017-02-03 MED ORDER — FUROSEMIDE 10 MG/ML IJ SOLN
80.0000 mg | Freq: Two times a day (BID) | INTRAMUSCULAR | Status: DC
  Administered 2017-02-03 – 2017-02-04 (×3): 80 mg via INTRAVENOUS
  Filled 2017-02-03 (×3): qty 8

## 2017-02-03 MED ORDER — SODIUM CHLORIDE 0.9% FLUSH
3.0000 mL | Freq: Two times a day (BID) | INTRAVENOUS | Status: DC
  Administered 2017-02-03 – 2017-02-05 (×4): 3 mL via INTRAVENOUS

## 2017-02-03 MED ORDER — FUROSEMIDE 10 MG/ML IJ SOLN: 40.0000 mg | Freq: Two times a day (BID) | INTRAMUSCULAR | Status: DC

## 2017-02-03 MED ORDER — ISOSORBIDE MONONITRATE ER 30 MG PO TB24
30.0000 mg | ORAL_TABLET | Freq: Every day | ORAL | Status: DC
  Administered 2017-02-03 – 2017-02-06 (×4): 30 mg via ORAL
  Filled 2017-02-03 (×4): qty 1

## 2017-02-03 MED ORDER — PERFLUTREN LIPID MICROSPHERE
1.0000 mL | INTRAVENOUS | Status: AC | PRN
  Filled 2017-02-03: qty 10

## 2017-02-03 MED ORDER — ESCITALOPRAM OXALATE 10 MG PO TABS
10.0000 mg | ORAL_TABLET | Freq: Every day | ORAL | Status: DC
  Administered 2017-02-03 – 2017-02-06 (×4): 10 mg via ORAL
  Filled 2017-02-03 (×4): qty 1

## 2017-02-03 MED ORDER — ASPIRIN EC 81 MG PO TBEC
81.0000 mg | DELAYED_RELEASE_TABLET | Freq: Every day | ORAL | Status: DC
  Administered 2017-02-03 – 2017-02-06 (×4): 81 mg via ORAL
  Filled 2017-02-03 (×4): qty 1

## 2017-02-03 NOTE — Progress Notes (Signed)
  Echocardiogram 2D Echocardiogram has been performed.  Bethany Nelson G Bethany Nelson 02/03/2017, 5:06 PM

## 2017-02-03 NOTE — H&P (Signed)
Date: 02/03/2017               Patient Name:  Bethany Nelson MRN: [id number]  DOB: 07/18/1926 Age / Sex: 83 y.o., female   PCP: Jerl MinaHedrick, James, MD         Medical Service: Internal Medicine Teaching Service         Attending Physician: Dr. Oswaldo DoneVincent, Marquita Palmsuncan Thomas, *    First Contact: Dr. Caron PresumeHelberg Pager: [phone]  Second Contact: Dr. Antony ContrasGuilloud Pager: [phone]       After Hours (After 5p/  First Contact Pager: [phone]  weekends / holidays): Second Contact Pager: [phone]   Chief Complaint: Dyspnea  History of Present Illness:  Bethany Nelson is an 83 year old female with PMH of HFpEF (EF 60-65%), HTN, CKD Stage IV, T2DM, and HLD who presents for persistent dyspnea. She is accompanied by her daughter who provides additional history.  Patient began to have URI symptoms about 1 month ago with cough initially productive of yellow sputum and congestion and generally feeling unwell. She was started on a Z-pak when seen by her Endocrinologist. She saw her PCP 4 days later and switched to Levaquin as she had no improvement. She has had continued dyspnea with orthopnea and PND, worsened in the last few days, which prompted her to arrive to the ED.  She has not noticed any increased swelling in her feet or legs from baseline. She is unsure if her weight has changed at home. Her baseline weight is 183 lbs per daughter. She has a continued cough which is occasionally productive of clear sputum. She has had associated nausea without emesis and diaphoresis. She denies any chest pain, palpitations, hemoptysis, fevers, or abdominal pain. She reports adherence to Lasix 40 mg daily and dietary/fluid restrictions.  In the ED, initial vitals were: BP 136/74   Pulse 80   Temp 97.6 F (36.4 C) (Oral)   Resp 19   Wt 83.8 kg (184 lb 11.2 oz)   SpO2 95%  She was noted to be tachypneic without hypoxia. Lab work was notable for an I-stat Trop of 0.81, lactic acid 3.99, WBC 6.0, Hgb 10.3, K 5.2, Cr 2.64  (b/l ~ 1.9), and BNP >4500 (last BNP was 618).  She was given IV Lasix 80 mg once and a duoneb treatment with some improvement. IMTS were called to admit.   Meds:  Current Meds  Medication Sig  . albuterol (PROVENTIL HFA) 108 (90 Base) MCG/ACT inhaler Inhale 1 puff into the lungs every 4 (four) hours as needed for wheezing.  Marland Kitchen. aspirin EC 81 MG tablet Take 81 mg by mouth every other day.  Marland Kitchen. atorvastatin (LIPITOR) 40 MG tablet Take 40 mg by mouth daily.  . cetirizine (ZYRTEC) 10 MG tablet Take 10 mg by mouth daily as needed for allergies.  . cromolyn (OPTICROM) 4 % ophthalmic solution Place 1 drop into both eyes 4 (four) times daily.  Marland Kitchen. donepezil (ARICEPT) 5 MG tablet Take 5 mg by mouth daily.  . dorzolamide-timolol (COSOPT) 22.3-6.8 MG/ML ophthalmic solution Place 1 drop into both eyes 2 (two) times daily.  Marland Kitchen. escitalopram (LEXAPRO) 10 MG tablet Take 10 mg by mouth daily.   . fluticasone (FLONASE) 50 MCG/ACT nasal spray Place 1 spray into both nostrils daily as needed for allergies.   . furosemide (LASIX) 40 MG tablet Take 1 tablet (40 mg total) by mouth 2 (two) times daily as needed. (Patient taking differently: Take 40 mg by mouth daily. If  pt weight is 3 lbs difference from day before then pt takes 80mg )  . ipratropium-albuterol (DUONEB) 0.5-2.5 (3) MG/3ML SOLN Take 3 mLs by nebulization every 4 (four) hours as needed for wheezing.  . isosorbide mononitrate (IMDUR) 30 MG 24 hr tablet Take 1 tablet (30 mg total) by mouth daily.  Marland Kitchen LANTUS SOLOSTAR 100 UNIT/ML Solostar Pen Inject 16 Units into the skin every morning.   . latanoprost (XALATAN) 0.005 % ophthalmic solution Place 1 drop into both eyes at bedtime.  Marland Kitchen losartan (COZAAR) 100 MG tablet Take 100 mg by mouth daily.  . metoprolol tartrate (LOPRESSOR) 25 MG tablet Take 12.5 mg by mouth 2 (two) times daily.   . pioglitazone (ACTOS) 15 MG tablet Take 15 mg by mouth daily.   . Potassium Chloride ER 20 MEQ TBCR Take 20 mEq by mouth daily. TAKE  EXTRA TABLET WITH FLUID TABLET  IF GAIN  3 LBS IN 24 HOURS 5 LBS  IN A WEEK     Allergies: Allergies as of 02/03/2017  . (No Known Allergies)   Past Medical History:  Diagnosis Date  . Chronic diastolic congestive heart failure (HCC)    a. 02/2016: echo showing normal EF, Grade 3 DD, mild MR, trivial TR  . Dementia   . Diabetes mellitus without complication (HCC)   . Hypertension     Family History:  Family History  Problem Relation Age of Onset  . Esophageal cancer Mother   . Heart disease Neg Hx      Social History:  Social History   Socioeconomic History  . Marital status: Widowed    Spouse name: None  . Number of children: None  . Years of education: None  . Highest education level: None  Social Needs  . Financial resource strain: None  . Food insecurity - worry: None  . Food insecurity - inability: None  . Transportation needs - medical: None  . Transportation needs - non-medical: None  Occupational History  . None  Tobacco Use  . Smoking status: Never Smoker  . Smokeless tobacco: Never Used  Substance and Sexual Activity  . Alcohol use: No  . Drug use: No  . Sexual activity: None  Other Topics Concern  . None  Social History Narrative  . None     Review of Systems: A complete ROS was negative except as per HPI.   Physical Exam: Blood pressure 115/60, pulse 69, temperature 97.6 F (36.4 C), temperature source Oral, resp. rate 19, weight 184 lb 11.2 oz (83.8 kg), SpO2 96 %. Physical Exam  Constitutional: She is oriented to person, place, and time. She appears well-developed and well-nourished.  Appears uncomfortable  Eyes: EOM are normal.  Neck: Neck supple.  Cardiovascular: Normal rate and regular rhythm.  No murmur heard. Pulmonary/Chest: Effort normal. No respiratory distress.  Bibasilar inspiratory crackles  Abdominal: Soft. She exhibits no distension. There is no tenderness.  Musculoskeletal:  Trace LE edema bilaterally  Neurological:  She is alert and oriented to person, place, and time.  Skin: Skin is warm.  Psychiatric: She has a normal mood and affect.     EKG: personally reviewed my interpretation is sinus rhythm, 1st degree AV block, rate 72 bpm, mild ST depression V4-V6 new compared to prior  CXR: personally reviewed my interpretation is cardiac borders and diaphragmatic angles obscured suggestive of pleural effusion, increased vascular markings without kerley B lines. Aortic atherosclerosis present.  Assessment & Plan by Problem: Principal Problem:   Acute on chronic diastolic CHF (  congestive heart failure) (HCC) Active Problems:   Hypertension   Type 2 diabetes mellitus with stage 3 chronic kidney disease, with long-term current use of insulin (HCC)   CKD (chronic kidney disease), stage IV (HCC)   Elevated troponin  83 year old female with PMH of HFpEF (EF 60-65%), HTN, CKD Stage IV, T2DM, and HLD who presents for acute on chronic diastolic CHF exacerbation with elevated Troponins.  Acute on Chronic HFpEF Exacerbation with Elevated Troponin: Patient with progressive dyspnea on exertion, orthopnea, and PND which has worsened in the last three days. Symptoms and BNP > 4500 are suggestive of acute HFpEF exacerbation, however she does have an elevated troponin of 1.34 concerning for NSTEMI although this may be demand ischemia. She denies any active chest pain, however she may be having silent ischemia in the setting of diabetes which brought on her heart failure exacerbation. Of note, she has had elevated troponins on prior admission for CHF exacerbation up to 2.29. Will diurese, trend troponins, obtain TTE, and consult cardiology. - IV Lasix 80 mg BID - Trend Troponins - TTE - ASA 81 mg daily - Metoprolol 12.5 mg BID - Atorvastatin 40 mg daily - Imdur 30 mg daily - Monitor BMET - Strict I/Os, Daily weights - Cardiology consulted, appreciate recommendations   Hyperkalemia: K 5.2 on admission, mildly  elevated without EKG changes. May be secondary to potassium supplementation in setting of AKI. Will likely come down with diuresis. - Hold K supplement, and monitor BMET   Acute on Chronic Kidney Injury: Patient with CKD Stage 4 with elevated creatinine above baseline at 2.64. Suspect this is secondary to volume overload. Will continue to monitor with diuresis. - Continue IV lasix as above, daily BMETs  T2DM: Last A1c 6.8 on 12/25/16. On Lantus 16 units daily and Pioglitazone 15 mg daily at home. - Lantus 12 units daily - SSI-S  HTN: BPs stable on admission. Monitor with diuresis. - Metoprolol 12.5 mg BID - Hold home losartan in setting of AKI  Code Status: FULL CODE per conversation with patient and daughter   Dispo: Admit patient to Inpatient with expected length of stay greater than 2 midnights.  Signed: Darreld McleanPatel, Maggie Senseney, MD 02/03/2017, 3:47 PM

## 2017-02-03 NOTE — ED Notes (Signed)
Lunch tray ordered 

## 2017-02-03 NOTE — ED Provider Notes (Signed)
[facility] Provider Note   CSN: [id number] Arrival date & time: 02/03/17  0740     History   Chief Complaint Chief Complaint  Patient presents with  . Shortness of Breath    HPI Bethany Nelson is a 83 y.o. female with history of hypertension, diabetes on insulin, kidney disease, chronic diastolic CHF presents to ED for evaluation of labored breathing, shortness of breath, persistent wet sounding cough with clear sputum, nausea and vomiting x one night. Daughter at bedside states 1 month ago patient began having a cough, she had a chest x-ray which was normal but PCP put her on a Zithromax in which she finished however cough was not improved so PCP changed her to Levaquin. Finished Levaquin one week ago. Has tried albuterol inhaler without relief of shortness of breath. She takes 40 mg of Lasix daily, compliant. Daughter states adherence to fluid and salt restriction.  No fevers, chills, chest pain, dizziness, orthopnea, increased lower extremity edema or weight. Baseline weight 183# per daughter. No abdominal pain, diarrhea, constipatioin, melena, hematochezia, dysuria.  HPI  Past Medical History:  Diagnosis Date  . Chronic diastolic congestive heart failure (HCC)    a. 02/2016: echo showing normal EF, Grade 3 DD, mild MR, trivial TR  . Diabetes mellitus without complication (HCC)   . Hypertension     Patient Active Problem List   Diagnosis Date Noted  . Arthritis 07/16/2016  . Depression 07/16/2016  . Thyroid disease 07/16/2016  . Elevated troponin 06/22/2016  . Acute respiratory failure with hypoxia (HCC) 06/22/2016  . 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 Chronic diastolic heart failure (HCC) 05/12/2016  . Hyperlipidemia, unspecified 05/12/2016  . Acute on chronic diastolic CHF (congestive heart failure) (HCC)  02/21/2016  . Hypertension 02/21/2016  . Type 2 diabetes mellitus with stage 3 chronic kidney disease, with long-term current use of insulin (HCC) 02/21/2016  . CKD (chronic kidney disease), stage IV (HCC) 02/21/2016  . New onset of congestive heart failure (HCC) 02/21/2016    History reviewed. No pertinent surgical history.  OB History    No data available       Home Medications    Prior to Admission medications   Medication Sig Start Date End Date Taking? Authorizing Provider  aspirin EC 81 MG tablet Take 81 mg by mouth every other day.    [provider]  atorvastatin (LIPITOR) 40 MG tablet Take 40 mg by mouth daily. 01/08/16   [provider]  cetirizine (ZYRTEC) 10 MG tablet Take 10 mg by mouth daily as needed for allergies.    [provider]  donepezil (ARICEPT) 5 MG tablet Take 5 mg by mouth daily. 05/13/16   [provider]  dorzolamide-timolol (COSOPT) 22.3-6.8 MG/ML ophthalmic solution Place 1 drop into both eyes 2 (two) times daily. 12/16/15   [provider]  escitalopram (LEXAPRO) 10 MG tablet Take 10 mg by mouth daily.     [provider]  fluticasone (FLONASE) 50 MCG/ACT nasal spray Place 1 spray into both nostrils daily as needed for allergies.  02/17/16   [provider]  furosemide (LASIX) 40 MG tablet Take 1 tablet (40 mg total) by mouth 2 (two) times daily as needed. Patient taking differently: Take 40 mg by mouth daily. If pt weight is 3 lbs difference from day before then pt takes 80mg  09/08/16  Corky Crafts, MD  isosorbide mononitrate (IMDUR) 30 MG 24 hr tablet Take 1 tablet (30 mg total) by mouth daily. 06/26/16   Leroy Sea, MD  LANTUS SOLOSTAR 100 UNIT/ML Solostar Pen Inject 18 Units into the skin every morning.  01/09/16   [provider]  latanoprost (XALATAN) 0.005 % ophthalmic solution Place 1 drop into both eyes at bedtime. 02/15/16   [provider]  losartan  (COZAAR) 100 MG tablet Take 100 mg by mouth daily. 10/13/16   [provider]  metoprolol tartrate (LOPRESSOR) 25 MG tablet Take 12.5 mg by mouth 2 (two) times daily.  01/09/16   [provider]  pioglitazone (ACTOS) 15 MG tablet Take 15 mg by mouth daily.  07/21/16   [provider]  Potassium Chloride ER 20 MEQ TBCR Take 20 mEq by mouth daily. TAKE EXTRA TABLET WITH FLUID TABLET  IF GAIN  3 LBS IN 24 HOURS 5 LBS  IN A WEEK 07/16/16   Dyann Kief, PA-C    Family History Family History  Problem Relation Age of Onset  . Esophageal cancer Mother   . Heart disease Neg Hx     Social History Social History   Tobacco Use  . Smoking status: Never Smoker  . Smokeless tobacco: Never Used  Substance Use Topics  . Alcohol use: No  . Drug use: No     Allergies   Patient has no known allergies.   Review of Systems Review of Systems  Respiratory: Positive for cough and shortness of breath.   Gastrointestinal: Positive for nausea and vomiting.  All other systems reviewed and are negative.    Physical Exam Updated Vital Signs BP 136/74   Pulse 80   Temp 97.6 F (36.4 C) (Oral)   Resp 19   Wt 83.8 kg (184 lb 11.2 oz)   SpO2 95%   BMI 29.81 kg/m   Physical Exam  Constitutional: She appears well-developed and well-nourished.  Non toxic  HENT:  Head: Normocephalic and atraumatic.  Nose: Nose normal.  Moist mucous membranes Tonsils and oropharynx normal  Eyes: Conjunctivae, EOM and lids are normal.  Neck: Trachea normal and normal range of motion.  Neck is supple Trachea midline No cervical adenopathy  Cardiovascular: Normal rate, regular rhythm, S1 normal, S2 normal and normal heart sounds.  Pulses:      Carotid pulses are 2+ on the right side, and 2+ on the left side.      Radial pulses are 2+ on the right side, and 2+ on the left side.       Dorsalis pedis pulses are 2+ on the right side, and 2+ on the left side.  RRR No S3 No orthopnea 1+  bilateral LE edema, symmetric No carotid bruits  Pulmonary/Chest: Breath sounds normal. Accessory muscle usage present. Tachypnea noted. No respiratory distress. She has no decreased breath sounds. She has no wheezes. She has no rhonchi. She has no rales.  Belly breathing. Tachypnic. Normal SpO2.  No chest wall tenderness  Abdominal: Soft. Bowel sounds are normal. There is no tenderness.  No epigastric tenderness  Lymphadenopathy:    She has no cervical adenopathy.  Neurological: She is alert. GCS eye subscore is 4. GCS verbal subscore is 5. GCS motor subscore is 6.  Skin: Skin is warm and dry. Capillary refill takes less than 2 seconds.  Psychiatric: She has a normal mood and affect. Her speech is normal and behavior is normal. Judgment and thought content normal. Cognition  and memory are normal.     ED Treatments / Results  Labs (all labs ordered are listed, but only abnormal results are displayed) Labs Reviewed  BASIC METABOLIC PANEL - Abnormal; Notable for the following components:      Result Value   Potassium 5.2 (*)    CO2 21 (*)    Glucose, Bld 284 (*)    BUN 59 (*)    Creatinine, Ser 2.64 (*)    GFR calc non Af Amer 15 (*)    GFR calc Af Amer 17 (*)    All other components within normal limits  CBC - Abnormal; Notable for the following components:   RBC 3.31 (*)    Hemoglobin 10.3 (*)    HCT 33.7 (*)    MCV 101.8 (*)    RDW 15.9 (*)    Platelets 123 (*)    All other components within normal limits  I-STAT TROPONIN, ED - Abnormal; Notable for the following components:   Troponin i, poc 0.81 (*)    All other components within normal limits  I-STAT CG4 LACTIC ACID, ED - Abnormal; Notable for the following components:   Lactic Acid, Venous 3.99 (*)    All other components within normal limits  I-STAT VENOUS BLOOD GAS, ED - Abnormal; Notable for the following components:   pCO2, Ven 43.8 (*)    pO2, Ven 20.0 (*)    Acid-base deficit 6.0 (*)    All other components  within normal limits  CULTURE, BLOOD (ROUTINE X 2)  CULTURE, BLOOD (ROUTINE X 2)  BRAIN NATRIURETIC PEPTIDE  URINALYSIS, ROUTINE W REFLEX MICROSCOPIC  HEPATIC FUNCTION PANEL  I-STAT CG4 LACTIC ACID, ED    EKG  EKG Interpretation  Date/Time:  Tuesday February 03 2017 07:47:05 EST Ventricular Rate:  76 PR Interval:  218 QRS Duration: 126 QT Interval:  430 QTC Calculation: 483 R Axis:   -17 Text Interpretation:  Sinus rhythm with 1st degree A-V block Non-specific intra-ventricular conduction block Cannot rule out Anteroseptal infarct , age undetermined Abnormal ECG Confirmed by Vanetta MuldersZackowski, Scott [phone]([id number]) on 02/03/2017 8:35:04 AM       Radiology Dg Chest 2 View  Result Date: 02/03/2017 CLINICAL DATA:  CHF. EXAM: CHEST  2 VIEW COMPARISON:  06/22/2016. FINDINGS: Cardiomegaly with pulmonary vascular prominence bilateral interstitial prominence bilateral pleural effusions again noted. Similar findings noted on prior exam. Spine is consistent with CHF. IMPRESSION: Persistent CHF with pulmonary edema bilateral pleural effusions. No change prior exam. Electronically Signed   By: Maisie Fushomas  Register   On: 02/03/2017 08:43    Procedures Procedures (including critical care time)  Medications Ordered in ED Medications  ipratropium-albuterol (DUONEB) 0.5-2.5 (3) MG/3ML nebulizer solution 3 mL (3 mLs Nebulization Given 02/03/17 0918)  furosemide (LASIX) injection 80 mg (80 mg Intravenous Given 02/03/17 0919)     Initial Impression / Assessment and Plan / ED Course  I have reviewed the triage vital signs and the nursing notes.  Pertinent labs & imaging results that were available during my care of the patient were reviewed by me and considered in my medical decision making (see chart for details).  Clinical Course as of Feb 04 1031  Tue Feb 03, 2017  0808 Pt not in room yet   [CG]  0840 Troponin i, poc: (!!) 0.81 [CG]  0841 Lactic Acid, Venous: (!!) 3.99 [CG]  0841 Creatinine: (!) 2.64  [CG]  1026 Reevaluated patient, oxygen sats 98% at rest. With repositioning she drops to 90-92%. Will add oxygen,  states work of breathing unchanged.  [CG]    Clinical Course User Index [CG] Liberty Handy, PA-C   83 year old female presents with shortness of breath at rest, cough, nausea and vomiting. Reports ongoing cough for 1 month, has finished 2 rounds of antibiotics without relief. No fevers. No chest pain. Baseline weight around 183 pounds. Reports compliance with Lasix, fluid and salt restriction.   On exam, she is to tachypnic w/ normal oxygen saturations at rest occasionally dropping to 90-92% with exertion. She has decreased breath sounds in lower lobes bilaterally. Slight edema in lower extremities, symmetric. Abdomen is soft NTND.  Lab work remarkable for elevated creatinine, elevated lactic acid, elevated troponin. Suspect elevation due to demand. Chest x-ray shows pulmonary edema. EKG unchanged from previous.  Final Clinical Impressions(s) / ED Diagnoses   History and workup consistent with acute on chronic CHF. Less likely ACS or infectious etiology. No previous h/o DVT/PE. We'll give IV Lasix and DuoNeb. Pending BNP, urine, LFTs, cultures. Pt was admitted for similar in April 2018. Will request admission for telemetry. Patient, ED treatment and discharge plan was discussed with supervising physician who is agreeable with plan.   1030: reassessed pt. o2 drops to 90-92% with movement in bed, no improvement in WOB. Will give O2 [place].  Final diagnoses:  None    ED Discharge Orders    None        Jerrell Mylar 02/03/17 1032    Vanetta Mulders, MD 02/03/17 1044

## 2017-02-03 NOTE — Consult Note (Signed)
Cardiology Consult    Patient ID: Bethany Nelson MRN: [id number], DOB/AGE: 81/03/1926   Admit date: 02/03/2017 Date of Consult: 02/03/2017  Primary Physician: Jerl MinaHedrick, James, MD Primary Cardiologist: Eldridge DaceVaranasi  Requesting Provider: Oswaldo DoneVincent Reason for Consultation: CHF  Bethany Nelson is a 83 y.o. female who is being seen today for the evaluation of CHF at the request of Dr. Oswaldo DoneVincent.   Patient Profile    83 yo female with PMH of diastolic HF, HTN, CKD, DM and HL who presented with dyspnea and found to have elevated BNP and pleural effusions.   Past Medical History   Past Medical History:  Diagnosis Date  . Chronic diastolic congestive heart failure (HCC)    a. 02/2016: echo showing normal EF, Grade 3 DD, mild MR, trivial TR  . Diabetes mellitus without complication (HCC)   . Hypertension     History reviewed. No pertinent surgical history.   Allergies  No Known Allergies  History of Present Illness    Mrs. Bethany Nelson is an [age] female with diastolic HF, HTN, CKD, DM and HL. She had a echo back in 4/18 with normal systolic function and G2DD with high filling pressures. Was admitted during that time for CHF and diuresed. Her metoprolol was decreased to 12.5mg  BID for low heart rate. Presented back to the office 11/24/16 and noted her weight was up by 4 lbs. Was c/o of shortness of breath with light activity. She was instructed to take 40mg  lasix BID for 3 additional days then return to home dose of 40mg  daily.   Reports over the past month she has been struggling with a URI. Was on a Zpac without much improvement. Then was placed on Levaquin. Daughter reports the patient has generally just not felt well. Weights have been stable per her report around 182, but the patient has also not been eating well. Unable to sleep at night. Denies any chest pain, but reports her breathing became significantly worse overnight which prompted them to come to the ED. Reports being compliant with home  lasix dose.   In the ED her labs showed stable electrolytes with mild elevated K+ 5.2, Cr 2.64, Trop 0.81>>1.34. BNP >4500, lactic acid 3.99>>2.64. CXR showed bilateral pleural effusions. EKG showed SR with 1st degree AVB with nonspecific IVCD. She was given IV lasix in the ED, so UOP thus far.   Inpatient Medications    . aspirin EC  81 mg Oral Daily  . atorvastatin  40 mg Oral Daily  . donepezil  5 mg Oral Daily  . escitalopram  10 mg Oral Daily  . furosemide  80 mg Intravenous BID  . heparin  5,000 Units Subcutaneous Q8H  . insulin aspart  0-9 Units Subcutaneous TID WC  . insulin glargine  12 Units Subcutaneous QHS  . isosorbide mononitrate  30 mg Oral Daily  . metoprolol tartrate  12.5 mg Oral BID  . sodium chloride flush  3 mL Intravenous Q12H    Family History    Family History  Problem Relation Age of Onset  . Esophageal cancer Mother   . Heart disease Neg Hx     Social History    Social History   Socioeconomic History  . Marital status: Widowed    Spouse name: Not on file  . Number of children: Not on file  . Years of education: Not on file  . Highest education level: Not on file  Social Needs  . Financial resource strain: Not on file  .  Food insecurity - worry: Not on file  . Food insecurity - inability: Not on file  . Transportation needs - medical: Not on file  . Transportation needs - non-medical: Not on file  Occupational History  . Not on file  Tobacco Use  . Smoking status: Never Smoker  . Smokeless tobacco: Never Used  Substance and Sexual Activity  . Alcohol use: No  . Drug use: No  . Sexual activity: Not on file  Other Topics Concern  . Not on file  Social History Narrative  . Not on file     Review of Systems    See HPI  All other systems reviewed and are otherwise negative except as noted above.  Physical Exam    Blood pressure 117/61, pulse 73, temperature 97.6 F (36.4 C), temperature source Oral, resp. rate 20, weight 184 lb  11.2 oz (83.8 kg), SpO2 97 %.  General: ill appearing older WF, mildly tachypnea  Psych: Normal affect. Neuro: Alert and oriented X 3. Moves all extremities spontaneously. HEENT: Normal  Neck: Supple without bruits, + JVD. Lungs:  Resp regular and unlabored, Diminished in bilateral lower lobes. Heart: RRR no s3, s4, 2/6 systolic murmur. Abdomen: Soft, non-tender, non-distended, BS + x 4.  Extremities: No clubbing, cyanosis or edema. DP/PT/Radials 2+ and equal bilaterally.  Labs    Troponin [facility] of Care Test) Recent Labs    02/03/17 0802  TROPIPOC 0.81*   Recent Labs    02/03/17 1022  TROPONINI 1.34*   Lab Results  Component Value Date   WBC 6.0 02/03/2017   HGB 10.3 (L) 02/03/2017   HCT 33.7 (L) 02/03/2017   MCV 101.8 (H) 02/03/2017   PLT 123 (L) 02/03/2017    Recent Labs  Lab 02/03/17 0750 02/03/17 1016  NA 140  --   K 5.2*  --   CL 107  --   CO2 21*  --   BUN 59*  --   CREATININE 2.64*  --   CALCIUM 9.2  --   PROT  --  6.6  BILITOT  --  1.3*  ALKPHOS  --  46  ALT  --  22  AST  --  42*  GLUCOSE 284*  --    No results found for: CHOL, HDL, LDLCALC, TRIG No results found for: [facility]   Radiology Studies    Dg Chest 2 View  Result Date: 02/03/2017 CLINICAL DATA:  CHF. EXAM: CHEST  2 VIEW COMPARISON:  06/22/2016. FINDINGS: Cardiomegaly with pulmonary vascular prominence bilateral interstitial prominence bilateral pleural effusions again noted. Similar findings noted on prior exam. Spine is consistent with CHF. IMPRESSION: Persistent CHF with pulmonary edema bilateral pleural effusions. No change prior exam. Electronically Signed   By: Maisie Fus  Register   On: 02/03/2017 08:43    ECG & Cardiac Imaging    EKG: SR with 1st degree AV block   Echo: 06/23/16  Study Conclusions  - Left ventricle: The cavity size was normal. Wall thickness was   increased in a pattern of moderate LVH. Systolic function was   normal. The estimated ejection fraction was in the  range of 60%   to 65%. Wall motion was normal; there were no regional wall   motion abnormalities. Features are consistent with a pseudonormal   left ventricular filling pattern, with concomitant abnormal   relaxation and increased filling pressure (grade 2 diastolic   dysfunction). - Aortic valve: Mildly calcified annulus. Moderately thickened,   moderately calcified leaflets. - Mitral valve: Mildly  calcified annulus. Moderately thickened,   moderately calcified leaflets . The findings are consistent with   mild to moderate stenosis. There was moderate regurgitation.   Valve area by pressure half-time: 1.34 cm^2. - Left atrium: The atrium was moderately dilated.  Assessment & Plan    83 yo female with PMH of diastolic HF, HTN, CKD, DM and HL who presented with dyspnea and found to have elevated BNP and pleural effusions.   1. Acute on chronic diastolic HF: Presented with worsening shortness of breath over the past several days. Weight per daughter have been stable, but patient has not been eating and drinking well. BNP >4500, CXR with bilateral pleural effusions. Reports being compliant with home lasix.  -- echo -- IV lasix -- strict I&Os, and daily weights -- suspect her weight is inaccurate and has been losing muscle mass, replaced with volume  2. Elevated Troponin: Would continue to trend as she denies any chest pain. Could be 2/2 to demand ischemia in the setting of acute HF. Check echo as above for decline in EF or WMA.   3. AKI on CKD: Cr 2.64, will need to follow closely with diuresis.   4. HTN: stable  5. HL: on statin   6. DM: SSI while inpatient  7. Hyperkalemia: given IV lasix -- follow BMET  Signed, Laverda PageLindsay Roberts, NP-C Pager [phone] 02/03/2017, 2:29 PM   I have examined the patient and reviewed assessment and plan and discussed with patient.  Agree with above as stated.  Evidence of pulmonary edema by auscultation.  I personally reviewed the echo.  It  appears that the EF has decreased and there is significant mitral regurgitation.  Mitral stenosis was noted in the past as well.  I suspect her valvular disease may be contributing to her CHF.  We may need to use higher doses of Lasix at home.    I discussed the valve issues with the patient's daughter.  I am not sure she would be a candidate for mitral valve repair given her age, frailty and renal insufficiency.  I suspect we will be managing this conservatively.    Elevated troponin may be related to heart failure.  Will follow closely.  Lance MussJayadeep Berania Peedin

## 2017-02-03 NOTE — Progress Notes (Signed)
Seen and examined by the MD. Will continue to monitor pt.

## 2017-02-03 NOTE — Progress Notes (Signed)
Paged by nursing that the patient was dyspneic with abdominal breathing. Nursing also performed a bladder scan and she had no urine, despite receiving lasix 80 @ 5PM. Unclear if she had urine output after AM lasix dose, I/Os do not record any. Went to bedside. Patient states she continues to have difficulty breath, denies CP. Vital signs stable, patient saturating 100% on 2L [place]. Midl tachypnea, Lung exam with bibasilar crackles.    Plan: -Monitor I/Os - if no UOP in 1-2 hours repeat bladder scan, may have to cath -Trending trops - no active CP; next trop @ ~11 PM -Discussed with pts nurse if O2 requirement changes to page night team   Juluis MireSam Lavone Weisel, MD Internal Medicine PGY1 Pager # [phone]

## 2017-02-03 NOTE — ED Notes (Signed)
Admitting at the bedside.  

## 2017-02-03 NOTE — Progress Notes (Signed)
CRITICAL VALUE ALERT  Critical Value:  Troponin 2.79  Date & Time Notied:  02/03/2017 1957  Provider Notified: Minda MeoLaCroce  Orders Received/Actions taken: verbalized to see pt

## 2017-02-03 NOTE — ED Triage Notes (Signed)
Patient complains of increasing shortness of breath, this am nausea with same. Has taken antibiotics x 2 with no relief. Patient with tachypnea and pale on arrival, denies CP.

## 2017-02-04 DIAGNOSIS — N179 Acute kidney failure, unspecified: Secondary | ICD-10-CM

## 2017-02-04 DIAGNOSIS — N189 Chronic kidney disease, unspecified: Secondary | ICD-10-CM

## 2017-02-04 DIAGNOSIS — E875 Hyperkalemia: Secondary | ICD-10-CM

## 2017-02-04 DIAGNOSIS — I5021 Acute systolic (congestive) heart failure: Secondary | ICD-10-CM

## 2017-02-04 DIAGNOSIS — E1122 Type 2 diabetes mellitus with diabetic chronic kidney disease: Secondary | ICD-10-CM

## 2017-02-04 LAB — MAGNESIUM: MAGNESIUM: 2.5 mg/dL — AB (ref 1.7–2.4)

## 2017-02-04 LAB — TROPONIN I
TROPONIN I: 3.23 ng/mL — AB (ref <0.03)
Troponin I: 3.15 ng/mL (ref <0.03)
Troponin I: 2.46 ng/mL (ref <0.03)

## 2017-02-04 LAB — GLUCOSE, CAPILLARY
GLUCOSE-CAPILLARY: 104 mg/dL — AB (ref 65–99)
GLUCOSE-CAPILLARY: 121 mg/dL — AB (ref 65–99)
GLUCOSE-CAPILLARY: 125 mg/dL — AB (ref 65–99)
Glucose-Capillary: 123 mg/dL — ABNORMAL HIGH (ref 65–99)

## 2017-02-04 LAB — CBC
HEMATOCRIT: 29 % — AB (ref 36.0–46.0)
HEMOGLOBIN: 9.3 g/dL — AB (ref 12.0–15.0)
MCH: 31.8 pg (ref 26.0–34.0)
MCHC: 32.1 g/dL (ref 30.0–36.0)
MCV: 99.3 fL (ref 78.0–100.0)
Platelets: 124 10*3/uL — ABNORMAL LOW (ref 150–400)
RBC: 2.92 MIL/uL — AB (ref 3.87–5.11)
RDW: 15.7 % — ABNORMAL HIGH (ref 11.5–15.5)
WBC: 7.9 10*3/uL (ref 4.0–10.5)

## 2017-02-04 LAB — BASIC METABOLIC PANEL
ANION GAP: 8 (ref 5–15)
BUN: 69 mg/dL — ABNORMAL HIGH (ref 6–20)
CALCIUM: 9 mg/dL (ref 8.9–10.3)
CO2: 22 mmol/L (ref 22–32)
Chloride: 111 mmol/L (ref 101–111)
Creatinine, Ser: 2.58 mg/dL — ABNORMAL HIGH (ref 0.44–1.00)
GFR, EST AFRICAN AMERICAN: 18 mL/min — AB (ref >60)
GFR, EST NON AFRICAN AMERICAN: 16 mL/min — AB (ref >60)
Glucose, Bld: 111 mg/dL — ABNORMAL HIGH (ref 65–99)
POTASSIUM: 4.1 mmol/L (ref 3.5–5.1)
Sodium: 141 mmol/L (ref 135–145)

## 2017-02-04 LAB — HEPARIN LEVEL (UNFRACTIONATED): Heparin Unfractionated: 0.95 IU/mL — ABNORMAL HIGH (ref 0.30–0.70)

## 2017-02-04 MED ORDER — ALBUTEROL SULFATE (2.5 MG/3ML) 0.083% IN NEBU
3.0000 mL | INHALATION_SOLUTION | RESPIRATORY_TRACT | Status: DC | PRN
  Administered 2017-02-04 (×2): 3 mL via RESPIRATORY_TRACT
  Filled 2017-02-04 (×2): qty 3

## 2017-02-04 MED ORDER — IPRATROPIUM-ALBUTEROL 0.5-2.5 (3) MG/3ML IN SOLN
3.0000 mL | RESPIRATORY_TRACT | Status: DC | PRN
Start: 2017-02-04 — End: 2017-02-06

## 2017-02-04 MED ORDER — MORPHINE SULFATE (CONCENTRATE) 10 MG/0.5ML PO SOLN: 2.5000 mg | ORAL | Status: DC | PRN

## 2017-02-04 MED ORDER — HEPARIN (PORCINE) IN NACL 100-0.45 UNIT/ML-% IJ SOLN
1000.0000 [IU]/h | INTRAMUSCULAR | Status: DC
  Administered 2017-02-04: 1000 [IU]/h via INTRAVENOUS
  Filled 2017-02-04: qty 250

## 2017-02-04 MED ORDER — METOLAZONE 2.5 MG PO TABS
2.5000 mg | ORAL_TABLET | Freq: Once | ORAL | Status: AC
  Administered 2017-02-04: 2.5 mg via ORAL
  Filled 2017-02-04: qty 1

## 2017-02-04 MED ORDER — HEPARIN BOLUS VIA INFUSION
2000.0000 [IU] | Freq: Once | INTRAVENOUS | Status: AC
  Administered 2017-02-04: 2000 [IU] via INTRAVENOUS
  Filled 2017-02-04: qty 2000

## 2017-02-04 MED ORDER — HEPARIN (PORCINE) IN NACL 100-0.45 UNIT/ML-% IJ SOLN
850.0000 [IU]/h | INTRAMUSCULAR | Status: DC
  Administered 2017-02-04: 850 [IU]/h via INTRAVENOUS

## 2017-02-04 NOTE — Evaluation (Signed)
Physical Therapy Evaluation Patient Details Name: Bethany OhmsMargie M Ballengee MRN: [id number] DOB: 10/29/1926 Today's Date: 02/04/2017   History of Present Illness  Pt is an 83 y/o female admitted secondary to dyspnea. Pt found to have acute on chronic HF with elevated troponins. Pt also found to have type II NSTEMI due to demand perfusion mismatch. PMH including but not limited to CHF, dementia, DM and HTN.  Clinical Impression  Pt presented supine in bed with HOB elevated, awake and willing to participate in therapy session. Prior to admission, pt reported that she ambulates short distances within her home with use of rollator and requires assistance with bathing. Pt is able to dress herself. Pt currently very limited secondary to fatigue and increased WOB. Pt on RA throughout session with SPO2 briefly decreasing to 88% but with activity increasing to as high as 95%. Pt only tolerated bed mobility, sitting EOB ~15 minutes, standing and taking side steps at EOB with RW and close min guard. Pt would continue to benefit from skilled physical therapy services at this time while admitted and after d/c to address the below listed limitations in order to improve overall safety and independence with functional mobility.     Follow Up Recommendations SNF;Supervision/Assistance - 24 hour;Other (comment)(if pt refuses, good [facility])    Equipment Recommendations  None recommended by PT    Recommendations for Other Services       Precautions / Restrictions Precautions Precautions: Fall Precaution Comments: watch SPO2 Restrictions Weight Bearing Restrictions: No      Mobility  Bed Mobility Overal bed mobility: Needs Assistance Bed Mobility: Supine to Sit;Sit to Supine     Supine to sit: Min guard Sit to supine: Min assist   General bed mobility comments: increased time and effort, use of bed pads, assist to return bilateral LEs to bed  Transfers Overall transfer level:  Needs assistance Equipment used: Rolling walker (2 wheeled) Transfers: Sit to/from Stand Sit to Stand: Min assist         General transfer comment: multiple attempts required, use of momentum, assist for stability with transition  Ambulation/Gait             General Gait Details: pt able to take 5 side steps towards her L side at EOB with close min guard; pt very limited secondary to DOE and increased WOB  Stairs            Wheelchair Mobility    Modified Rankin (Stroke Patients Only)       Balance Overall balance assessment: Needs assistance Sitting-balance support: Feet supported Sitting balance-Leahy Scale: Good     Standing balance support: During functional activity;Bilateral upper extremity supported Standing balance-Leahy Scale: Poor Standing balance comment: reliant on bilateral UEs on RW                             Pertinent Vitals/Pain Pain Assessment: No/denies pain    Home Living Family/patient expects to be discharged to:: Private residence Living Arrangements: Children Available Help at Discharge: Family;Friend(s);Available 24 hours/day Type of Home: House Home Access: Stairs to enter   [employer] CorporationEntrance Stairs-Number of Steps: 1 Home Layout: One level Home Equipment: Emergency planning/management officerhower seat;Walker - 4 wheels      Prior Function Level of Independence: Needs assistance   Gait / Transfers Assistance Needed: uses a rollator to ambulate  ADL's / Homemaking Assistance Needed: needs assist bathing, can dress herself  Hand Dominance        Extremity/Trunk Assessment   Upper Extremity Assessment Upper Extremity Assessment: Generalized weakness    Lower Extremity Assessment Lower Extremity Assessment: Generalized weakness    Cervical / Trunk Assessment Cervical / Trunk Assessment: Kyphotic  Communication   Communication: HOH  Cognition Arousal/Alertness: Awake/alert Behavior During Therapy: Flat affect Overall Cognitive Status:  Impaired/Different from baseline Area of Impairment: Memory                     Memory: Decreased short-term memory                General Comments      Exercises     Assessment/Plan    PT Assessment Patient needs continued PT services  PT Problem List Decreased strength;Decreased activity tolerance;Decreased balance;Decreased mobility;Decreased coordination;Decreased knowledge of use of DME;Decreased safety awareness;Decreased knowledge of precautions;Cardiopulmonary status limiting activity       PT Treatment Interventions DME instruction;Gait training;Stair training;Functional mobility training;Therapeutic activities;Therapeutic exercise;Balance training;Neuromuscular re-education;Patient/family education    PT Goals (Current goals can be found in the Care Plan section)  Acute Rehab PT Goals Patient Stated Goal: return home PT Goal Formulation: With patient Time For Goal Achievement: 02/18/17 Potential to Achieve Goals: Fair    Frequency Min 3X/week   Barriers to discharge        Co-evaluation               AM-PAC PT "6 Clicks" Daily Activity  Outcome Measure Difficulty turning over in bed (including adjusting bedclothes, sheets and blankets)?: A Lot Difficulty moving from lying on back to sitting on the side of the bed? : A Lot Difficulty sitting down on and standing up from a chair with arms (e.g., wheelchair, bedside commode, etc,.)?: Unable Help needed moving to and from a bed to chair (including a wheelchair)?: A Little Help needed walking in hospital room?: A Lot Help needed climbing 3-5 steps with a railing? : A Lot 6 Click Score: 12    End of Session   Activity Tolerance: Patient limited by fatigue Patient left: in bed;with call bell/phone within reach;with bed alarm set;with family/visitor present Nurse Communication: Mobility status PT Visit Diagnosis: Other abnormalities of gait and mobility (R26.89)    Time: [id number] PT Time  Calculation (min) (ACUTE ONLY): 30 min   Charges:   PT Evaluation $PT Eval Moderate Complexity: 1 Mod PT Treatments $Therapeutic Activity: 8-22 mins   PT G Codes:        GatewayJennifer Jaylei Fuerte, PT, DPT [phone]   Alessandra BevelsJennifer M Rexford Prevo 02/04/2017, 2:37 PM

## 2017-02-04 NOTE — Progress Notes (Signed)
CRITICAL VALUE ALERT  Critical Value: Troponin 3.23  Date & Time Notied:  02/04/2017 0315  Provider Notified: Dr.Lacroce  Orders Received/Actions taken: started Heparin drip

## 2017-02-04 NOTE — Progress Notes (Signed)
ANTICOAGULATION CONSULT NOTE -Follow up Pharmacy Consult for Heparin Indication: chest pain/ACS  No Known Allergies  Patient Measurements: Height: 5\' 4"  (162.6 cm) Weight: 186 lb 4.6 oz (84.5 kg) IBW/kg (Calculated) : 54.7 Heparin Dosing Weight: 75 kg  Vital Signs: Temp: 97.8 F (36.6 C) (11/28 0434) Temp Source: Oral (11/28 0434) BP: 133/58 (11/28 0434) Pulse Rate: 54 (11/28 0434)  Labs: Recent Labs    02/03/17 0750  02/03/17 1641 02/03/17 2309 02/04/17 0605 02/04/17 1103  HGB 10.3*  --   --   --  9.3*  --   HCT 33.7*  --   --   --  29.0*  --   PLT 123*  --   --   --  124*  --   HEPARINUNFRC  --   --   --   --   --  0.95*  CREATININE 2.64*  --   --   --  2.58*  --   TROPONINI  --    < > 2.79* 3.23* 3.15*  --    < > = values in this interval not displayed.    Estimated Creatinine Clearance: 15.8 mL/min (A) (by C-G formula based on SCr of 2.58 mg/dL (H)).   Medical History: Past Medical History:  Diagnosis Date  . Chronic diastolic congestive heart failure (HCC)    a. 02/2016: echo showing normal EF, Grade 3 DD, mild MR, trivial TR  . Dementia   . Diabetes mellitus without complication (HCC)   . Hypertension     Medications:  Medications Prior to Admission  Medication Sig Dispense Refill Last Dose  . albuterol (PROVENTIL HFA) 108 (90 Base) MCG/ACT inhaler Inhale 1 puff into the lungs every 4 (four) hours as needed for wheezing.   rescue at rescue  . aspirin EC 81 MG tablet Take 81 mg by mouth every other day.   02/01/2017 at Unknown time  . atorvastatin (LIPITOR) 40 MG tablet Take 40 mg by mouth daily.   02/02/2017 at Unknown time  . cetirizine (ZYRTEC) 10 MG tablet Take 10 mg by mouth daily as needed for allergies.   unk at [employer]  . cromolyn (OPTICROM) 4 % ophthalmic solution Place 1 drop into both eyes 4 (four) times daily.   02/02/2017 at Unknown time  . donepezil (ARICEPT) 5 MG tablet Take 5 mg by mouth daily.   02/02/2017 at Unknown time  .  dorzolamide-timolol (COSOPT) 22.3-6.8 MG/ML ophthalmic solution Place 1 drop into both eyes 2 (two) times daily.   02/02/2017 at Unknown time  . escitalopram (LEXAPRO) 10 MG tablet Take 10 mg by mouth daily.    02/02/2017 at Unknown time  . fluticasone (FLONASE) 50 MCG/ACT nasal spray Place 1 spray into both nostrils daily as needed for allergies.    unk at [employer]  . furosemide (LASIX) 40 MG tablet Take 1 tablet (40 mg total) by mouth 2 (two) times daily as needed. (Patient taking differently: Take 40 mg by mouth daily. If pt weight is 3 lbs difference from day before then pt takes 80mg ) 60 tablet 11 unk at unk  . ipratropium-albuterol (DUONEB) 0.5-2.5 (3) MG/3ML SOLN Take 3 mLs by nebulization every 4 (four) hours as needed for wheezing.   Past Week at Unknown time  . isosorbide mononitrate (IMDUR) 30 MG 24 hr tablet Take 1 tablet (30 mg total) by mouth daily. 30 tablet 0 02/02/2017 at Unknown time  . LANTUS SOLOSTAR 100 UNIT/ML Solostar Pen Inject 16 Units into the skin every morning.  02/02/2017 at Unknown time  . latanoprost (XALATAN) 0.005 % ophthalmic solution Place 1 drop into both eyes at bedtime.   02/02/2017 at Unknown time  . losartan (COZAAR) 100 MG tablet Take 100 mg by mouth daily.   02/02/2017 at Unknown time  . metoprolol tartrate (LOPRESSOR) 25 MG tablet Take 12.5 mg by mouth 2 (two) times daily.    02/02/2017 at 1900  . pioglitazone (ACTOS) 15 MG tablet Take 15 mg by mouth daily.    02/02/2017 at Unknown time  . Potassium Chloride ER 20 MEQ TBCR Take 20 mEq by mouth daily. TAKE EXTRA TABLET WITH FLUID TABLET  IF GAIN  3 LBS IN 24 HOURS 5 LBS  IN A WEEK 45 tablet 10 02/02/2017 at Unknown time    Assessment: 83 y.o. female with SOB, elevated cardiac markers, started on IV heparin infusion.  Initial 8 hour heparin level is 0.95, above goal on heparin drip rate 1000 units/hr.  H/H 9.3/29 low/stable and PLTC remains low 123>124k.  No bleeding noted.   Goal of Therapy:  Heparin level  0.3-0.7 units/ml Monitor platelets by anticoagulation protocol: Yes   Plan:  Decrease IV heparin drip to 850 units/hr Check heparin level in ~6-8 hours.   Noah Delaineuth Belissa Kooy, RPh Clinical Pharmacist [phone]-[phone] [phone] Main Pharmacy [phone] 02/04/2017,12:11 PM

## 2017-02-04 NOTE — Progress Notes (Addendum)
   Subjective: Breathing is slightly improved this AM per patient, but she appears to be more labored. She states that she lives with her daughter and feels that she does well at home. She walks with a walker and can dress/bath herself. She does not cook for herself. We discussed that her echocardiogram yesterday showed worsening of her heart function. The plan is to continues diuresing her as tolerated. She agrees. All questions and concerns addressed.   Objective: Vital signs in last 24 hours: Vitals:   02/03/17 1602 02/03/17 1949 02/03/17 2331 02/04/17 0434  BP: (!) 138/53 120/60 114/69 (!) 133/58  Pulse: 71 66 62 (!) 54  Resp: 18 (!) 24    Temp: 97.8 F (36.6 C) 98 F (36.7 C) 98.3 F (36.8 C) 97.8 F (36.6 C)  TempSrc: Oral Oral Oral Oral  SpO2: 91% 100% 100% 98%  Weight: 189 lb 13.1 oz (86.1 kg)   186 lb 4.6 oz (84.5 kg)  Height: 5\' 4"  (1.626 m)      General: Obese female with increased RR  Pulm: Good air movement  CV: RRR, holosystolic murmur at the left 5th intercostal space  Abdomen: Soft, no tenderness to palpation  Extremities: LE edema improved with tedhose in place  Assessment/Plan:  Acute on Chronic HFpEF - Patient presented with 3 days of worsening dyspnea, orthopnea, and PND. Symptoms continue to persist over the interval  - She is currently receiving IV Furosemide 80 mg BID with minimal record urine output; however, her weight has decreased 3 lbs (189-> 186) since getting to the floor  - Echocardiogram preformed on 11/27 illustrating EF of 25-30% (previously was 60-65%) with diffuse hypokinesis and G3DD and worsening mitral regurgitation.  - Continue ASA 81 mg, Metoprolol 12.5 mg BID, Atorvastatin 40 mg QD, and Imdur 30 mg QD  - Continue Furosemide 80mg  BID, monitor BMP and Mg  - Monitor strict I&O with daily weights  - Cardiology following, do not feel she is a candidate for mitral repair and suspect she will need higher diuretic   Troponinemia  - Patient  presented with elevated I-Stat troponin of 0.8, and nonspecific EKG changes including ST depression in V4-V6 and lead II  - Troponin trend: 1.34 -> 2.79 -> 3.23 -> 3.17 - Type II MI vs NSTEMI. She was started on heparin and we will continue to trend troponins - Could not find documented history of a prior cardiac cath    Hyperkalemia  - Potassium 5.2 on admission, 4.1 this AM - Holding home losartan   Acute on Chronic Kidney Disease  - CKD Stage IV  - Likely acute worsening secondary to cardiorenal syndrome  - Will continue to monitor and diurese patient   Type 2 DM - Most recent A1c 6.8  - Currently on Lantus 12 units QD with SSI  Hypertension  - Continue Metoprolol 12.5 mg BID and Imdur 30 mg QD  Dispo: Anticipated discharge in approximately >1 day(s).   Levora DredgeHelberg, Sammye Staff, MD 02/04/2017, 5:24 AM My Pager: [phone]

## 2017-02-04 NOTE — Progress Notes (Signed)
Progress Note  Patient Name: Bethany OhmsMargie M Emond Date of Encounter: 02/04/2017  Primary Cardiologist: Eldridge DaceVaranasi  Subjective   States her breathing has improved, but appears more volume overloaded this morning.   Inpatient Medications    Scheduled Meds: . aspirin EC  81 mg Oral Daily  . atorvastatin  40 mg Oral Daily  . donepezil  5 mg Oral Daily  . escitalopram  10 mg Oral Daily  . furosemide  80 mg Intravenous BID  . insulin aspart  0-9 Units Subcutaneous TID WC  . insulin glargine  12 Units Subcutaneous QHS  . isosorbide mononitrate  30 mg Oral Daily  . metoprolol tartrate  12.5 mg Oral BID  . sodium chloride flush  3 mL Intravenous Q12H   Continuous Infusions: . heparin 1,000 Units/hr (02/04/17 0253)   PRN Meds: acetaminophen **OR** acetaminophen, albuterol, ipratropium-albuterol   Vital Signs    Vitals:   02/03/17 1602 02/03/17 1949 02/03/17 2331 02/04/17 0434  BP: (!) 138/53 120/60 114/69 (!) 133/58  Pulse: 71 66 62 (!) 54  Resp: 18 (!) 24    Temp: 97.8 F (36.6 C) 98 F (36.7 C) 98.3 F (36.8 C) 97.8 F (36.6 C)  TempSrc: Oral Oral Oral Oral  SpO2: 91% 100% 100% 98%  Weight: 189 lb 13.1 oz (86.1 kg)   186 lb 4.6 oz (84.5 kg)  Height: 5\' 4"  (1.626 m)       Intake/Output Summary (Last 24 hours) at 02/04/2017 1034 Last data filed at 02/04/2017 [id number] Gross per 24 hour  Intake 214.17 ml  Output 150 ml  Net 64.17 ml   Filed Weights   02/03/17 0939 02/03/17 1602 02/04/17 0434  Weight: 184 lb 11.2 oz (83.8 kg) 189 lb 13.1 oz (86.1 kg) 186 lb 4.6 oz (84.5 kg)    Telemetry    SR - Personally Reviewed  ECG    SR with mild ST depression in lateral leads  - Personally Reviewed  Physical Exam   General: ill appearing older W female, labored breathing. Head: Normocephalic, atraumatic.  Neck: + JVD. Lungs:  Resp regular, mildly labored, diminished throughout. Heart: RRR, S1, S2, no S3, S4, systolic murmur; no rub. Abdomen: Soft, non-tender,  non-distended with normoactive bowel sounds. . Extremities: No clubbing, cyanosis, edema. Distal pedal pulses are 2+ bilaterally. Neuro: Alert and oriented X 3. Moves all extremities spontaneously. Psych: Normal affect.  Labs    Chemistry Recent Labs  Lab 02/03/17 0750 02/03/17 1016 02/04/17 0605  NA 140  --  141  K 5.2*  --  4.1  CL 107  --  111  CO2 21*  --  22  GLUCOSE 284*  --  111*  BUN 59*  --  69*  CREATININE 2.64*  --  2.58*  CALCIUM 9.2  --  9.0  PROT  --  6.6  --   ALBUMIN  --  3.3*  --   AST  --  42*  --   ALT  --  22  --   ALKPHOS  --  46  --   BILITOT  --  1.3*  --   GFRNONAA 15*  --  16*  GFRAA 17*  --  18*  ANIONGAP 12  --  8     Hematology Recent Labs  Lab 02/03/17 0750 02/04/17 0605  WBC 6.0 7.9  RBC 3.31* 2.92*  HGB 10.3* 9.3*  HCT 33.7* 29.0*  MCV 101.8* 99.3  MCH 31.1 31.8  MCHC 30.6 32.1  RDW 15.9*  15.7*  PLT 123* 124*    Cardiac Enzymes Recent Labs  Lab 02/03/17 1022 02/03/17 1641 02/03/17 2309 02/04/17 0605  TROPONINI 1.34* 2.79* 3.23* 3.15*    Recent Labs  Lab 02/03/17 0802  TROPIPOC 0.81*     BNP Recent Labs  Lab 02/03/17 1022  BNP >4,500.0*     DDimer No results for input(s): DDIMER in the last 168 hours.    Radiology    Dg Chest 2 View  Result Date: 02/03/2017 CLINICAL DATA:  CHF. EXAM: CHEST  2 VIEW COMPARISON:  06/22/2016. FINDINGS: Cardiomegaly with pulmonary vascular prominence bilateral interstitial prominence bilateral pleural effusions again noted. Similar findings noted on prior exam. Spine is consistent with CHF. IMPRESSION: Persistent CHF with pulmonary edema bilateral pleural effusions. No change prior exam. Electronically Signed   By: Maisie Fus  Register   On: 02/03/2017 08:43   US Renal  Result Date: 02/03/2017 CLINICAL DATA:  Decreased urine output. EXAM: RENAL / URINARY TRACT ULTRASOUND COMPLETE COMPARISON:  None. FINDINGS: Technically challenging and limited exam due to body habitus and  inability to breath hold. Right Kidney: Length: 9.7 cm. Thinning of renal parenchyma with mild increased renal echogenicity. No mass or hydronephrosis visualized. Left Kidney: Length: 9.7 cm. Thinning of renal parenchyma with mild increased renal echogenicity. No mass or hydronephrosis visualized. Bladder: Decompressed and not evaluated. IMPRESSION: Thinning of bilateral renal parenchyma with increased renal echogenicity suggesting chronic medical renal disease. No obstructive uropathy. Decompressed urinary bladder. Electronically Signed   By: Rubye Oaks M.D.   On: 02/03/2017 23:57    Cardiac Studies   TTE: 02/03/17  Study Conclusions  - Left ventricle: The cavity size was normal. Wall thickness was   increased in a pattern of moderate LVH. Systolic function was   severely reduced. The estimated ejection fraction was in the   range of 25% to 30%. Diffuse hypokinesis with regional variation.   Doppler parameters are consistent with restrictive left   ventricular relaxation (grade 3 diastolic dysfunction). The E/e&'   ratio is >20, suggesting markedly elevated LV filling pressure. - Aortic valve: Mildly calcified leaflets. There was no stenosis.   There was no regurgitation. - Mitral valve: Calcified annulus. Mildly thickened leaflets .   There was moderate to severe regurgitation. - Left atrium: The atrium was moderately dilated. - Right ventricle: The cavity size was mildly dilated. The   moderator band was prominent. Systolic function was normal. - Right atrium: The atrium was at the upper limits of normal in   size. - Atrial septum: No defect or patent foramen ovale was identified. - Tricuspid valve: There was moderate regurgitation. - Pulmonary arteries: PA peak pressure: 76 mm Hg (S). - Inferior vena cava: The vessel was dilated. The respirophasic   diameter changes were blunted (< 50%), consistent with elevated   central venous pressure.  Impressions:  - Compared to a  prior study in 06/2016, the LVEF has decreased from   60-65% down to 25-30%, there is grade 3 DD with very high   biventricular filling pressures, moderate to severe MR, moderate   LAE, mild RVE and upper normal RA size, moderate TR, and severe   pulmonary hypertension with an RVSP of 76 mmHg.  Patient Profile     83 y.o. female with PMH of diastolic HF, HTN, CKD, DM and HL who presented with dyspnea and found to have elevated BNP and pleural effusions.   Assessment & Plan    1. Acute on chronic diastolic HF: Presented with worsening  shortness of breath over the past several days. Weight per daughter have been stable, but patient has not been eating and drinking well. BNP >4500, CXR with bilateral pleural effusions. Reports being compliant with home lasix.  -- receiving IV lasix, but very little UOP. I&O cath with only 150cc per RN report. May need to further titrate lasix, will give a dose of metolazone now. Suspect symptoms are being driven by worsened valvular disease. EF noted significant drop to 25% with G3DD.    2. Elevated Troponin: Peaked at 3.23 now trending down. Could have underlying CAD but would not consider her a good candidate for ischemic work up at this time.   3. Severe MR/pulmonary HTN: Noted on echo, with PA pressure of 76 mmHg. Would continue with diuresis.   4. AKI on CKD: Cr 2.64>>2.58 today.  -- follow BMET  5. HTN: stable  6. HL: on statin   7. DM: SSI while inpatient  8. Hyperkalemia: resolved.   Signed, Laverda PageLindsay Roberts, NP  02/04/2017, 10:34 AM  Pager # [phone]   I have examined the patient and reviewed assessment and plan and discussed with patient.  Agree with above as stated.  Patietn resting with facemask on.  SPoke to family in the room.  [name], who is the daughter I know better, is not present. EF decreased.  Severe MR.  Not responding well to Lasix.  Recommend palliative care consult.  I don't think she is a candidate for more invasive,  aggressive therapy such as valve surgery.  Family in the room agrees and even states she would likely not want dialysis.  Will push diuretics to help with volume overload, but will have to watch renal function carefully.    Recommend palliative care consult. I will speak to [facility] when she comes in this afternoon.  Lance MussJayadeep Varanasi   For questions or updates, please contact [facility] Please consult [email or web address] for contact info under Cardiology/STEMI.

## 2017-02-04 NOTE — Progress Notes (Signed)
Internal Medicine Attending:   I saw and examined the patient. I reviewed the resident's note and I agree with the resident's findings and plan as documented in the resident's note.  83 year old woman admitted with decompensated mitral valve regurgitation complicated by acute heart failure with reduced EF. She stable this morning, conversational, diuresed three pounds yesterday. Type 2 NSTEMI due to demand perfusion mismatch is stable with troponin peaked at 3, no active chest pain, and on heparin infusion with no bleeding. Cardiology consulted and unfortunately cannot intervene on the severe mitral valve regurgitation given reduced EF and high risk for pushing CKD 4 into ESRD. I agree with pursuing a palliative care approach and we will coordinate with the patients family. Can discontinue heparin infusion as there are very limited benefits at this point.

## 2017-02-04 NOTE — Progress Notes (Signed)
ANTICOAGULATION CONSULT NOTE - Initial Consult  Pharmacy Consult for Heparin Indication: chest pain/ACS  No Known Allergies  Patient Measurements: Height: 5\' 4"  (162.6 cm) Weight: 189 lb 13.1 oz (86.1 kg) IBW/kg (Calculated) : 54.7 Heparin Dosing Weight: 75 kg  Vital Signs: Temp: 98.3 F (36.8 C) (11/27 2331) Temp Source: Oral (11/27 2331) BP: 114/69 (11/27 2331) Pulse Rate: 62 (11/27 2331)  Labs: Recent Labs    02/03/17 0750 02/03/17 1022 02/03/17 1641 02/03/17 2309  HGB 10.3*  --   --   --   HCT 33.7*  --   --   --   PLT 123*  --   --   --   CREATININE 2.64*  --   --   --   TROPONINI  --  1.34* 2.79* 3.23*    Estimated Creatinine Clearance: 15.6 mL/min (A) (by C-G formula based on SCr of 2.64 mg/dL (H)).   Medical History: Past Medical History:  Diagnosis Date  . Chronic diastolic congestive heart failure (HCC)    a. 02/2016: echo showing normal EF, Grade 3 DD, mild MR, trivial TR  . Dementia   . Diabetes mellitus without complication (HCC)   . Hypertension     Medications:  Medications Prior to Admission  Medication Sig Dispense Refill Last Dose  . albuterol (PROVENTIL HFA) 108 (90 Base) MCG/ACT inhaler Inhale 1 puff into the lungs every 4 (four) hours as needed for wheezing.   rescue at rescue  . aspirin EC 81 MG tablet Take 81 mg by mouth every other day.   02/01/2017 at Unknown time  . atorvastatin (LIPITOR) 40 MG tablet Take 40 mg by mouth daily.   02/02/2017 at Unknown time  . cetirizine (ZYRTEC) 10 MG tablet Take 10 mg by mouth daily as needed for allergies.   unk at [employer]  . cromolyn (OPTICROM) 4 % ophthalmic solution Place 1 drop into both eyes 4 (four) times daily.   02/02/2017 at Unknown time  . donepezil (ARICEPT) 5 MG tablet Take 5 mg by mouth daily.   02/02/2017 at Unknown time  . dorzolamide-timolol (COSOPT) 22.3-6.8 MG/ML ophthalmic solution Place 1 drop into both eyes 2 (two) times daily.   02/02/2017 at Unknown time  . escitalopram (LEXAPRO)  10 MG tablet Take 10 mg by mouth daily.    02/02/2017 at Unknown time  . fluticasone (FLONASE) 50 MCG/ACT nasal spray Place 1 spray into both nostrils daily as needed for allergies.    unk at [employer]  . furosemide (LASIX) 40 MG tablet Take 1 tablet (40 mg total) by mouth 2 (two) times daily as needed. (Patient taking differently: Take 40 mg by mouth daily. If pt weight is 3 lbs difference from day before then pt takes 80mg ) 60 tablet 11 unk at unk  . ipratropium-albuterol (DUONEB) 0.5-2.5 (3) MG/3ML SOLN Take 3 mLs by nebulization every 4 (four) hours as needed for wheezing.   Past Week at Unknown time  . isosorbide mononitrate (IMDUR) 30 MG 24 hr tablet Take 1 tablet (30 mg total) by mouth daily. 30 tablet 0 02/02/2017 at Unknown time  . LANTUS SOLOSTAR 100 UNIT/ML Solostar Pen Inject 16 Units into the skin every morning.    02/02/2017 at Unknown time  . latanoprost (XALATAN) 0.005 % ophthalmic solution Place 1 drop into both eyes at bedtime.   02/02/2017 at Unknown time  . losartan (COZAAR) 100 MG tablet Take 100 mg by mouth daily.   02/02/2017 at Unknown time  . metoprolol tartrate (LOPRESSOR) 25  MG tablet Take 12.5 mg by mouth 2 (two) times daily.    02/02/2017 at 1900  . pioglitazone (ACTOS) 15 MG tablet Take 15 mg by mouth daily.    02/02/2017 at Unknown time  . Potassium Chloride ER 20 MEQ TBCR Take 20 mEq by mouth daily. TAKE EXTRA TABLET WITH FLUID TABLET  IF GAIN  3 LBS IN 24 HOURS 5 LBS  IN A WEEK 45 tablet 10 02/02/2017 at Unknown time    Assessment: 83 y.o. female with SOB, elevated cardiac markers, for heparin Goal of Therapy:  Heparin level 0.3-0.7 units/ml Monitor platelets by anticoagulation protocol: Yes   Plan:  D/C SQ heparin Heparin 2000 units IV bolus, then start heparin 1000 units/hr Check heparin level in 8 hours.   Aijah Lattner, Gary FleetGregory Vernon 02/04/2017,1:17 AM

## 2017-02-04 NOTE — Consult Note (Signed)
Consultation Note Date: 02/04/2017   Patient Name: Bethany Nelson  DOB: [birth year]  MRN: [id number]  Age / Sex: 83 y.o., female  PCP: Maryland Pink, MD Referring Physician: Axel Filler, *  Reason for Consultation: Establishing goals of care  HPI/Patient Profile: 83 y.o. female  with past medical history of CHF, mitral valve regurgitation, CKD, DM  admitted on 02/03/2017 with increasing shortness of breath. Workup reveals CHF exacerbation, elevated troponin. ECHO shows worsening mitral regurg and reduced EF. Cardiology consulted. Palliative medicine consulted for [place] and symptom management.   Clinical Assessment and Goals of Care: Patient in bed, noted to be wheezing, getting ready for nebulizer treatment. Breathing is labored, she is unable to focus on conversation.   I have reviewed medical records including EPIC notes, labs and imaging, assessed the patient and then met at the bedside along with patient's daughter- [name], [place] friend- Rosalee, and granddaughter- Jerene Pitch  to discuss diagnosis prognosis, [place], EOL wishes, disposition and options. Dr. Irish Lack also participated in [place] discussion.  I introduced Palliative Medicine as specialized medical care for people living with serious illness. It focuses on providing relief from the symptoms and stress of a serious illness. The goal is to improve quality of life for both the patient and the family.  We discussed a brief life review of the patient. She lives at home with [name]. She is homebound due to her dyspnea.  As far as functional and nutritional status. Prior to admission she was ambulating around her home, and independent with ADL's, but left her home only for MD appointments. Roselee notes that she has not been eating and that during this admission it is taking her much energy to even try and eat.   Dr. Irish Lack discussed their  current illness and what it means in the larger context of their on-going co-morbidities.  Natural disease trajectory and expectations at EOL were discussed.   I attempted to elicit values and goals of care important to the patient. Family states that being at home is important to her.     The difference between aggressive medical intervention and comfort care was considered in light of the patient's goals of care. Her daughter and friend state she would not want aggressive interventions. She would not be candidate for surgery or dialysis.   Advanced directives, concepts specific to code status, artifical feeding and hydration, and rehospitalization were considered and discussed. [name] notes that she was previously DNR status but that she changed to full code during this admission. We discussed possibly resuming DNR status as a full resuscitative effort would not change overall heart or kidney clinical picture.  We had long discussion related to strategies for symptom management. [name] is reluctant to try opioid for her mother's breathing. We discussed that this will not hasten her mother's death. Low dose opioid is only to open airways and frequently stabilizes breathing in CHF patient's. [name] agreed for low dose opioid orders prn for patient SOB.    Primary Decision Maker NEXT OF KIN- patient's daughter-  [place] medically during this hospitalization then d/c home with hospice -Family would like to defer code discussion until patient is discharged home- they would like Hospice provider to discuss with patient in her home   -Oxycodone solution 2.77m po q2hr prn SOB -Care manager referral for Hospice services at discharge  Code Status/Advance Care Planning:  Full code   Palliative Prophylaxis:   Bowel Regimen and Frequent Pain Assessment  Prognosis:    < 3 months d/t end stage CHF, CKD, decreased functional status  Discharge Planning:  Home with Hospice  Primary Diagnoses: Present on Admission: . CKD (chronic kidney disease), stage IV ([place] . Elevated troponin . Hypertension . Acute on chronic diastolic CHF (congestive heart failure) ([place]   I have reviewed the medical record, interviewed the patient and family, and examined the patient. The following aspects are pertinent.  Past Medical History:  Diagnosis Date  . Chronic diastolic congestive heart failure ([place]    a. 02/2016: echo showing normal EF, Grade 3 DD, mild MR, trivial TR  . Dementia   . Diabetes mellitus without complication ([place]   . Hypertension    Social History   Socioeconomic History  . Marital status: Widowed    Spouse name: None  . Number of children: None  . Years of education: None  . Highest education level: None  Social Needs  . Financial resource strain: None  . Food insecurity - worry: None  . Food insecurity - inability: None  . Transportation needs - medical: None  . Transportation needs - non-medical: None  Occupational History  . None  Tobacco Use  . Smoking status: Never Smoker  . Smokeless tobacco: Never Used  Substance and Sexual Activity  . Alcohol use: No  . Drug use: No  . Sexual activity: None  Other Topics Concern  . None  Social History Narrative  . None   Family History  Problem Relation Age of Onset  . Esophageal cancer Mother   . Heart disease Neg Hx    Scheduled Meds: . aspirin EC  81 mg Oral Daily  . atorvastatin  40 mg Oral Daily  . donepezil  5 mg Oral Daily  . escitalopram  10 mg Oral Daily  . furosemide  80 mg Intravenous BID  . insulin aspart  0-9 Units Subcutaneous TID WC  . insulin glargine  12 Units Subcutaneous QHS  . isosorbide mononitrate  30 mg Oral Daily  . metoprolol tartrate  12.5 mg Oral BID  . sodium chloride flush  3 mL Intravenous Q12H   Continuous Infusions: PRN Meds:.acetaminophen **OR** acetaminophen, albuterol, ipratropium-albuterol Medications Prior to Admission:    Prior to Admission medications   Medication Sig Start Date End Date Taking? Authorizing Provider  albuterol (PROVENTIL HFA) 108 (90 Base) MCG/ACT inhaler Inhale 1 puff into the lungs every 4 (four) hours as needed for wheezing. 08/18/16 08/18/17 Yes [provider]  aspirin EC 81 MG tablet Take 81 mg by mouth every other day.   Yes [provider]  atorvastatin (LIPITOR) 40 MG tablet Take 40 mg by mouth daily. 01/08/16  Yes [provider]  cetirizine (ZYRTEC) 10 MG tablet Take 10 mg by mouth daily as needed for allergies.   Yes [provider]  cromolyn (OPTICROM) 4 % ophthalmic solution Place 1 drop into both eyes 4 (four) times daily.   Yes [provider]  donepezil (ARICEPT) 5 MG tablet Take 5 mg by mouth daily. 05/13/16  Yes [provider]  dorzolamide-timolol (COSOPT) 22.3-6.8 MG/ML ophthalmic solution Place 1 drop into both eyes 2 (two) times daily. 12/16/15  Yes [provider]  escitalopram (LEXAPRO) 10 MG tablet Take 10 mg by mouth daily.    Yes [provider]  fluticasone (FLONASE) 50 MCG/ACT nasal spray Place 1 spray into both nostrils daily as needed for allergies.  02/17/16  Yes [provider]  furosemide (LASIX) 40 MG tablet Take 1 tablet (40 mg total) by mouth 2 (two) times daily as needed. Patient taking differently: Take 40 mg by mouth daily. If pt weight is 3 lbs difference from day before then pt takes 79m 09/08/16  Yes VJettie Booze MD  ipratropium-albuterol (DUONEB) 0.5-2.5 (3) MG/3ML SOLN Take 3 mLs by nebulization every 4 (four) hours as needed for wheezing. 01/31/17  Yes [provider]  isosorbide mononitrate (IMDUR) 30 MG 24 hr tablet Take 1 tablet (30 mg total) by mouth daily. 06/26/16  Yes SThurnell Lose MD  LANTUS SOLOSTAR 100 UNIT/ML Solostar Pen Inject 16 Units into the skin every morning.  01/09/16  Yes [provider]  latanoprost (XALATAN) 0.005 % ophthalmic  solution Place 1 drop into both eyes at bedtime. 02/15/16  Yes [provider]  losartan (COZAAR) 100 MG tablet Take 100 mg by mouth daily. 10/13/16  Yes [provider]  metoprolol tartrate (LOPRESSOR) 25 MG tablet Take 12.5 mg by mouth 2 (two) times daily.  01/09/16  Yes [provider]  pioglitazone (ACTOS) 15 MG tablet Take 15 mg by mouth daily.  07/21/16  Yes [provider]  Potassium Chloride ER 20 MEQ TBCR Take 20 mEq by mouth daily. TAKE EXTRA TABLET WITH FLUID TABLET  IF GAIN  3 LBS IN 24 HOURS 5 LBS  IN A WEEK 07/16/16  Yes LImogene Burn PA-C   No Known Allergies Review of Systems  Physical Exam  Constitutional: She appears well-developed and well-nourished.  frail  Cardiovascular:  tachycardic  Pulmonary/Chest: She has wheezes.  Skin: There is pallor.  Nursing note and vitals reviewed.   Vital Signs: BP (!) 124/51 (BP Location: Left Arm)   Pulse (!) 57   Temp 97.8 F (36.6 C) (Oral)   Resp 18   Ht _0  (1.626 m)   Wt 84.5 kg (186 lb 4.6 oz)   SpO2 96%   BMI 31.98 kg/m  Pain Assessment: No/denies pain   Pain Score: Asleep   SpO2: SpO2: 96 % O2 Device:SpO2: 96 % O2 Flow Rate: .O2 Flow Rate (L/min): 2 L/min  IO: Intake/output summary:   Intake/Output Summary (Last 24 hours) at 02/04/2017 1623 Last data filed at 02/04/2017 1417 Gross per 24 hour  Intake 511.17 ml  Output 150 ml  Net 361.17 ml    LBM: Last BM Date: 02/02/17 Baseline Weight: Weight: 83.8 kg (184 lb 11.2 oz) Most recent weight: Weight: 84.5 kg (186 lb 4.6 oz)     Palliative Assessment/Data: PPS: 40%     Thank you for this consult. Palliative medicine will continue to follow and assist as needed.   Time In: 1500 Time Out: 1630 Time Total: 90 minutes Greater than 50%  of this time was spent counseling and coordinating care related to the above assessment and plan.  Signed by: KMariana Kaufman AGNP-C Palliative Medicine    Please contact Palliative  Medicine Team phone at [phone] questions and concerns.  For individual provider: See AShea Evans

## 2017-02-05 DIAGNOSIS — Z7189 Other specified counseling: Secondary | ICD-10-CM

## 2017-02-05 DIAGNOSIS — I059 Rheumatic mitral valve disease, unspecified: Secondary | ICD-10-CM

## 2017-02-05 DIAGNOSIS — R0602 Shortness of breath: Secondary | ICD-10-CM

## 2017-02-05 DIAGNOSIS — R34 Anuria and oliguria: Secondary | ICD-10-CM

## 2017-02-05 DIAGNOSIS — Z515 Encounter for palliative care: Secondary | ICD-10-CM

## 2017-02-05 LAB — CBC
HCT: 28.2 % — ABNORMAL LOW (ref 36.0–46.0)
Hemoglobin: 8.9 g/dL — ABNORMAL LOW (ref 12.0–15.0)
MCH: 31.8 pg (ref 26.0–34.0)
MCHC: 31.6 g/dL (ref 30.0–36.0)
MCV: 100.7 fL — AB (ref 78.0–100.0)
PLATELETS: 109 10*3/uL — AB (ref 150–400)
RBC: 2.8 MIL/uL — AB (ref 3.87–5.11)
RDW: 16 % — AB (ref 11.5–15.5)
WBC: 5.3 10*3/uL (ref 4.0–10.5)

## 2017-02-05 LAB — BASIC METABOLIC PANEL
Anion gap: 7 (ref 5–15)
BUN: 74 mg/dL — ABNORMAL HIGH (ref 6–20)
CALCIUM: 8.5 mg/dL — AB (ref 8.9–10.3)
CO2: 26 mmol/L (ref 22–32)
CREATININE: 2.74 mg/dL — AB (ref 0.44–1.00)
Chloride: 107 mmol/L (ref 101–111)
GFR calc Af Amer: 17 mL/min — ABNORMAL LOW (ref >60)
GFR calc non Af Amer: 14 mL/min — ABNORMAL LOW (ref >60)
GLUCOSE: 74 mg/dL (ref 65–99)
Potassium: 3.4 mmol/L — ABNORMAL LOW (ref 3.5–5.1)
Sodium: 140 mmol/L (ref 135–145)

## 2017-02-05 LAB — GLUCOSE, CAPILLARY
GLUCOSE-CAPILLARY: 131 mg/dL — AB (ref 65–99)
Glucose-Capillary: 73 mg/dL (ref 65–99)
Glucose-Capillary: 107 mg/dL — ABNORMAL HIGH (ref 65–99)
Glucose-Capillary: 152 mg/dL — ABNORMAL HIGH (ref 65–99)

## 2017-02-05 LAB — MAGNESIUM: Magnesium: 2.4 mg/dL (ref 1.7–2.4)

## 2017-02-05 MED ORDER — OXYCODONE HCL 5 MG/5ML PO SOLN
2.5000 mg | ORAL | Status: DC | PRN
  Administered 2017-02-05: 2.5 mg via ORAL
  Filled 2017-02-05: qty 5

## 2017-02-05 MED ORDER — ENOXAPARIN SODIUM 30 MG/0.3ML ~~LOC~~ SOLN
30.0000 mg | SUBCUTANEOUS | Status: DC
  Administered 2017-02-05: 30 mg via SUBCUTANEOUS
  Filled 2017-02-05: qty 0.3

## 2017-02-05 MED ORDER — FUROSEMIDE 80 MG PO TABS
80.0000 mg | ORAL_TABLET | Freq: Two times a day (BID) | ORAL | Status: DC
  Administered 2017-02-05 – 2017-02-06 (×2): 80 mg via ORAL
  Filled 2017-02-05 (×3): qty 1

## 2017-02-05 MED ORDER — POTASSIUM CHLORIDE CRYS ER 20 MEQ PO TBCR
40.0000 meq | EXTENDED_RELEASE_TABLET | Freq: Once | ORAL | Status: AC
  Administered 2017-02-05: 40 meq via ORAL
  Filled 2017-02-05 (×2): qty 2

## 2017-02-05 MED ORDER — SENNA 8.6 MG PO TABS
1.0000 | ORAL_TABLET | Freq: Two times a day (BID) | ORAL | Status: DC
  Administered 2017-02-05 – 2017-02-06 (×2): 8.6 mg via ORAL
  Filled 2017-02-05 (×2): qty 1

## 2017-02-05 NOTE — Progress Notes (Signed)
Progress Note  Patient Name: Bethany Nelson Date of Encounter: 02/05/2017  Primary Cardiologist: Eldridge DaceVaranasi  Subjective   Sitting up in the chair today, looks like she is feels much better.   Inpatient Medications    Scheduled Meds: . aspirin EC  81 mg Oral Daily  . atorvastatin  40 mg Oral Daily  . donepezil  5 mg Oral Daily  . escitalopram  10 mg Oral Daily  . furosemide  80 mg Oral BID  . insulin aspart  0-9 Units Subcutaneous TID WC  . insulin glargine  12 Units Subcutaneous QHS  . isosorbide mononitrate  30 mg Oral Daily  . metoprolol tartrate  12.5 mg Oral BID  . sodium chloride flush  3 mL Intravenous Q12H   Continuous Infusions:  PRN Meds: acetaminophen **OR** acetaminophen, albuterol, ipratropium-albuterol, oxyCODONE   Vital Signs    Vitals:   02/04/17 1222 02/04/17 2006 02/05/17 0446 02/05/17 1011  BP: (!) 124/51 (!) 100/44 (!) 122/53 (!) 116/47  Pulse: (!) 57 (!) 57 (!) 56 63  Resp: 18 18 18    Temp: 97.8 F (36.6 C) (!) 97.4 F (36.3 C) 97.9 F (36.6 C)   TempSrc: Oral Oral Oral   SpO2: 96% 97% 98% 99%  Weight:   187 lb 6.3 oz (85 kg)   Height:        Intake/Output Summary (Last 24 hours) at 02/05/2017 1042 Last data filed at 02/05/2017 0947 Gross per 24 hour  Intake 660 ml  Output -  Net 660 ml   Filed Weights   02/03/17 1602 02/04/17 0434 02/05/17 0446  Weight: 189 lb 13.1 oz (86.1 kg) 186 lb 4.6 oz (84.5 kg) 187 lb 6.3 oz (85 kg)    Telemetry    SR - Personally Reviewed  Physical Exam   General: ill appearing, older W female appearing in no acute distress. Head: Normocephalic, atraumatic.  Neck: + JVD. Lungs:  Resp regular, mildly labored, diminished throughout. Heart: RRR, S1, S2, no S3, S4, systolic murmur; no rub. Abdomen: Soft, non-tender, non-distended with normoactive bowel sounds. Extremities: No clubbing, cyanosis, edema. Distal pedal pulses are 2+ bilaterally. Neuro: Alert and oriented X 3. Moves all extremities  spontaneously. Psych: Normal affect.  Labs    Chemistry Recent Labs  Lab 02/03/17 0750 02/03/17 1016 02/04/17 0605 02/05/17 0414  NA 140  --  141 140  K 5.2*  --  4.1 3.4*  CL 107  --  111 107  CO2 21*  --  22 26  GLUCOSE 284*  --  111* 74  BUN 59*  --  69* 74*  CREATININE 2.64*  --  2.58* 2.74*  CALCIUM 9.2  --  9.0 8.5*  PROT  --  6.6  --   --   ALBUMIN  --  3.3*  --   --   AST  --  42*  --   --   ALT  --  22  --   --   ALKPHOS  --  46  --   --   BILITOT  --  1.3*  --   --   GFRNONAA 15*  --  16* 14*  GFRAA 17*  --  18* 17*  ANIONGAP 12  --  8 7     Hematology Recent Labs  Lab 02/03/17 0750 02/04/17 0605 02/05/17 0414  WBC 6.0 7.9 5.3  RBC 3.31* 2.92* 2.80*  HGB 10.3* 9.3* 8.9*  HCT 33.7* 29.0* 28.2*  MCV 101.8* 99.3 100.7*  MCH 31.1 31.8 31.8  MCHC 30.6 32.1 31.6  RDW 15.9* 15.7* 16.0*  PLT 123* 124* 109*    Cardiac Enzymes Recent Labs  Lab 02/03/17 1641 02/03/17 2309 02/04/17 0605 02/04/17 1103  TROPONINI 2.79* 3.23* 3.15* 2.46*    Recent Labs  Lab 02/03/17 0802  TROPIPOC 0.81*     BNP Recent Labs  Lab 02/03/17 1022  BNP >4,500.0*     DDimer No results for input(s): DDIMER in the last 168 hours.    Radiology    US Renal  Result Date: 02/03/2017 CLINICAL DATA:  Decreased urine output. EXAM: RENAL / URINARY TRACT ULTRASOUND COMPLETE COMPARISON:  None. FINDINGS: Technically challenging and limited exam due to body habitus and inability to breath hold. Right Kidney: Length: 9.7 cm. Thinning of renal parenchyma with mild increased renal echogenicity. No mass or hydronephrosis visualized. Left Kidney: Length: 9.7 cm. Thinning of renal parenchyma with mild increased renal echogenicity. No mass or hydronephrosis visualized. Bladder: Decompressed and not evaluated. IMPRESSION: Thinning of bilateral renal parenchyma with increased renal echogenicity suggesting chronic medical renal disease. No obstructive uropathy. Decompressed urinary bladder.  Electronically Signed   By: Rubye Oaks M.D.   On: 02/03/2017 23:57    Cardiac Studies   TTE: 02/03/17  Study Conclusions  - Left ventricle: The cavity size was normal. Wall thickness was increased in a pattern of moderate LVH. Systolic function was severely reduced. The estimated ejection fraction was in the range of 25% to 30%. Diffuse hypokinesis with regional variation. Doppler parameters are consistent with restrictive left ventricular relaxation (grade 3 diastolic dysfunction). The E/e&' ratio is >20, suggesting markedly elevated LV filling pressure. - Aortic valve: Mildly calcified leaflets. There was no stenosis. There was no regurgitation. - Mitral valve: Calcified annulus. Mildly thickened leaflets . There was moderate to severe regurgitation. - Left atrium: The atrium was moderately dilated. - Right ventricle: The cavity size was mildly dilated. The moderator band was prominent. Systolic function was normal. - Right atrium: The atrium was at the upper limits of normal in size. - Atrial septum: No defect or patent foramen ovale was identified. - Tricuspid valve: There was moderate regurgitation. - Pulmonary arteries: PA peak pressure: 76 mm Hg (S). - Inferior vena cava: The vessel was dilated. The respirophasic diameter changes were blunted (<50%), consistent with elevated central venous pressure.  Impressions:  - Compared to a prior study in 06/2016, the LVEF has decreased from 60-65% down to 25-30%, there is grade 3 DD with very high biventricular filling pressures, moderate to severe MR, moderate LAE, mild RVE and upper normal RA size, moderate TR, and severe pulmonary hypertension with an RVSP of 76 mmHg.  Patient Profile     83 y.o. female with PMH of diastolic HF, HTN, CKD, DM and HL who presented with dyspnea and found to have elevated BNP and pleural effusions.  Assessment & Plan    1. Acute on chronic diastolic  NF:AOZHYQMVH with worsening shortness of breath over the past several days. Weight per daughter have been stable, but patient has not been eating and drinking well. BNP >4500, CXR with bilateral pleural effusions. Reports being compliant with home lasix.  -- now on oral lasix, but very little UOP, but several episodes of incontinence. Weight slightly up today. But overall feels better today.  2. Elevated Troponin:Peaked at 3.23 now trending down. Continues to deny any chest pain.    3. Severe MR/pulmonary HTN: Noted on echo, with PA pressure of 76 mmHg. Not a candidate  for invasive therapy.    4. AKI on CKD:Cr 2.64>>2.58>>2.74 -- follow BMET  5. Hypokalemia: Replete per primary    Palliative care meeting with the family yesterday. Home hospice planned for patient at time of discharge.    Signed, Laverda PageLindsay Roberts, NP  02/05/2017, 10:42 AM  Pager # [phone]   For questions or updates, please contact [facility] Please consult [email or web address] for contact info under Cardiology/STEMI.   I have examined the patient and reviewed assessment and plan and discussed with patient.  Agree with above as stated.  I was present for the meeting with palliative care yesterday.  Options for home hospice were given to the patient's family.  THey are considering things.  Continue attempts at diuresis.  They understand we are limited by renal function and that dialysis is not a good option.  She may be close to discharge from a cardiac stanpoint if she can maintain her sats on [place] oxygen.   Lance MussJayadeep Kym Fenter

## 2017-02-05 NOTE — Progress Notes (Addendum)
[facility]  RN  Update:  Per Steward DroneBrenda, [facility]RN, patient going home today.  I contacted referral center to advise of change.   Her AV is set up for 1000am tomorrow.  Referral Center will update [name], daughter, of same to ensure time is okay.   I ordered O2 concentrator for home delivery.  Discharge is not dependent on delivery per [facility].    Thank you again for this referral.  Adele BarthelAmy Evans, RN, BSN [facility] Hospital Liaison [phone]  All hospital liaisons are now on AMION.

## 2017-02-05 NOTE — Evaluation (Signed)
Occupational Therapy Evaluation Patient Details Name: Bethany OhmsMargie M Nelson MRN: [id number] DOB: 03/25/1926 Today's Date: 02/05/2017    History of Present Illness Pt is an 83 y/o female admitted secondary to dyspnea. Pt found to have acute on chronic HF with elevated troponins. Pt also found to have type II NSTEMI due to demand perfusion mismatch. PMH including but not limited to CHF, dementia, DM and HTN.   Clinical Impression   PTA Pt used rollator for mobility and was supervision for ADL ("My daughter helps me sometimes though"). Pt is presenting today and is mod A for LB ADL, set up/min A for UB ADL. Min assist for toilet transfer stand pivot. Full OT problem list below. Pt will benefit from skilled OT while in acute setting and will require SNF level therapies to return to PLOF. RN did mention that the care team and family are discussing home with hospice as possibility. OT will continue to monitor for therapy appropriateness. Next session to focus on energy conservation education for ADL.     Follow Up Recommendations  SNF;Supervision/Assistance - 24 hour    Equipment Recommendations  3 in 1 bedside commode;Other (comment)(defer to next venue of care)    Recommendations for Other Services       Precautions / Restrictions Precautions Precautions: Fall Precaution Comments: watch SPO2 Restrictions Weight Bearing Restrictions: No      Mobility Bed Mobility Overal bed mobility: Needs Assistance Bed Mobility: Supine to Sit     Supine to sit: Min assist     General bed mobility comments: increased time and effort, use of bed pads to assist bringing hips EOB  Transfers Overall transfer level: Needs assistance Equipment used: Rolling walker (2 wheeled) Transfers: Sit to/from Stand Sit to Stand: Min assist         General transfer comment: multiple attempts required, assist for power up and stability    Balance Overall balance assessment: Needs assistance Sitting-balance  support: Feet supported Sitting balance-Leahy Scale: Good     Standing balance support: During functional activity;Bilateral upper extremity supported Standing balance-Leahy Scale: Poor Standing balance comment: reliant on bilateral UEs on RW                           ADL either performed or assessed with clinical judgement   ADL Overall ADL's : Needs assistance/impaired Eating/Feeding: Set up   Grooming: Set up;Sitting;Wash/dry hands;Wash/dry face Grooming Details (indicate cue type and reason): in recliner Upper Body Bathing: Min guard;Sitting   Lower Body Bathing: Minimal assistance;Sitting/lateral leans Lower Body Bathing Details (indicate cue type and reason): educated Pt on shower safety - she only bathes when her daughter is around and can help her if needed. Fall prevention education completed Upper Body Dressing : Min guard   Lower Body Dressing: Moderate assistance;Sit to/from stand Lower Body Dressing Details (indicate cue type and reason): assist to adjust socks Toilet Transfer: Minimal assistance;Stand-pivot;RW Toilet Transfer Details (indicate cue type and reason): min A for boost and balance upon standing, then min guard for pivotal steps to recliner (simulated toilet transfer) Toileting- Clothing Manipulation and Hygiene: Minimal assistance       Functional mobility during ADLs: Minimal assistance;Rolling walker General ADL Comments: Pt impacted by decreased activity tolerance, generalized weakness     Vision Patient Visual Report: No change from baseline       Perception     Praxis      Pertinent Vitals/Pain Pain Assessment: No/denies pain  Hand Dominance Right   Extremity/Trunk Assessment Upper Extremity Assessment Upper Extremity Assessment: Generalized weakness   Lower Extremity Assessment Lower Extremity Assessment: Generalized weakness   Cervical / Trunk Assessment Cervical / Trunk Assessment: Kyphotic   Communication  Communication Communication: HOH   Cognition Arousal/Alertness: Awake/alert Behavior During Therapy: Flat affect Overall Cognitive Status: Impaired/Different from baseline Area of Impairment: Memory                     Memory: Decreased short-term memory         General Comments: Pt pleasant and cooperative throughout session   General Comments       Exercises     Shoulder Instructions      Home Living Family/patient expects to be discharged to:: Private residence Living Arrangements: Children Available Help at Discharge: Family;Friend(s);Available 24 hours/day Type of Home: House Home Access: Stairs to enter Entergy CorporationEntrance Stairs-Number of Steps: 1   Home Layout: One level     Bathroom Shower/Tub: Producer, television/film/videoWalk-in shower   Bathroom Toilet: Standard Bathroom Accessibility: Yes How Accessible: Accessible via walker Home Equipment: Shower seat;Walker - 4 wheels          Prior Functioning/Environment Level of Independence: Needs assistance  Gait / Transfers Assistance Needed: uses a rollator to ambulate ADL's / Homemaking Assistance Needed: needs assist bathing, can dress herself with occasional assist from her daughter            OT Problem List: Decreased strength;Decreased activity tolerance;Impaired balance (sitting and/or standing);Decreased safety awareness;Cardiopulmonary status limiting activity      OT Treatment/Interventions: Self-care/ADL training;Therapeutic exercise;Energy conservation;Therapeutic activities;Patient/family education;Balance training    OT Goals(Current goals can be found in the care plan section) Acute Rehab OT Goals Patient Stated Goal: return home OT Goal Formulation: With patient Time For Goal Achievement: 02/19/17 Potential to Achieve Goals: Fair ADL Goals Pt Will Perform Grooming: with modified independence;sitting Pt Will Transfer to Toilet: with supervision;ambulating Pt Will Perform Toileting - Clothing Manipulation and  hygiene: with supervision;sit to/from stand Additional ADL Goal #1: Pt will recall 3 ways of conserving energy during ADL with less than 2 verbal cues  OT Frequency: Min 2X/week   Barriers to D/C:            Co-evaluation              AM-PAC PT "6 Clicks" Daily Activity     Outcome Measure Help from another person eating meals?: A Little Help from another person taking care of personal grooming?: A Little Help from another person toileting, which includes using toliet, bedpan, or urinal?: A Lot Help from another person bathing (including washing, rinsing, drying)?: A Lot Help from another person to put on and taking off regular upper body clothing?: A Little Help from another person to put on and taking off regular lower body clothing?: A Lot 6 Click Score: 15   End of Session Equipment Utilized During Treatment: Gait belt;Rolling walker;Oxygen(2L throughout session, Pt at 95% or higher) Nurse Communication: Mobility status(in room taking vitals)  Activity Tolerance: Patient tolerated treatment well Patient left: in chair;with nursing/sitter in room;with chair alarm set  OT Visit Diagnosis: Unsteadiness on feet (R26.81);Other abnormalities of gait and mobility (R26.89);Muscle weakness (generalized) (M62.81)                Time: [id number] OT Time Calculation (min): 25 min Charges:  OT General Charges $OT Visit: 1 Visit OT Evaluation $OT Eval Moderate Complexity: 1 Mod OT Treatments $Self Care/Home Management : 8-22  mins G-Codes:     Sherryl Manges OTR/L [phone]  Evern Bio Sherhonda Gaspar 02/05/2017, 10:42 AM

## 2017-02-05 NOTE — Progress Notes (Signed)
Daily Progress Note   Patient Name: Bethany Nelson       Date: 02/05/2017 DOB: 06/17/1926  Age: 83 y.o. MRN#: [id number] Attending Physician: Axel Filler, * Primary Care Physician: [place] Pink, MD Admit Date: 02/03/2017  Reason for Consultation/Follow-up: Establishing goals of care and Non pain symptom management  Subjective: Patient sitting up in chair. No wheezing, but visibly with increased WOB. States her breathing feels better. Daughter at bedside. They have met with [facility] rep. Patient very [place]. Had limited discussion with patient re: prognosis. She understands she has irreversible disease process. Unclear how much she understands about how poor her prognosis is. With daughter's permission also discussed Code status. Patient stated if she were to die, she would not want to be brought back to life. However, her daughter, became very upset during the conversation and left the room. Further discussion regarding code status will be deferred to Hospice at home as patient is being discharged with Hospice today.  Discussed symptom management with daughter- recommend using oxycodone solution as premed before sleeping at night to assist with sleeping. Also may help with activities like eating and bathing. Also discussed necessity of daily bowel prophylaxis with use of opioid.  Review of Systems  Constitutional: Positive for malaise/fatigue.  HENT: Positive for hearing loss.   Respiratory: Positive for cough and sputum production.   Cardiovascular: Negative for chest pain.  Neurological: Positive for weakness.  Psychiatric/Behavioral: Positive for memory loss.    Length of Stay: 2  Current Medications: Scheduled Meds:  . aspirin EC  81 mg Oral Daily  . atorvastatin  40 mg Oral  Daily  . donepezil  5 mg Oral Daily  . enoxaparin (LOVENOX) injection  30 mg Subcutaneous Q24H  . escitalopram  10 mg Oral Daily  . furosemide  80 mg Oral BID  . insulin aspart  0-9 Units Subcutaneous TID WC  . insulin glargine  12 Units Subcutaneous QHS  . isosorbide mononitrate  30 mg Oral Daily  . metoprolol tartrate  12.5 mg Oral BID  . potassium chloride  40 mEq Oral Once  . senna  1 tablet Oral BID  . sodium chloride flush  3 mL Intravenous Q12H    Continuous Infusions:   PRN Meds: acetaminophen **OR** acetaminophen, albuterol, ipratropium-albuterol, oxyCODONE  Physical Exam  Constitutional: She appears  well-developed.  Pulmonary/Chest: Tachypnea noted. She has no wheezes.  Neurological: She is alert.  Psychiatric: She has a normal mood and affect.  Nursing note and vitals reviewed.           Vital Signs: BP (!) 107/45 (BP Location: Left Arm)   Pulse (!) 57   Temp 98 F (36.7 C) (Oral)   Resp 18   Ht 5' 4"  (1.626 m)   Wt 85 kg (187 lb 6.3 oz)   SpO2 100%   BMI 32.17 kg/m  SpO2: SpO2: 100 % O2 Device: O2 Device: Nasal Cannula O2 Flow Rate: O2 Flow Rate (L/min): 2 L/min  Intake/output summary:   Intake/Output Summary (Last 24 hours) at 02/05/2017 1456 Last data filed at 02/05/2017 0947 Gross per 24 hour  Intake 360 ml  Output -  Net 360 ml   LBM: Last BM Date: 02/02/17 Baseline Weight: Weight: 83.8 kg (184 lb 11.2 oz) Most recent weight: Weight: 85 kg (187 lb 6.3 oz)       Palliative Assessment/Data: PPS: 20%    Flowsheet Rows     Most Recent Value  Intake Tab  Date Notified  02/04/17  Palliative Care Type  New Palliative care  Reason for referral  Clarify Goals of Care  Date of Admission  02/03/17  Date first seen by Palliative Care  02/04/17  # of days Palliative referral response time  0 Day(s)  # of days IP prior to Palliative referral  1  Clinical Assessment  Psychosocial & Spiritual Assessment  Palliative Care Outcomes      Patient  Active Problem List   Diagnosis Date Noted  . Palliative care by specialist   . Advance care planning   . Mitral valve disorder   . Decreased urine output   . Dyspnea 02/03/2017  . Arthritis 07/16/2016  . Depression 07/16/2016  . Thyroid disease 07/16/2016  . Elevated troponin 06/22/2016  . Acute respiratory failure with hypoxia ([place]) 06/22/2016  . Hypertensive heart and chronic kidney disease with heart failure and stage 1 through stage 4 chronic kidney disease, or chronic kidney disease ([place]) 05/12/2016  . Chronic diastolic heart failure ([place]) 05/12/2016  . Hyperlipidemia, unspecified 05/12/2016  . Acute on chronic diastolic CHF (congestive heart failure) ([place]) 02/21/2016  . Hypertension 02/21/2016  . Type 2 diabetes mellitus with stage 3 chronic kidney disease, with long-term current use of insulin ([place]) 02/21/2016  . CKD (chronic kidney disease), stage IV ([place]) 02/21/2016  . New onset of congestive heart failure ([place]) 02/21/2016    Palliative Care Assessment & Plan   Patient Profile: 83 y.o. female  with past medical history of CHF, mitral valve regurgitation, CKD, DM  admitted on 02/03/2017 with increasing shortness of breath. Workup reveals CHF exacerbation, elevated troponin. ECHO shows worsening mitral regurg and reduced EF. Cardiology consulted. Palliative medicine consulted for [place] and symptom management.   Assessment/Recommendations/Plan   Oxycodone solution 2.10m q2hr prn for SOB   Senna 1 tab po bid for bowel prophylaxis  Goals of Care and Additional Recommendations:  Limitations on Scope of Treatment: Avoid Hospitalization and Minimize Medications  Code Status:  Full code  Prognosis:   < 3 months d/t advanced end stage CHF, plan to d/c home with Hospice support  Discharge Planning:  Home with Hospice  Care plan was discussed with patient and daughter.  Thank you for allowing the Palliative Medicine Team to assist in the care of this patient.   Time  In: 1400 Time Out: 1435 Total  Time 30 mins Prolonged Time Billed No      Greater than 50%  of this time was spent counseling and coordinating care related to the above assessment and plan.  Mariana Kaufman, AGNP-C Palliative Medicine   Please contact Palliative Medicine Team phone at [phone] for questions and concerns.

## 2017-02-05 NOTE — Plan of Care (Signed)
  Education: Knowledge of General Education information will improve 02/05/2017 0740 - Completed/Met by Evert Kohl, RN

## 2017-02-05 NOTE — Progress Notes (Addendum)
[facility]  RN visit  Notified by Jiles CrockerBrenda Chandler, CMRN, of patient/family request for [facility] at [facility] after discharge.  Chart and patient information [facility].  Hospice eligibility pending at this time.   Writer spoke with [name], daughter and a family friend outside of patient's room per family request, to initiate education related to hospice philosophy, services and team approach to care.  Family verbalized understanding of information given.  Per discussion, plan is for discharge to home by private vehicle possibly 02/06/17.  Please send signed and completed DNR form home with patient/family, if code status changes.  Patient will need prescriptions for discharge comfort medications.   DME needs have been discussed, patient currently has the following equipment in the home.  rollator walker, shower bench, bedside commode and nebulizer.  Family declines any additional equipment at this time, including O2 (PRN use in hospital),  they want to assess once they get patient home.    [facility]erral [facility] aware of the above.  Completed discharge [facility] at [phone], when final.  Please notify [facility] when patient is ready to leave the unit at discharge.  Call [phone] or [phone] after 5pm.  [facility] information and contact numbers given to [facility], daughter, during this visit.  Above information shared with Steward DroneBrenda, [facility]RN.  Please call with any hospice related questions.    Thank you for this referral.  Adele BarthelAmy Evans, RN, BSN [facility] Hospital Liaison [phone]  All hospital liaisons are now on AMION.

## 2017-02-05 NOTE — Care Management Note (Signed)
Case Management Note  Patient Details  Name: Bethany Nelson MRN: [id number] Date of Birth: 01/07/1927  Subjective/Objective:  CHF                 Action/Plan: CM talked to patient's daughter for home hospice choices, [facility]; referral made as requested. Expected Discharge Date:     Possibly 02/06/2017             Expected Discharge Plan:  Home w Hospice Care  In-House Referral:   Palliative Care  Discharge planning Services  CM Consult  Post Acute Care Choice:    Choice offered to:  Adult Children Status of Service:  In process, will continue to follow  Reola MosherChandler, Kennetta Pavlovic L, RN,MHA,BSN [phone] 02/05/2017, 9:56 AM

## 2017-02-05 NOTE — Progress Notes (Signed)
   Subjective: Doing okay this AM but seems confused. Is unable to recall whether or not she ambulated yesterday. Unsure what she wants to do as far as going home versus going to a rehab facility. Would like us to talk with her daughter. Her breathing is improved. All questions and concerns addressed.   Objective: Vital signs in last 24 hours: Vitals:   02/04/17 0434 02/04/17 1222 02/04/17 2006 02/05/17 0446  BP: (!) 133/58 (!) 124/51 (!) 100/44 (!) 122/53  Pulse: (!) 54 (!) 57 (!) 57 (!) 56  Resp:  18 18 18   Temp: 97.8 F (36.6 C) 97.8 F (36.6 C) (!) 97.4 F (36.3 C) 97.9 F (36.6 C)  TempSrc: Oral Oral Oral Oral  SpO2: 98% 96% 97% 98%  Weight: 186 lb 4.6 oz (84.5 kg)   187 lb 6.3 oz (85 kg)  Height:       General: Obese female in no acute distress Pulm: Good air movement with no wheezing or crackles  CV: RRR, holosystolic murmur at the left 5th intercostal space  Abdomen: Soft with no tenderness to palpation  Extremities: LE edema continues to improve  Assessment/Plan:  Acute on Chronic HFrEF - SOB have improved over the interval  - Weight trend: 189 -> 186 -> 187  - Echocardiogram preformed on 11/27 illustrating EF of 25-30% (previously was 60-65%) with diffuse hypokinesis and G3DD and worsening mitral regurgitation.  - Continue ASA 81 mg, Metoprolol 12.5 mg BID, Atorvastatin 40 mg QD, and Imdur 30 mg QD  - Switched to PO furosemide 80 mg BID, replacing potassium. Trending Cr, K, Mg, and CO2 - Monitor strict I&O with daily weights  - Cardiology following, appreciate the recommendations - Palliative care consulted, patient requesting home hospice on discharge. Case management [facility]   - Will call daughter today to discuss discharge to SNF vs discharge home with hospice   Troponinemia  - Patient presented with elevated I-Stat troponin of 0.8, and nonspecific EKG changes including ST depression in V4-V6 and lead II  - Troponin trend:  1.34 -> 2.79 -> 3.23 -> 3.17 -> 2.46 - Heparin drip discontinued on 11/29   Acute on Chronic Kidney Disease  - CKD Stage IV  - Likely acute worsening secondary to cardiorenal syndrome  - Will continue to monitor and diurese patient   Type 2 DM - Most recent A1c 6.8  - Currently on Lantus 12 units QD with SSI  Hypertension  - Continue Metoprolol 12.5 mg BID and Imdur 30 mg QD  Hyperkalemia. Resolved   Dispo: Anticipated discharge pending clinical improvement.   Levora DredgeHelberg, Melinna Linarez, MD 02/05/2017, 4:56 AM My Pager: [phone]

## 2017-02-05 NOTE — Progress Notes (Signed)
Internal Medicine Attending:   I saw and examined the patient. I reviewed the resident's note and I agree with the resident's findings and plan as documented in the resident's note.  83 year old woman hospital day #3 with acute HFrEF due to progressive mitral valve regurgitation which is not amenable to treatment. As such, her prognosis is poor and treatment options are limited to a palliative care approach. We [facility]. We are working to arrange discharge to home with hospice services either today or tomorrow depending on her home support.

## 2017-02-06 DIAGNOSIS — I5023 Acute on chronic systolic (congestive) heart failure: Secondary | ICD-10-CM

## 2017-02-06 LAB — GLUCOSE, CAPILLARY: Glucose-Capillary: 98 mg/dL (ref 65–99)

## 2017-02-06 MED ORDER — POTASSIUM CHLORIDE ER 10 MEQ PO TBCR: 20.0000 meq | EXTENDED_RELEASE_TABLET | Freq: Every day | ORAL | 0 refills | Status: DC

## 2017-02-06 MED ORDER — FUROSEMIDE 80 MG PO TABS: 80.0000 mg | ORAL_TABLET | Freq: Two times a day (BID) | ORAL | 0 refills | Status: DC

## 2017-02-06 MED ORDER — ISOSORBIDE MONONITRATE ER 30 MG PO TB24: 30.0000 mg | ORAL_TABLET | Freq: Every day | ORAL | 0 refills | Status: DC

## 2017-02-06 MED ORDER — ATORVASTATIN CALCIUM 40 MG PO TABS: 40.0000 mg | ORAL_TABLET | Freq: Every day | ORAL | 0 refills | Status: DC

## 2017-02-06 NOTE — Care Management Important Message (Signed)
Important Message  Patient Details  Name: Bethany Nelson MRN: [id number] Date of Birth: 02/27/1927   Medicare Important Message Given:  Yes    [name] 02/06/2017, 11:59 AM

## 2017-02-06 NOTE — Progress Notes (Signed)
[facility]  RN  UPDATE:  Patient wasn't weaned from O2 yesterday and still has nasal cannula this morning.  Patient in NAD and does not endorse SOB while I'm there.  I asked if RN would wean and let me know how patient does without O2.  I will follow up.  I will order portable tank for patient for the ride home per daughter request.   Thank you,  Adele BarthelAmy Evans, RN, BSN [facility] Liaison [phone]  All hospital liaisons are now on [place].

## 2017-02-06 NOTE — Progress Notes (Addendum)
[facility] Liaison: RN   Per Hassan Rowan, [facility], daughter had changed mind and no longer wants hospice services.  I met with daughter and she advised "I just want to do everything thing I can to keep her here longer.  I want rehab, I want home health, I want everything".  I offered emotional support and advised that we are still here when she is ready.  Daughter appreciative of my visit.    I have contact [place] to pick up portable O2 tank in patient's room and have it returned to [facility].  I have cancelled order through [facility] for the O2 concentrator that was ordered for home delivery.  Referral center has been made aware.  Hassan Rowan, [facility], aware.  Per daughter request, I also advised [place] to contact chaplain services in the hospital to come see patient.    Thank you for the referral.  Edyth Gunnels, RN, BSN [facility] Liaison [phone]  All hospital liaisons are now on [place].

## 2017-02-06 NOTE — Discharge Instructions (Signed)
Thank you for allowing us to provide your care.   - We have increased you Lasix to 80 mg twice a day.   - Please continue all your other medications as prescribed.   - Your primary care doctor is aware that you are going Hospice. Please follow up with home on 12/5.   - [facility] in touch with you.

## 2017-02-06 NOTE — Progress Notes (Signed)
Chaplain enjoyed a meaningful time of listening, sharing and conversation with Ms. Bethany Nelson her daughter and daughter's good friend. All are excited about [name]'s soon coming discharge. Chaplain offered Ministry of Presence, Scripture, Prayer and space for rejoicing. Family and patient expressed their gratitude for the time spent. I am grateful to the nurse that put in the consult. - The visit was very meaningful for the patient and her village of support.

## 2017-02-06 NOTE — Progress Notes (Signed)
Progress Note  Patient Name: Bethany Nelson Date of Encounter: 02/06/2017  Primary Cardiologist: Dr. Eldridge Nelson  Subjective   Breathing stable.   Inpatient Medications    Scheduled Meds: . aspirin EC  81 mg Oral Daily  . atorvastatin  40 mg Oral Daily  . donepezil  5 mg Oral Daily  . enoxaparin (LOVENOX) injection  30 mg Subcutaneous Q24H  . escitalopram  10 mg Oral Daily  . furosemide  80 mg Oral BID  . insulin aspart  0-9 Units Subcutaneous TID WC  . insulin glargine  12 Units Subcutaneous QHS  . isosorbide mononitrate  30 mg Oral Daily  . metoprolol tartrate  12.5 mg Oral BID  . senna  1 tablet Oral BID  . sodium chloride flush  3 mL Intravenous Q12H   Continuous Infusions:  PRN Meds: acetaminophen **OR** acetaminophen, albuterol, ipratropium-albuterol, oxyCODONE   Vital Signs    Vitals:   02/05/17 2012 02/06/17 0345 02/06/17 0912 02/06/17 1231  BP: (!) 102/46 (!) 134/55 (!) 129/54 (!) 131/50  Pulse: (!) 55 (!) 55 (!) 55 (!) 54  Resp: 18 18 16 18   Temp: (!) 97.5 F (36.4 C) 97.8 F (36.6 C)  98 F (36.7 C)  TempSrc: Oral Oral  Oral  SpO2: 100% 100% 94% 95%  Weight:  184 lb 15.5 oz (83.9 kg)    Height:        Intake/Output Summary (Last 24 hours) at 02/06/2017 1304 Last data filed at 02/06/2017 1231 Gross per 24 hour  Intake 120 ml  Output 500 ml  Net -380 ml   Filed Weights   02/04/17 0434 02/05/17 0446 02/06/17 0345  Weight: 186 lb 4.6 oz (84.5 kg) 187 lb 6.3 oz (85 kg) 184 lb 15.5 oz (83.9 kg)    Telemetry    Currently not on telemetry - Personally Reviewed  ECG    N/A  Physical Exam   GEN: Chronically ill app rearing female in no acute distress.   Neck: + JVD Cardiac: RRR, no murmurs, rubs, or gallops.  Respiratory: Diminished breath sound throughout GI: Soft, nontender, non-distended  MS: No edema; No deformity. Neuro:  Nonfocal  Psych: Normal affect   Labs    Chemistry Recent Labs  Lab 02/03/17 0750 02/03/17 1016  02/04/17 0605 02/05/17 0414  NA 140  --  141 140  K 5.2*  --  4.1 3.4*  CL 107  --  111 107  CO2 21*  --  22 26  GLUCOSE 284*  --  111* 74  BUN 59*  --  69* 74*  CREATININE 2.64*  --  2.58* 2.74*  CALCIUM 9.2  --  9.0 8.5*  PROT  --  6.6  --   --   ALBUMIN  --  3.3*  --   --   AST  --  42*  --   --   ALT  --  22  --   --   ALKPHOS  --  46  --   --   BILITOT  --  1.3*  --   --   GFRNONAA 15*  --  16* 14*  GFRAA 17*  --  18* 17*  ANIONGAP 12  --  8 7     Hematology Recent Labs  Lab 02/03/17 0750 02/04/17 0605 02/05/17 0414  WBC 6.0 7.9 5.3  RBC 3.31* 2.92* 2.80*  HGB 10.3* 9.3* 8.9*  HCT 33.7* 29.0* 28.2*  MCV 101.8* 99.3 100.7*  MCH 31.1 31.8 31.8  MCHC 30.6 32.1 31.6  RDW 15.9* 15.7* 16.0*  PLT 123* 124* 109*    Cardiac Enzymes Recent Labs  Lab 02/03/17 1641 02/03/17 2309 02/04/17 0605 02/04/17 1103  TROPONINI 2.79* 3.23* 3.15* 2.46*    Recent Labs  Lab 02/03/17 0802  TROPIPOC 0.81*     BNP Recent Labs  Lab 02/03/17 1022  BNP >4,500.0*     DDimer No results for input(s): DDIMER in the last 168 hours.   Radiology    No results found.  Cardiac Studies   TTE: 02/03/17  Study Conclusions  - Left ventricle: The cavity size was normal. Wall thickness was increased in a pattern of moderate LVH. Systolic function was severely reduced. The estimated ejection fraction was in the range of 25% to 30%. Diffuse hypokinesis with regional variation. Doppler parameters are consistent with restrictive left ventricular relaxation (grade 3 diastolic dysfunction). The E/e&' ratio is >20, suggesting markedly elevated LV filling pressure. - Aortic valve: Mildly calcified leaflets. There was no stenosis. There was no regurgitation. - Mitral valve: Calcified annulus. Mildly thickened leaflets . There was moderate to severe regurgitation. - Left atrium: The atrium was moderately dilated. - Right ventricle: The cavity size was mildly  dilated. The moderator band was prominent. Systolic function was normal. - Right atrium: The atrium was at the upper limits of normal in size. - Atrial septum: No defect or patent foramen ovale was identified. - Tricuspid valve: There was moderate regurgitation. - Pulmonary arteries: PA peak pressure: 76 mm Hg (S). - Inferior vena cava: The vessel was dilated. The respirophasic diameter changes were blunted (<50%), consistent with elevated central venous pressure.  Impressions:  - Compared to a prior study in 06/2016, the LVEF has decreased from 60-65% down to 25-30%, there is grade 3 DD with very high biventricular filling pressures, moderate to severe MR, moderate LAE, mild RVE and upper normal RA size, moderate TR, and severe pulmonary hypertension with an RVSP of 76 mmHg.  Patient Profile     83 y.o. female with PMH of diastolic HF, HTN, CKD, DM and HL who presented with dyspnea and found to have elevated BNP and pleural effusions.  Assessment & Plan    1. Acute on chronic diastolic RU:[id number]:Presented with worsening shortness of breath over the past several days. Weight per daughter have been stable, but patient has not been eating and drinking well.  - BNP >4500, CXR with bilateral pleural effusions. Reports being compliant with home lasix.  --Inaccurate I & O with several episodes of incontinence. Lost 3 lb since yesterday. Continue current dose of lasix add Kdur 20meq qd. Needs close follow up on renal functions.   2. Elevated Troponin:Peaked at 3.23 now trending down. Continues to deny any chest pain.   Continue ASA and statin.   3. Severe MR/pulmonary BJY:NWGNFHTN:Noted on echo, with PA pressure of 76 mmHg. Not a candidate for invasive therapy.    4. AKI on CKD:Cr 2.64>>2.58>>2.74 -- No BMEt today. Follow closely.   5. Hypokalemia: Replete per primary   Likely discharge on HH.      For questions or updates, please contact [facility] Please  consult [email or web address] for contact info under Cardiology/STEMI.      SignedSharrell Nelson, Bethany Madera RanchosBhagat, PA  02/06/2017, 1:04 PM    I have examined the patient and reviewed assessment and plan and discussed with patient.  Agree with above as stated.  Lungs clearer today. Looks well sitting up.  Continue Lasix with attention to CRI.  Bethany MccreedyBarbara prefers Home  Health rather than Hospice. Low salt diet recommended. Will follow in the office. OK to discharge today.  Family will have to decide on how aggressive to be with care going forward.   Lance MussJayadeep Sun Wilensky

## 2017-02-06 NOTE — Progress Notes (Signed)
   Subjective: Doing well this AM. Breathing has improved and she is comfortable off oxygen. Daughter is at bedside and they are ready to go home. They express that they do not need anything further from us and are ready for discharge. We discussed that we have contacted her PCP and he is aware of her decision. All questions and concerns addressed.   Objective:  Vital signs in last 24 hours: Vitals:   02/05/17 1011 02/05/17 1155 02/05/17 2012 02/06/17 0345  BP: (!) 116/47 (!) 107/45 (!) 102/46 (!) 134/55  Pulse: 63 (!) 57 (!) 55 (!) 55  Resp:  18 18 18   Temp:  98 F (36.7 C) (!) 97.5 F (36.4 C) 97.8 F (36.6 C)  TempSrc:  Oral Oral Oral  SpO2: 99% 100% 100% 100%  Weight:    184 lb 15.5 oz (83.9 kg)  Height:       General: Obese female in no acute distress Pulm: Good air movement with no wheezing  CV: RRR, holosystolic murmur at the left 5th intercostal space Abdomen: Soft with no tenderness to palpation  Extremities: LE edema continues to improve with compression stockings   Assessment/Plan:  Ms. Bethany CecilBurton is a 83 y.o female with a known history of mitral regurgitation and HFpEF who presented with progressive signs and symptoms of volume overload secondary to acute on chronic heart failure exacerbation.   Acute on Chronic HFrEF. Ms. Bethany CecilBurton presented with signs and symptoms of acute volume overload secondary to acute of chronic heart failure exacerbation. Her symptoms have since resolved. Echocardiogram was obtained that illustrate an EF of 25-30% with diffuse hypokinesis and G3DD felt to be secondary to worsening mitral regurgitation. Cardiology was consulted who recommended maximizing medical management and consulting palliative care as the patient was not a canidiate for mitral valve repair and her prognosis is poor. She has responded well to PO furosemide 80 mg BID and her weight has decreased 5 lbs since admission. She will be discharged on Furosemide 80 mg BID, ASA 81 mg, Metoprolol  12.5 mg BID, Atorvastatin 40 mg QD, and Imdur 30 mg QD. [facility] her once she returns home. Her PCP has been notified.  - Discharge home with hospice today   Troponinemia. Ms. Bethany CecilBurton presented with a I-State troponin of 0.8 and nonspecific EKG changes including ST depression in leads V4-V6 and II. Her troponin trend peaked at 3.23 while she was on a heparin drip. She had not had a prior cath and given her significant comorbidies and poor prognosis cardiac cath was not pursued. Heparin drip was discontinued on 11/29. - Troponin trend: 1.34 -> 2.79 -> 3.23-> 3.17 -> 2.46  Acute on Chronic Kidney Disease. The patient has known CKD Stage IV and presented with an acute drop in her GFR. If was felt to be secondary to her acute heart failure exacerbation and likely a cardiorenal syndrome. She was managed with diureses and close monitoring of her renal function.  - BMP trend daily.   Type 2 DM. Well controlled with her most recent A1c being 6.8. She was managed with Lantus 12 units QHS and SSI. She will restart her home diabetes regimen on discharge.  - Currently on Lantus 12 units QD with SSI  Hypertension  - Continue Metoprolol 12.5 mg BID and Imdur 30 mg QD  Dispo: Anticipated discharge in approximately 0 day(s).   Levora DredgeHelberg, Bethany Felicetti, MD 02/06/2017, 5:24 AM My Pager: [phone]

## 2017-02-06 NOTE — Progress Notes (Signed)
Physical Therapy Treatment Patient Details Name: Bethany OhmsMargie M Nelson MRN: [id number] DOB: 11/01/1926 Today's Date: 02/06/2017    History of Present Illness Pt is an 83 y/o female admitted secondary to dyspnea. Pt found to have acute on chronic HF with elevated troponins. Pt also found to have type II NSTEMI due to demand perfusion mismatch. PMH including but not limited to CHF, dementia, DM and HTN.   PT Comments    [facility] (declining SNF and Hospice), so today's session focused on assessing mobility and equipment needs. Pt requires min guard for transfers and ambulation. Limited amb distance secondary to decreased activity tolerance, pt is quick to fatigue; based on this, recommend w/c for longer distances. Educated pt and daughter on importance of continued mobility and home, including working with HHPT services. Daughter present throughout session and very supportive. Recommend 24/7 supervision at home. Will continue to follow acutely if pt remains admitted.    Follow Up Recommendations  Home health PT;Supervision/Assistance - 24 hour(refusing SNF; [facility])     Equipment Recommendations  Wheelchair (measurements PT);Wheelchair cushion (measurements PT)    Recommendations for Other Services       Precautions / Restrictions Precautions Precautions: Fall Restrictions Weight Bearing Restrictions: No    Mobility  Bed Mobility Overal bed mobility: Needs Assistance Bed Mobility: Supine to Sit     Supine to sit: Min guard     General bed mobility comments: increased time and effort, but no physical assist; cues for technique with bed flat and no hand rails  Transfers Overall transfer level: Needs assistance Equipment used: Rolling walker (2 wheeled) Transfers: Sit to/from Stand Sit to Stand: Min guard         General transfer comment: Able to stand from bed and w/c with increased effort, but no physical assist  required  Ambulation/Gait Ambulation/Gait assistance: Min guard Ambulation Distance (Feet): 100 Feet Assistive device: Rolling walker (2 wheeled) Gait Pattern/deviations: Step-through pattern;Decreased stride length;Trunk flexed Gait velocity: Decreased Gait velocity interpretation: <1.8 ft/sec, indicative of risk for recurrent falls General Gait Details: Able to amb with RW and intermittent min guard for balance; slow, controlled gait. 1x seated rest break in w/c secondary to DOE and increased fatigue   Stairs            Wheelchair Mobility    Modified Rankin (Stroke Patients Only)       Balance Overall balance assessment: Needs assistance Sitting-balance support: Feet supported Sitting balance-Leahy Scale: Good     Standing balance support: During functional activity;Bilateral upper extremity supported Standing balance-Leahy Scale: Poor Standing balance comment: reliant on bilateral UEs on RW                            Cognition Arousal/Alertness: Awake/alert Behavior During Therapy: Flat affect Overall Cognitive Status: Impaired/Different from baseline Area of Impairment: Memory                     Memory: Decreased short-term memory         General Comments: Pt pleasant and cooperative throughout session      Exercises      General Comments General comments (skin integrity, edema, etc.): Daughter present throughout session      Pertinent Vitals/Pain Pain Assessment: No/denies pain    Home Living  Prior Function            PT Goals (current goals can now be found in the care plan section) Acute Rehab PT Goals Patient Stated Goal: return home PT Goal Formulation: With patient Time For Goal Achievement: 02/18/17 Potential to Achieve Goals: Good Progress towards PT goals: Progressing toward goals    Frequency    Min 3X/week      PT Plan Discharge plan needs to be updated     Co-evaluation              AM-PAC PT "6 Clicks" Daily Activity  Outcome Measure  Difficulty turning over in bed (including adjusting bedclothes, sheets and blankets)?: A Little Difficulty moving from lying on back to sitting on the side of the bed? : A Little Difficulty sitting down on and standing up from a chair with arms (e.g., wheelchair, bedside commode, etc,.)?: A Little Help needed moving to and from a bed to chair (including a wheelchair)?: A Little Help needed walking in hospital room?: A Little Help needed climbing 3-5 steps with a railing? : A Lot 6 Click Score: 17    End of Session Equipment Utilized During Treatment: Gait belt Activity Tolerance: Patient tolerated treatment well Patient left: in chair;with call bell/phone within reach;with family/visitor present Nurse Communication: Mobility status PT Visit Diagnosis: Other abnormalities of gait and mobility (R26.89)     Time: [id number] PT Time Calculation (min) (ACUTE ONLY): 32 min  Charges:  $Gait Training: 8-22 mins $Therapeutic Exercise: 8-22 mins                    G Codes:      Ina HomesJaclyn Saleena Tamas, PT, DPT Acute Rehab Services  Pager: [phone]  Malachy ChamberJaclyn L Jermany Sundell 02/06/2017, 12:31 PM

## 2017-02-06 NOTE — Care Management Note (Signed)
Case Management Note  Patient Details  Name: Bethany OhmsMargie M Matters MRN: [id number] Date of Birth: 08/07/1926 Action/Plan: Daughter in tears stating "I cant do hospice, it's too early." Lots of emotional support. Daughter [name] had her daughter on speaker phone; they do not want home hospice care and want home with home health care services. HHC choice offered, [name]; [facility]. Attending MD made aware.  Expected Discharge Date:   02/06/2017               Expected Discharge Plan:  Home w Hospice Care  In-House Referral:   Chaplain  Discharge planning Services  CM Consult  Choice offered to:  Adult Children  HH Arranged:  RN, PT, OT, Nurse's Aide, Social Work [facility]:  [facility]  Status of Service:  In process, will continue to follow  Reola MosherChandler, Rayshell Goecke L, RN,MHA,BSN [phone] 02/06/2017, 10:11 AM

## 2017-02-06 NOTE — Discharge Summary (Signed)
Name: Bethany Nelson MRN: [id number] DOB: 12/01/1926 83 y.o. PCP: Bethany MinaHedrick, James, MD  Date of Admission: 02/03/2017  7:58 AM Date of Discharge: 02/06/2017 Attending Physician: Bethany Nelson, Bethany Nelson, *  Discharge Diagnosis: 1. Acute Exacerbation, Heart Failure Reduced Ejection Fraction  2. Troponinemia  3. CKD Stage IV  Principal Problem:   Heart failure with reduced ejection fraction (HCC) Active Problems:   Hypertension   Type 2 diabetes mellitus with stage 3 chronic kidney disease, with long-term current use of insulin (HCC)   CKD (chronic kidney disease), stage IV (HCC)   Elevated troponin   Palliative care by specialist   Advance care planning   Mitral valve disorder   Decreased urine output  Discharge Medications: Allergies as of 02/06/2017   No Known Allergies     Medication List    TAKE these medications   aspirin EC 81 MG tablet Take 81 mg by mouth every other day.   atorvastatin 40 MG tablet Commonly known as:  LIPITOR Take 1 tablet (40 mg total) by mouth daily.   cetirizine 10 MG tablet Commonly known as:  ZYRTEC Take 10 mg by mouth daily as needed for allergies.   cromolyn 4 % ophthalmic solution Commonly known as:  OPTICROM Place 1 drop into both eyes 4 (four) times daily.   donepezil 5 MG tablet Commonly known as:  ARICEPT Take 5 mg by mouth daily.   dorzolamide-timolol 22.3-6.8 MG/ML ophthalmic solution Commonly known as:  COSOPT Place 1 drop into both eyes 2 (two) times daily.   fluticasone 50 MCG/ACT nasal spray Commonly known as:  FLONASE Place 1 spray into both nostrils daily as needed for allergies.   furosemide 80 MG tablet Commonly known as:  LASIX Take 1 tablet (80 mg total) by mouth 2 (two) times daily. What changed:    medication strength  how much to take  when to take this  reasons to take this   ipratropium-albuterol 0.5-2.5 (3) MG/3ML Soln Commonly known as:  DUONEB Take 3 mLs by nebulization every 4 (four)  hours as needed for wheezing.   isosorbide mononitrate 30 MG 24 hr tablet Commonly known as:  IMDUR Take 1 tablet (30 mg total) by mouth daily.   LANTUS SOLOSTAR 100 UNIT/ML Solostar Pen Generic drug:  Insulin Glargine Inject 16 Units into the skin every morning.   latanoprost 0.005 % ophthalmic solution Commonly known as:  XALATAN Place 1 drop into both eyes at bedtime.   LEXAPRO 10 MG tablet Generic drug:  escitalopram Take 10 mg by mouth daily.   losartan 100 MG tablet Commonly known as:  COZAAR Take 100 mg by mouth daily.   metoprolol tartrate 25 MG tablet Commonly known as:  LOPRESSOR Take 12.5 mg by mouth 2 (two) times daily.   pioglitazone 15 MG tablet Commonly known as:  ACTOS Take 15 mg by mouth daily.   Potassium Chloride ER 20 MEQ Tbcr Take 20 mEq by mouth daily. TAKE EXTRA TABLET WITH FLUID TABLET  IF GAIN  3 LBS IN 24 HOURS 5 LBS  IN A WEEK   PROVENTIL HFA 108 (90 Base) MCG/ACT inhaler Generic drug:  albuterol Inhale 1 puff into the lungs every 4 (four) hours as needed for wheezing.      Disposition and follow-up:   [facility].  At the Nelson follow up visit please address:  1.  HFrEF. Ensure home health was able to make it out to  assist the patient. Discuss whether she has increase her Lasix to 80 mg twice daily. Discuss whether she has continued her potassium supplementation. Continue to discuss goals of care with the patient and her family.   2.  Labs / imaging needed at time of follow-up: BMP, to check the patient's potassium and creatinine  3.  Pending labs/ test needing follow-up: None  Follow-up Appointments: Follow-up Information    Bethany Mina, MD Follow up on 02/11/2017.   Specialty:  Family Medicine Why:  @10 :00 Contact information: [address] [facility] [place] [address] [phone]        [facility] - Dme Follow up.     Why:  They will do your home health care at your home Contact information: [address] [place] [address] [phone]          Nelson Course by problem list: Principal Problem:   Heart failure with reduced ejection fraction (HCC) Active Problems:   Hypertension   Type 2 diabetes mellitus with stage 3 chronic kidney disease, with long-term current use of insulin (HCC)   CKD (chronic kidney disease), stage IV (HCC)   Elevated troponin   Palliative care by specialist   Advance care planning   Mitral valve disorder   Decreased urine output   1. Acute Exacerbation, Heart Failure Reduced Ejection Fraction. Bethany Nelson presented with signs and symptoms of acute volume overload secondary to acute of chronic heart failure exacerbation. Her symptoms have since resolved. Echocardiogram was obtained that illustrate an EF of 25-30% with diffuse hypokinesis and G3DD felt to be secondary to worsening mitral regurgitation. Cardiology was consulted who recommended maximizing medical management and consulting palliative care as the patient was not a canidiate for mitral valve repair and her prognosis is poor. She has responded well to PO furosemide 80 mg BID and her weight has decreased 5 lbs since admission. She will be discharged on Furosemide 80 mg BID, ASA 81 mg, Metoprolol 12.5 mg BID, Atorvastatin 40 mg QD, Imdur 30 mg QD, and potassium supplementation. Ultimately family declined home hospice and asked for home health to be available. She will need a BMP at her outpatient follow-up.    2. Troponinemia. Bethany Nelson presented with a I-State troponin of 0.8 and nonspecific EKG changes including ST depression in leads V4-V6 and II. Her troponin trend peaked at 3.23 while she was on a heparin drip. She had not had a prior cath and given her significant comorbidies and poor prognosis cardiac cath was not pursued. Heparin drip was discontinued on 11/29.   3. CKD Stage IV. The patient has known  CKD Stage IV and presented with an acute drop in her GFR. If was felt to be secondary to her acute heart failure exacerbation and likely a cardiorenal syndrome. She was managed with diureses and close monitoring of her renal function.   Discharge Vitals:   BP (!) 131/50 (BP Location: Right Arm)   Pulse (!) 54   Temp 98 F (36.7 C) (Oral)   Resp 18   Ht 5\' 4"  (1.626 m)   Wt 184 lb 15.5 oz (83.9 kg)   SpO2 95%   BMI 31.75 kg/m   Pertinent Labs, Studies, and Procedures:   2D Echocardiogram  Compared to a prior study in 06/2016, the LVEF has decreased from 60-65% down to 25-30%, there is grade 3 DD with very high biventricular filling pressures, moderate to severe MR, moderate LAE, mild RVE and upper normal RA  size, moderate TR, and severe pulmonary hypertension with an RVSP of 76 mmHg.  Discharge Instructions: Discharge Instructions    Diet - low sodium heart healthy   Complete by:  As directed    Increase activity slowly   Complete by:  As directed      Signed: Levora DredgeHelberg, Adelynn Gipe, MD 02/06/2017, 1:51 PM   My Pager: [phone]

## 2017-02-08 LAB — CULTURE, BLOOD (ROUTINE X 2)
CULTURE: NO GROWTH
Culture: NO GROWTH
Special Requests: ADEQUATE
Special Requests: ADEQUATE

## 2017-02-24 ENCOUNTER — Other Ambulatory Visit: Payer: Self-pay | Admitting: Internal Medicine

## 2017-03-04 ENCOUNTER — Other Ambulatory Visit: Payer: Self-pay | Admitting: Internal Medicine

## 2017-03-09 ENCOUNTER — Encounter (HOSPITAL_COMMUNITY): Payer: Self-pay

## 2017-03-09 ENCOUNTER — Inpatient Hospital Stay (HOSPITAL_COMMUNITY)
Admission: EM | Admit: 2017-03-09 | Discharge: 2017-03-14 | DRG: 291 | Disposition: A | Discharge status: Home Health | Payer: Medicare PPO | Attending: Internal Medicine | Admitting: Internal Medicine

## 2017-03-09 ENCOUNTER — Emergency Department (HOSPITAL_COMMUNITY): Payer: Medicare PPO

## 2017-03-09 ENCOUNTER — Other Ambulatory Visit: Payer: Self-pay

## 2017-03-09 DIAGNOSIS — Z79899 Other long term (current) drug therapy: Secondary | ICD-10-CM | POA: Diagnosis not present

## 2017-03-09 DIAGNOSIS — E785 Hyperlipidemia, unspecified: Secondary | ICD-10-CM | POA: Diagnosis present

## 2017-03-09 DIAGNOSIS — R627 Adult failure to thrive: Secondary | ICD-10-CM | POA: Diagnosis present

## 2017-03-09 DIAGNOSIS — I130 Hypertensive heart and chronic kidney disease with heart failure and stage 1 through stage 4 chronic kidney disease, or unspecified chronic kidney disease: Secondary | ICD-10-CM | POA: Diagnosis not present

## 2017-03-09 DIAGNOSIS — I509 Heart failure, unspecified: Secondary | ICD-10-CM

## 2017-03-09 DIAGNOSIS — J181 Lobar pneumonia, unspecified organism: Secondary | ICD-10-CM | POA: Diagnosis present

## 2017-03-09 DIAGNOSIS — J208 Acute bronchitis due to other specified organisms: Secondary | ICD-10-CM | POA: Diagnosis not present

## 2017-03-09 DIAGNOSIS — B974 Respiratory syncytial virus as the cause of diseases classified elsewhere: Secondary | ICD-10-CM | POA: Diagnosis present

## 2017-03-09 DIAGNOSIS — Y95 Nosocomial condition: Secondary | ICD-10-CM | POA: Diagnosis present

## 2017-03-09 DIAGNOSIS — N184 Chronic kidney disease, stage 4 (severe): Secondary | ICD-10-CM | POA: Diagnosis not present

## 2017-03-09 DIAGNOSIS — I248 Other forms of acute ischemic heart disease: Secondary | ICD-10-CM | POA: Diagnosis present

## 2017-03-09 DIAGNOSIS — E119 Type 2 diabetes mellitus without complications: Secondary | ICD-10-CM

## 2017-03-09 DIAGNOSIS — I340 Nonrheumatic mitral (valve) insufficiency: Secondary | ICD-10-CM | POA: Diagnosis not present

## 2017-03-09 DIAGNOSIS — Z794 Long term (current) use of insulin: Secondary | ICD-10-CM | POA: Diagnosis not present

## 2017-03-09 DIAGNOSIS — E1165 Type 2 diabetes mellitus with hyperglycemia: Secondary | ICD-10-CM | POA: Diagnosis present

## 2017-03-09 DIAGNOSIS — I5084 End stage heart failure: Secondary | ICD-10-CM | POA: Diagnosis present

## 2017-03-09 DIAGNOSIS — F0390 Unspecified dementia without behavioral disturbance: Secondary | ICD-10-CM | POA: Diagnosis present

## 2017-03-09 DIAGNOSIS — I272 Pulmonary hypertension, unspecified: Secondary | ICD-10-CM | POA: Diagnosis not present

## 2017-03-09 DIAGNOSIS — I5043 Acute on chronic combined systolic (congestive) and diastolic (congestive) heart failure: Secondary | ICD-10-CM | POA: Diagnosis not present

## 2017-03-09 DIAGNOSIS — Z515 Encounter for palliative care: Secondary | ICD-10-CM | POA: Diagnosis not present

## 2017-03-09 DIAGNOSIS — J189 Pneumonia, unspecified organism: Secondary | ICD-10-CM | POA: Diagnosis not present

## 2017-03-09 DIAGNOSIS — D638 Anemia in other chronic diseases classified elsewhere: Secondary | ICD-10-CM | POA: Diagnosis present

## 2017-03-09 DIAGNOSIS — D696 Thrombocytopenia, unspecified: Secondary | ICD-10-CM | POA: Diagnosis present

## 2017-03-09 DIAGNOSIS — J440 Chronic obstructive pulmonary disease with acute lower respiratory infection: Secondary | ICD-10-CM | POA: Diagnosis present

## 2017-03-09 DIAGNOSIS — I081 Rheumatic disorders of both mitral and tricuspid valves: Secondary | ICD-10-CM | POA: Diagnosis present

## 2017-03-09 DIAGNOSIS — J9600 Acute respiratory failure, unspecified whether with hypoxia or hypercapnia: Secondary | ICD-10-CM

## 2017-03-09 DIAGNOSIS — Z79890 Hormone replacement therapy (postmenopausal): Secondary | ICD-10-CM | POA: Diagnosis not present

## 2017-03-09 DIAGNOSIS — J209 Acute bronchitis, unspecified: Secondary | ICD-10-CM | POA: Diagnosis present

## 2017-03-09 DIAGNOSIS — Z7982 Long term (current) use of aspirin: Secondary | ICD-10-CM | POA: Diagnosis not present

## 2017-03-09 DIAGNOSIS — J9601 Acute respiratory failure with hypoxia: Secondary | ICD-10-CM | POA: Diagnosis present

## 2017-03-09 DIAGNOSIS — R0603 Acute respiratory distress: Secondary | ICD-10-CM | POA: Diagnosis not present

## 2017-03-09 DIAGNOSIS — N179 Acute kidney failure, unspecified: Secondary | ICD-10-CM | POA: Diagnosis not present

## 2017-03-09 DIAGNOSIS — E1122 Type 2 diabetes mellitus with diabetic chronic kidney disease: Secondary | ICD-10-CM | POA: Diagnosis present

## 2017-03-09 DIAGNOSIS — R0602 Shortness of breath: Secondary | ICD-10-CM | POA: Diagnosis not present

## 2017-03-09 DIAGNOSIS — N189 Chronic kidney disease, unspecified: Secondary | ICD-10-CM | POA: Diagnosis not present

## 2017-03-09 HISTORY — DX: Pneumonia, unspecified organism: J18.9

## 2017-03-09 HISTORY — DX: Chronic kidney disease, stage 4 (severe): N18.4

## 2017-03-09 LAB — CBC WITH DIFFERENTIAL/PLATELET
BAND NEUTROPHILS: 0 %
BASOS ABS: 0.1 10*3/uL (ref 0.0–0.1)
BLASTS: 0 %
Basophils Relative: 1 %
EOS ABS: 0 10*3/uL (ref 0.0–0.7)
Eosinophils Relative: 0 %
HEMATOCRIT: 33.5 % — AB (ref 36.0–46.0)
HEMOGLOBIN: 10.3 g/dL — AB (ref 12.0–15.0)
LYMPHS PCT: 13 %
Lymphs Abs: 0.8 10*3/uL (ref 0.7–4.0)
MCH: 30.7 pg (ref 26.0–34.0)
MCHC: 30.7 g/dL (ref 30.0–36.0)
MCV: 100 fL (ref 78.0–100.0)
MYELOCYTES: 0 %
Metamyelocytes Relative: 0 %
Monocytes Absolute: 0.1 10*3/uL (ref 0.1–1.0)
Monocytes Relative: 2 %
Neutro Abs: 5 10*3/uL (ref 1.7–7.7)
Neutrophils Relative %: 84 %
OTHER: 0 %
PROMYELOCYTES ABS: 0 %
Platelets: 105 10*3/uL — ABNORMAL LOW (ref 150–400)
RBC: 3.35 MIL/uL — ABNORMAL LOW (ref 3.87–5.11)
RDW: 15.6 % — ABNORMAL HIGH (ref 11.5–15.5)
WBC: 6 10*3/uL (ref 4.0–10.5)
nRBC: 0 /100 WBC

## 2017-03-09 LAB — COMPREHENSIVE METABOLIC PANEL
ALT: 25 U/L (ref 14–54)
ANION GAP: 12 (ref 5–15)
AST: 37 U/L (ref 15–41)
Albumin: 3.1 g/dL — ABNORMAL LOW (ref 3.5–5.0)
Alkaline Phosphatase: 40 U/L (ref 38–126)
BILIRUBIN TOTAL: 0.9 mg/dL (ref 0.3–1.2)
BUN: 76 mg/dL — AB (ref 6–20)
CALCIUM: 9 mg/dL (ref 8.9–10.3)
CO2: 23 mmol/L (ref 22–32)
Chloride: 101 mmol/L (ref 101–111)
Creatinine, Ser: 2.9 mg/dL — ABNORMAL HIGH (ref 0.44–1.00)
GFR calc Af Amer: 16 mL/min — ABNORMAL LOW (ref >60)
GFR, EST NON AFRICAN AMERICAN: 13 mL/min — AB (ref >60)
Glucose, Bld: 280 mg/dL — ABNORMAL HIGH (ref 65–99)
POTASSIUM: 4.5 mmol/L (ref 3.5–5.1)
Sodium: 136 mmol/L (ref 135–145)
TOTAL PROTEIN: 6.4 g/dL — AB (ref 6.5–8.1)

## 2017-03-09 LAB — RESPIRATORY PANEL BY PCR
Adenovirus: NOT DETECTED
Bordetella pertussis: NOT DETECTED
CORONAVIRUS 229E-RVPPCR: DETECTED — AB
CORONAVIRUS HKU1-RVPPCR: NOT DETECTED
CORONAVIRUS OC43-RVPPCR: NOT DETECTED
Chlamydophila pneumoniae: NOT DETECTED
Coronavirus NL63: NOT DETECTED
Influenza A: NOT DETECTED
Influenza B: NOT DETECTED
METAPNEUMOVIRUS-RVPPCR: NOT DETECTED
Mycoplasma pneumoniae: NOT DETECTED
PARAINFLUENZA VIRUS 1-RVPPCR: NOT DETECTED
PARAINFLUENZA VIRUS 2-RVPPCR: NOT DETECTED
PARAINFLUENZA VIRUS 3-RVPPCR: NOT DETECTED
Parainfluenza Virus 4: NOT DETECTED
RESPIRATORY SYNCYTIAL VIRUS-RVPPCR: NOT DETECTED
RHINOVIRUS / ENTEROVIRUS - RVPPCR: NOT DETECTED

## 2017-03-09 LAB — TROPONIN I: TROPONIN I: 0.26 ng/mL — AB (ref <0.03)

## 2017-03-09 LAB — GLUCOSE, CAPILLARY
GLUCOSE-CAPILLARY: 156 mg/dL — AB (ref 65–99)
Glucose-Capillary: 246 mg/dL — ABNORMAL HIGH (ref 65–99)

## 2017-03-09 LAB — BRAIN NATRIURETIC PEPTIDE: B NATRIURETIC PEPTIDE 5: 4093.2 pg/mL — AB (ref 0.0–100.0)

## 2017-03-09 LAB — MAGNESIUM: MAGNESIUM: 2.6 mg/dL — AB (ref 1.7–2.4)

## 2017-03-09 LAB — TSH: TSH: 3.795 u[IU]/mL (ref 0.350–4.500)

## 2017-03-09 LAB — I-STAT CG4 LACTIC ACID, ED: LACTIC ACID, VENOUS: 1.53 mmol/L (ref 0.5–1.9)

## 2017-03-09 LAB — PROCALCITONIN: Procalcitonin: <0.1 ng/mL

## 2017-03-09 MED ORDER — ALBUTEROL SULFATE (2.5 MG/3ML) 0.083% IN NEBU
5.0000 mg | INHALATION_SOLUTION | Freq: Once | RESPIRATORY_TRACT | Status: AC
  Administered 2017-03-09: 5 mg via RESPIRATORY_TRACT
  Filled 2017-03-09: qty 6

## 2017-03-09 MED ORDER — ACETAMINOPHEN 325 MG PO TABS
650.0000 mg | ORAL_TABLET | Freq: Four times a day (QID) | ORAL | Status: DC | PRN
  Administered 2017-03-13: 650 mg via ORAL
  Filled 2017-03-09: qty 2

## 2017-03-09 MED ORDER — INSULIN ASPART 100 UNIT/ML ~~LOC~~ SOLN
0.0000 [IU] | Freq: Three times a day (TID) | SUBCUTANEOUS | Status: DC
  Administered 2017-03-09: 3 [IU] via SUBCUTANEOUS
  Administered 2017-03-11: 7 [IU] via SUBCUTANEOUS

## 2017-03-09 MED ORDER — DEXTROSE 5 % IV SOLN
1.0000 g | Freq: Once | INTRAVENOUS | Status: AC
  Administered 2017-03-09: 1 g via INTRAVENOUS
  Filled 2017-03-09: qty 10

## 2017-03-09 MED ORDER — INSULIN GLARGINE 100 UNIT/ML ~~LOC~~ SOLN
16.0000 [IU] | SUBCUTANEOUS | Status: DC
  Administered 2017-03-11: 16 [IU] via SUBCUTANEOUS
  Filled 2017-03-09 (×2): qty 0.16

## 2017-03-09 MED ORDER — HYDROXYZINE HCL 25 MG PO TABS: 25.0000 mg | ORAL_TABLET | Freq: Three times a day (TID) | ORAL | Status: DC | PRN

## 2017-03-09 MED ORDER — FUROSEMIDE 10 MG/ML IJ SOLN
40.0000 mg | Freq: Once | INTRAMUSCULAR | Status: AC
  Administered 2017-03-09: 40 mg via INTRAVENOUS
  Filled 2017-03-09: qty 4

## 2017-03-09 MED ORDER — NEPRO/CARBSTEADY PO LIQD
237.0000 mL | Freq: Three times a day (TID) | ORAL | Status: DC | PRN
  Filled 2017-03-09: qty 237

## 2017-03-09 MED ORDER — ONDANSETRON HCL 4 MG/2ML IJ SOLN: 4.0000 mg | Freq: Four times a day (QID) | INTRAMUSCULAR | Status: DC | PRN

## 2017-03-09 MED ORDER — ONDANSETRON HCL 4 MG PO TABS: 4.0000 mg | ORAL_TABLET | Freq: Four times a day (QID) | ORAL | Status: DC | PRN

## 2017-03-09 MED ORDER — ASPIRIN 81 MG PO CHEW
324.0000 mg | CHEWABLE_TABLET | Freq: Once | ORAL | Status: AC
  Administered 2017-03-09: 324 mg via ORAL
  Filled 2017-03-09: qty 4

## 2017-03-09 MED ORDER — ONDANSETRON HCL 4 MG/2ML IJ SOLN
4.0000 mg | Freq: Four times a day (QID) | INTRAMUSCULAR | Status: DC | PRN
Start: 2017-03-09 — End: 2017-03-09

## 2017-03-09 MED ORDER — DONEPEZIL HCL 5 MG PO TABS
5.0000 mg | ORAL_TABLET | Freq: Every day | ORAL | Status: DC
  Administered 2017-03-09 – 2017-03-14 (×6): 5 mg via ORAL
  Filled 2017-03-09 (×6): qty 1

## 2017-03-09 MED ORDER — ASPIRIN EC 81 MG PO TBEC
81.0000 mg | DELAYED_RELEASE_TABLET | ORAL | Status: DC
  Administered 2017-03-11 – 2017-03-13 (×2): 81 mg via ORAL
  Filled 2017-03-09 (×4): qty 1

## 2017-03-09 MED ORDER — INSULIN ASPART 100 UNIT/ML ~~LOC~~ SOLN
0.0000 [IU] | Freq: Every day | SUBCUTANEOUS | Status: DC
  Administered 2017-03-10: 4 [IU] via SUBCUTANEOUS

## 2017-03-09 MED ORDER — DEXTROSE 5 % IV SOLN
1.0000 g | INTRAVENOUS | Status: AC
  Administered 2017-03-09 – 2017-03-13 (×5): 1 g via INTRAVENOUS
  Filled 2017-03-09 (×5): qty 1

## 2017-03-09 MED ORDER — LATANOPROST 0.005 % OP SOLN
1.0000 [drp] | Freq: Every day | OPHTHALMIC | Status: DC
  Administered 2017-03-09 – 2017-03-13 (×5): 1 [drp] via OPHTHALMIC
  Filled 2017-03-09: qty 2.5

## 2017-03-09 MED ORDER — LORATADINE 10 MG PO TABS
10.0000 mg | ORAL_TABLET | Freq: Every day | ORAL | Status: DC
  Administered 2017-03-09 – 2017-03-14 (×6): 10 mg via ORAL
  Filled 2017-03-09 (×6): qty 1

## 2017-03-09 MED ORDER — SODIUM CHLORIDE 0.9% FLUSH
3.0000 mL | Freq: Two times a day (BID) | INTRAVENOUS | Status: DC
  Administered 2017-03-09 – 2017-03-14 (×11): 3 mL via INTRAVENOUS

## 2017-03-09 MED ORDER — ACETAMINOPHEN 325 MG PO TABS: 650.0000 mg | ORAL_TABLET | ORAL | Status: DC | PRN

## 2017-03-09 MED ORDER — DEXTROSE 5 % IV SOLN
500.0000 mg | Freq: Once | INTRAVENOUS | Status: DC
  Administered 2017-03-09: 500 mg via INTRAVENOUS
  Filled 2017-03-09: qty 500

## 2017-03-09 MED ORDER — IPRATROPIUM-ALBUTEROL 0.5-2.5 (3) MG/3ML IN SOLN: 3.0000 mL | RESPIRATORY_TRACT | Status: DC | PRN

## 2017-03-09 MED ORDER — SODIUM CHLORIDE 0.9% FLUSH: 3.0000 mL | INTRAVENOUS | Status: DC | PRN

## 2017-03-09 MED ORDER — FUROSEMIDE 10 MG/ML IJ SOLN
80.0000 mg | Freq: Two times a day (BID) | INTRAMUSCULAR | Status: DC
  Administered 2017-03-09 – 2017-03-13 (×9): 80 mg via INTRAVENOUS
  Filled 2017-03-09 (×9): qty 8

## 2017-03-09 MED ORDER — ALBUTEROL SULFATE HFA 108 (90 BASE) MCG/ACT IN AERS: 1.0000 | INHALATION_SPRAY | RESPIRATORY_TRACT | Status: DC | PRN

## 2017-03-09 MED ORDER — HEPARIN SODIUM (PORCINE) 5000 UNIT/ML IJ SOLN
5000.0000 [IU] | Freq: Three times a day (TID) | INTRAMUSCULAR | Status: DC
  Administered 2017-03-09 – 2017-03-14 (×14): 5000 [IU] via SUBCUTANEOUS
  Filled 2017-03-09 (×14): qty 1

## 2017-03-09 MED ORDER — SODIUM CHLORIDE 0.9 % IV SOLN: 250.0000 mL | INTRAVENOUS | Status: DC | PRN

## 2017-03-09 MED ORDER — CROMOLYN SODIUM 4 % OP SOLN
1.0000 [drp] | Freq: Four times a day (QID) | OPHTHALMIC | Status: DC
  Administered 2017-03-09 – 2017-03-14 (×17): 1 [drp] via OPHTHALMIC
  Filled 2017-03-09: qty 10

## 2017-03-09 MED ORDER — DOCUSATE SODIUM 283 MG RE ENEM
1.0000 | ENEMA | RECTAL | Status: DC | PRN
Start: 2017-03-09 — End: 2017-03-14
  Filled 2017-03-09: qty 1

## 2017-03-09 MED ORDER — VANCOMYCIN HCL 10 G IV SOLR
1500.0000 mg | Freq: Once | INTRAVENOUS | Status: AC
  Administered 2017-03-09: 1500 mg via INTRAVENOUS
  Filled 2017-03-09: qty 1500

## 2017-03-09 MED ORDER — ENOXAPARIN SODIUM 40 MG/0.4ML ~~LOC~~ SOLN: 40.0000 mg | SUBCUTANEOUS | Status: DC

## 2017-03-09 MED ORDER — VANCOMYCIN HCL IN DEXTROSE 1-5 GM/200ML-% IV SOLN
1000.0000 mg | INTRAVENOUS | Status: DC
  Administered 2017-03-11: 1000 mg via INTRAVENOUS
  Filled 2017-03-09: qty 200

## 2017-03-09 MED ORDER — ATORVASTATIN CALCIUM 40 MG PO TABS
40.0000 mg | ORAL_TABLET | Freq: Every day | ORAL | Status: DC
  Administered 2017-03-10 – 2017-03-14 (×5): 40 mg via ORAL
  Filled 2017-03-09 (×5): qty 1

## 2017-03-09 MED ORDER — ACETAMINOPHEN 650 MG RE SUPP: 650.0000 mg | Freq: Four times a day (QID) | RECTAL | Status: DC | PRN

## 2017-03-09 MED ORDER — ESCITALOPRAM OXALATE 10 MG PO TABS
10.0000 mg | ORAL_TABLET | Freq: Every day | ORAL | Status: DC
  Administered 2017-03-10 – 2017-03-14 (×5): 10 mg via ORAL
  Filled 2017-03-09 (×5): qty 1

## 2017-03-09 MED ORDER — SORBITOL 70 % SOLN: 30.0000 mL | Status: DC | PRN

## 2017-03-09 MED ORDER — IPRATROPIUM-ALBUTEROL 0.5-2.5 (3) MG/3ML IN SOLN
3.0000 mL | RESPIRATORY_TRACT | Status: DC
  Administered 2017-03-09: 3 mL via RESPIRATORY_TRACT
  Filled 2017-03-09: qty 3

## 2017-03-09 MED ORDER — CALCIUM CARBONATE ANTACID 1250 MG/5ML PO SUSP
500.0000 mg | Freq: Four times a day (QID) | ORAL | Status: DC | PRN
  Filled 2017-03-09: qty 5

## 2017-03-09 MED ORDER — DORZOLAMIDE HCL-TIMOLOL MAL 2-0.5 % OP SOLN
1.0000 [drp] | Freq: Two times a day (BID) | OPHTHALMIC | Status: DC
  Administered 2017-03-09 – 2017-03-14 (×10): 1 [drp] via OPHTHALMIC
  Filled 2017-03-09: qty 10

## 2017-03-09 MED ORDER — ZOLPIDEM TARTRATE 5 MG PO TABS: 5.0000 mg | ORAL_TABLET | Freq: Every evening | ORAL | Status: DC | PRN

## 2017-03-09 MED ORDER — CAMPHOR-MENTHOL 0.5-0.5 % EX LOTN
1.0000 "application " | TOPICAL_LOTION | Freq: Three times a day (TID) | CUTANEOUS | Status: DC | PRN
  Filled 2017-03-09: qty 222

## 2017-03-09 NOTE — Progress Notes (Signed)
Pharmacy Antibiotic Note  Bethany Nelson is a 83 y.o. female admitted on 03/09/2017 with pneumonia.  Pharmacy has been consulted for vancomycin dosing. Pt is afebrile and WBC is WNL. SCr is elevated at 2.9. Lactic acid is <2.   Plan: Vancomycin 1500mg  IV x 1 then 1gm IV 48H Continue cefepime per MD - renally adjusted F/u renal fxn, C&S, clinical status and trough at SS  Height: 5\' 6"  (167.6 cm) Weight: 180 lb (81.6 kg) IBW/kg (Calculated) : 59.3  Temp (24hrs), Avg:98.2 F (36.8 C), Min:98.2 F (36.8 C), Max:98.2 F (36.8 C)  Recent Labs  Lab 03/09/17 1029 03/09/17 1043  WBC 6.0  --   CREATININE 2.90*  --   LATICACIDVEN  --  1.53    Estimated Creatinine Clearance: 14.4 mL/min (A) (by C-G formula based on SCr of 2.9 mg/dL (H)).    Allergies  Allergen Reactions  . Zithromax [Azithromycin] Other (See Comments)    Noted red enhancement of vein distal to IV insertion site after several minutes of IV drug infusion- perfect outline of vein branch- no itching or edema    Antimicrobials this admission: Vanc 12/31>> Cefepime 12/31>> Azithro x 1 12/31 CTX x 1 12/31  Dose adjustments this admission: N/A  Microbiology results: Pending  Thank you for allowing pharmacy to be a part of this patient's care.  Fantasia Jinkins, Drake LeachRachel Lynn 03/09/2017 3:03 PM

## 2017-03-09 NOTE — ED Notes (Signed)
Pt unable to state age or year. Pt's daughter at bedside states at baseline pt AOx4.

## 2017-03-09 NOTE — H&P (Signed)
History and Physical    Bethany OhmsMargie M Viveiros WCH:[id number]:[id number] DOB: 12/01/1926 DOA: 03/09/2017   PCP: Jerl MinaHedrick, James, MD   Attending physician: Mariea ClontsEmokpae  Patient coming from/Resides with: Private residence  Chief Complaint: Shortness of breath  HPI: Bethany Nelson is a 83 y.o. female with medical history significant for diabetes on insulin, dementia, stage IV chronic kidney disease.  Patient was recently diagnosed with acute on chronic systolic and diastolic heart failure in the context of severe mitral regurgitation during a recent admission last month.  She was discharged on 11/30 by the teaching service.  During that hospitalization cardiology was consulted and determined the patient was not a surgical candidate for repair of the mitral valve.  From a cardiac standpoint she was discharged on Lasix 80 mg p.o. twice daily, metoprolol and Imdur treat her mitral valve related heart failure and her underlying severe pulmonary hypertension.  Because of her underlying dementia the patient is not an adequate historian.  Patient had been in her usual state of health until overnight when she developed sudden PND and shortness of breath.  She does have chronic dyspnea on exertion which had been unchanged until today as well.  She had also apparently been having some cough without fevers or chills and on 12/38 was started on Augmentin by her family physician.  Today her daughter she was wheezing extensively.  Presented to the ER patient was markedly dyspneic, he was maintaining room air saturations of 100% (this in the context of increased work of breathing).  Chest x-ray revealed small left pleural effusion and left lower lobe airspace opacity which may represent pneumonia.  BNP markedly elevated at 4093 with an elevated troponin of 0.26 and normal lactic acid.  Patient did not have any leukocytosis but did have elevated neutrophil count.  EDP is opted to treat the patient both for possible pneumonia as well as  heart failure exacerbation.  ED Course:  Vital Signs: BP (!) 114/45 (BP Location: Left Arm)   Pulse 78   Temp 98.6 F (37 C) (Oral)   Resp (!) 28   Ht 5\' 5"  (1.651 m)   Wt 81.2 kg (179 lb)   SpO2 93%   BMI 29.79 kg/m  Chest x-ray: As above Lab data: Sodium 136, potassium 4.5, chloride 101, CO2 23, glucose 280, BUN 76, creatinine 2.9, BNP 4093, troponin 0 0.26, lactic acid 1.53, white count 6000 with neutrophils 84%, absolute neutrophils 5%, hemoglobin 10.3, platelets 105,000 Medications and treatments: Proventil neb 5 mg x1, DuoNeb x1, Rocephin 1 g IV x1, Zithromax 500 mg IV x1 although dose stopped during initial minutes of administration due to concerns over allergic reaction, aspirin 324 mg x1, Lasix 40 mg IV times  Review of Systems:  In addition to the HPI above,  No Fever-chills, myalgias or other constitutional symptoms No Headache, changes with Vision or hearing, new weakness, tingling, numbness in any extremity, dizziness, dysarthria or word finding difficulty, gait disturbance or imbalance, tremors or seizure activity No problems swallowing food or Liquids, indigestion/reflux, choking or coughing while eating, abdominal pain with or after eating No Chest pain, Cough, palpitations No Abdominal pain, N/V, melena,hematochezia, dark tarry stools, constipation No dysuria, malodorous urine, hematuria or flank pain No new skin rashes, lesions, masses or bruises, No new joint pains, aches, swelling or redness No recent unintentional weight gain or loss No polyuria, polydypsia or polyphagia   Past Medical History:  Diagnosis Date  . Chronic diastolic congestive heart failure (HCC)    a.  02/2016: echo showing normal EF, Grade 3 DD, mild MR, trivial TR  . Dementia   . Diabetes mellitus without complication (HCC)   . Hypertension     History reviewed. No pertinent surgical history.  Social History   Socioeconomic History  . Marital status: Widowed    Spouse name: Not on  file  . Number of children: Not on file  . Years of education: Not on file  . Highest education level: Not on file  Social Needs  . Financial resource strain: Not on file  . Food insecurity - worry: Not on file  . Food insecurity - inability: Not on file  . Transportation needs - medical: Not on file  . Transportation needs - non-medical: Not on file  Occupational History  . Not on file  Tobacco Use  . Smoking status: Never Smoker  . Smokeless tobacco: Never Used  Substance and Sexual Activity  . Alcohol use: No  . Drug use: No  . Sexual activity: Not on file  Other Topics Concern  . Not on file  Social History Narrative  . Not on file    Mobility: Home health PT, wheelchair with wheelchair cushion Work history: Not obtained   Allergies  Allergen Reactions  . Zithromax [Azithromycin] Other (See Comments)    Noted red enhancement of vein distal to IV insertion site after several minutes of IV drug infusion- perfect outline of vein branch- no itching or edema    Family History  Problem Relation Age of Onset  . Esophageal cancer Mother   . Heart disease Neg Hx      Prior to Admission medications   Medication Sig Start Date End Date Taking? Authorizing Provider  albuterol (PROVENTIL HFA) 108 (90 Base) MCG/ACT inhaler Inhale 1 puff into the lungs every 4 (four) hours as needed for wheezing. 08/18/16 08/18/17 Yes [provider]  amLODipine (NORVASC) 5 MG tablet Take 5 mg by mouth daily.   Yes [provider]  amoxicillin-clavulanate (AUGMENTIN) 875-125 MG tablet Take 1 tablet by mouth 2 (two) times daily. 03/07/17  Yes [provider]  aspirin EC 81 MG tablet Take 81 mg by mouth every other day.   Yes [provider]  atorvastatin (LIPITOR) 40 MG tablet Take 1 tablet (40 mg total) by mouth daily. 02/06/17  Yes Helberg, Jill Alexanders, MD  benzonatate (TESSALON) 100 MG capsule Take 100 mg by mouth 3 (three) times daily as needed for cough. 03/07/17   Yes [provider]  cetirizine (ZYRTEC) 10 MG tablet Take 10 mg by mouth daily as needed for allergies.   Yes [provider]  cromolyn (OPTICROM) 4 % ophthalmic solution Place 1 drop into both eyes 4 (four) times daily.   Yes [provider]  donepezil (ARICEPT) 5 MG tablet Take 5 mg by mouth daily. 05/13/16  Yes [provider]  dorzolamide-timolol (COSOPT) 22.3-6.8 MG/ML ophthalmic solution Place 1 drop into both eyes 2 (two) times daily. 12/16/15  Yes [provider]  escitalopram (LEXAPRO) 10 MG tablet Take 10 mg by mouth daily.    Yes [provider]  fluticasone (FLONASE) 50 MCG/ACT nasal spray Place 1 spray into both nostrils daily as needed for allergies.  02/17/16  Yes [provider]  furosemide (LASIX) 80 MG tablet Take 1 tablet (80 mg total) by mouth 2 (two) times daily. Patient taking differently: Take 80 mg by mouth daily.  02/06/17  Yes Helberg, Jill Alexanders, MD  ipratropium-albuterol (DUONEB) 0.5-2.5 (3) MG/3ML SOLN Take 3  mLs by nebulization every 4 (four) hours as needed for wheezing. 01/31/17  Yes [provider]  isosorbide mononitrate (IMDUR) 30 MG 24 hr tablet Take 1 tablet (30 mg total) by mouth daily. 02/06/17  Yes Helberg, Jill Alexanders, MD  LANTUS SOLOSTAR 100 UNIT/ML Solostar Pen Inject 16 Units into the skin every morning.  01/09/16  Yes [provider]  latanoprost (XALATAN) 0.005 % ophthalmic solution Place 1 drop into both eyes at bedtime. 02/15/16  Yes [provider]  levothyroxine (SYNTHROID, LEVOTHROID) 75 MCG tablet Take 75 mcg by mouth daily before breakfast.   Yes [provider]  losartan (COZAAR) 100 MG tablet Take 100 mg by mouth daily. 10/13/16  Yes [provider]  metoprolol tartrate (LOPRESSOR) 25 MG tablet Take 12.5 mg by mouth 2 (two) times daily.  01/09/16  Yes [provider]  pioglitazone (ACTOS) 15 MG tablet Take 15 mg by mouth daily.  07/21/16  Yes  [provider]  Potassium Chloride ER 20 MEQ TBCR Take 20 mEq by mouth daily. TAKE EXTRA TABLET WITH FLUID TABLET  IF GAIN  3 LBS IN 24 HOURS 5 LBS  IN A WEEK Patient taking differently: Take 20 mEq by mouth 2 (two) times daily. TAKE EXTRA TABLET WITH FLUID TABLET  IF GAIN  3 LBS IN 24 HOURS 5 LBS  IN A WEEK 07/16/16  Yes Dyann Kief, PA-C  valsartan (DIOVAN) 320 MG tablet Take 320 mg by mouth daily.   Yes [provider]    Physical Exam: Vitals:   03/09/17 1400 03/09/17 1430 03/09/17 1500 03/09/17 1533  BP: (!) 121/59 (!) 110/54 (!) 115/55 (!) 114/45  Pulse: 73 75 74 78  Resp: (!) 30 (!) 31 (!) 31 (!) 28  Temp:    98.6 F (37 C)  TempSrc:    Oral  SpO2: 97% 96% 100% 93%  Weight:    81.2 kg (179 lb)  Height:    5\' 5"  (1.651 m)      Constitutional: Increased work of breathing at rest, she is not anxious Eyes: PERRL, lids and conjunctivae normal ENMT: Mucous membranes are moist. Posterior pharynx clear of any exudate or lesions. Age appropriate dentition.  Neck: normal, supple, no masses, no thyromegaly Respiratory: Diffuse crackles somewhat diminished in the bases.. Abnormal respiratory effort with tachypnea and increased accessory muscle use despite no evidence of hypoxemia.  Nasal cannula oxygen applied at 4 L with subjective report of improvement in respiratory symptoms although still noted with increased work of breathing Cardiovascular: Regular rate and rhythm, no murmurs / rubs / gallops. No extremity edema. 2+ pedal pulses. No carotid bruits.  Abdomen: no tenderness, no masses palpated. No hepatosplenomegaly. Bowel sounds positive.  Musculoskeletal: no clubbing / cyanosis. No joint deformity upper and lower extremities. Good ROM, no contractures. Normal muscle tone.  Skin: no rashes, lesions, ulcers. No induration Neurologic: CN 2-12 grossly intact. Sensation intact, DTR normal. Strength 5/5 x all 4 extremities.  Psychiatric: Alert and oriented x name in  the context of underlying dementia. Normal mood.  Asked to daughter at times would needs help answering history questions   Labs on Admission: I have personally reviewed following labs and imaging studies  CBC: Recent Labs  Lab 03/09/17 1029  WBC 6.0  NEUTROABS 5.0  HGB 10.3*  HCT 33.5*  MCV 100.0  PLT 105*   Basic Metabolic Panel: Recent Labs  Lab 03/09/17 1029  NA 136  K 4.5  CL 101  CO2 23  GLUCOSE  280*  BUN 76*  CREATININE 2.90*  CALCIUM 9.0   GFR: Estimated Creatinine Clearance: 14.1 mL/min (A) (by C-G formula based on SCr of 2.9 mg/dL (H)). Liver Function Tests: Recent Labs  Lab 03/09/17 1029  AST 37  ALT 25  ALKPHOS 40  BILITOT 0.9  PROT 6.4*  ALBUMIN 3.1*   No results for input(s): LIPASE, AMYLASE in the last 168 hours. No results for input(s): AMMONIA in the last 168 hours. Coagulation Profile: No results for input(s): INR, PROTIME in the last 168 hours. Cardiac Enzymes: Recent Labs  Lab 03/09/17 1029  TROPONINI 0.26*   BNP (last 3 results) Recent Labs    07/03/16 1350  PROBNP 4,153*   HbA1C: No results for input(s): HGBA1C in the last 72 hours. CBG: No results for input(s): GLUCAP in the last 168 hours. Lipid Profile: No results for input(s): CHOL, HDL, LDLCALC, TRIG, CHOLHDL, LDLDIRECT in the last 72 hours. Thyroid Function Tests: No results for input(s): TSH, T4TOTAL, FREET4, T3FREE, THYROIDAB in the last 72 hours. Anemia Panel: No results for input(s): VITAMINB12, FOLATE, FERRITIN, TIBC, IRON, RETICCTPCT in the last 72 hours. Urine analysis:    Component Value Date/Time   COLORURINE YELLOW 02/03/2017 0903   APPEARANCEUR HAZY (A) 02/03/2017 0903   APPEARANCEUR Cloudy 05/21/2013 2205   LABSPEC 1.012 02/03/2017 0903   LABSPEC 1.012 05/21/2013 2205   PHURINE 5.0 02/03/2017 0903   GLUCOSEU NEGATIVE 02/03/2017 0903   GLUCOSEU 150 mg/dL 16/10/960403/14/2015 [id number]   HGBUR NEGATIVE 02/03/2017 0903   BILIRUBINUR NEGATIVE 02/03/2017 0903    BILIRUBINUR Negative 05/21/2013 2205   KETONESUR NEGATIVE 02/03/2017 0903   PROTEINUR NEGATIVE 02/03/2017 0903   NITRITE NEGATIVE 02/03/2017 0903   LEUKOCYTESUR TRACE (A) 02/03/2017 0903   LEUKOCYTESUR Negative 05/21/2013 2205   Sepsis Labs: @LABRCNTIP (procalcitonin:4,lacticidven:4) )No results found for this or any previous visit (from the past 240 hour(s)).   Radiological Exams on Admission: Dg Chest 2 View  Result Date: 03/09/2017 CLINICAL DATA:  Shortness of breath and cough for 2 days EXAM: CHEST  2 VIEW COMPARISON:  02/03/2017 FINDINGS: Normal heart size. Aortic atherosclerosis. Small left pleural effusion. Opacity within the posterior left lower lobe identified compatible is identified on the lateral radiograph. Diffuse chronic interstitial coarsening identified bilaterally. IMPRESSION: 1. Small left pleural effusion and left lower lobe airspace opacity which may represent pneumonia 2.  Aortic Atherosclerosis (ICD10-I70.0). Electronically Signed   By: Signa Kellaylor  Stroud M.D.   On: 03/09/2017 11:23    EKG: (Independently reviewed) sinus rhythm with first-degree AV block with underlying left bundle branch block versus nonspecific IVCD, downsloping ST segments in lateral leads which is slightly more prominent than previous EKG from April 2018  Assessment/Plan Principal Problem:   Acute on chronic combined systolic and diastolic heart failure w/ Moderate to severe pulmonary hypertension 2/2 Severe mitral regurgitation -Presents with acute dyspnea without hypoxemia, increased work of breathing, elevated BNP chest x-ray with pleural effusion concerning for possible CHF exacerbation Echocardiogram during previous admission: EF 25-30% with moderate LVH, diffuse hypokinesis, grade 3 diastolic dysfunction, moderate to severe mitral regurgitation, severe pulmonary hypertension 76 mmHg, moderate tricuspid regurgitation, normal RV systolic function -Of note cardiology evaluated the patient last month  determined her to not to be a surgical candidate and recommended palliative evaluation.  Hospice care was recommended by the resident team to the family but no formal hospice/palliative evaluation accomplished during the hospitalization and patient was sent home with home health -Patient has returned to the hospital with an exacerbation just over 30  days from previous discharge-she was not hypoxemic at rest but does have chronic DOE and may benefit from home oxygen to assist with her end-stage heart failure symptoms -Lasix milligrams IV every 12 hours-follow renal function-given low GFR she may benefit from higher dose Lasix at discharge -With focus on aggressive diuresis will minimize antihypertensive medications -Was not on ACE inhibitor/ARB to admission secondary to advanced CKD -Was not on beta-blocker prior to admission -Daily weights, strict I/O -Continue Imdur for afterload reduction-no evidence of right heart failure -Recommend formal palliative evaluation this admission to discuss goals of care-patient has end-stage heart failure and was recently admitted just over 30 days ago but has returned-I anticipate poor prognosis as outlined by cardiology during previous admission-currently patient is a full resuscitation  Active Problems:   Acute respiratory failure with hypoxemia  -Patient does not have documented hypoxemia although she has significant increased work of breathing at rest and is maintaining appropriate saturations but I expect if we were able to ambulate her her O2 saturations would drop -She has been empirically placed on 4 L of oxygen -Will eventually need to be ambulated on room air to see if her O2 saturations drop versus obtain room air ABG to determine if she is eligible for home O2 in the context of end-stage heart failure    ? HCAP (healthcare-associated pneumonia) -Patient presents with coughing, shortness of breath and wheezing and had been placed on oral  antibiotics/Augmentin in the past 24 hours by primary physician -Chest x-ray concerning for possible left infiltrate although this could also be unilateral edema -Repeat chest x-ray in a.m.-if area in left lung improved after diuresis and likely noninfectious in nature -Empiric broad-spectrum antibiotics vancomycin and Maxipime to cover possible healthcare acquired infection -Patient had unusual reaction with initial administration of Zithromax-unclear of true allergy but this medication was discontinued -Respiratory viral panel -Blood cultures -Lower respiratory tract Procalcitonin -Urinary strep -Supportive care as above    Diabetes mellitus type 2, insulin dependent -Continue preadmission Lantus -Follow CBGs -SSI -HgbA1c    CKD (chronic kidney disease) stage 4, GFR 15-29 ml/min  -GFR less than 20 -Avoid nephrotoxic medications    HTN -Hold preadmission Norvasc in the context of need for aggressive diuresis and utilization of nitrates acutely    Dementia -Continue Aricept    Dyslipidemia -Continue Lipitor      DVT prophylaxis: Subcutaneous heparin Code Status: Full Family Communication: Daughter Disposition Plan: Home Consults called: None    Joscelyn Hardrick L. ANP-BC [facility] Pager [phone]   If 7PM-7AM, please contact night-coverage [email or web address] Password [email or web address]  03/09/2017, 4:14 PM

## 2017-03-09 NOTE — ED Provider Notes (Signed)
Emergency Department Provider Note   I have reviewed the triage vital signs and the nursing notes.   HISTORY  Chief Complaint Shortness of Breath   HPI Bethany Nelson is a 83 y.o. female with a history of heart failure, dementia, diabetes, hypertension who presents to the emergency department today secondary to shortness of breath.  Her daughter gives most of the history as she lives with the patient.  She has had wheezing and congested sounding cough for a while but it seemed like it got worse yesterday.  She called her primary provider which started her on antibiotics but today got worse and she was having difficulty talking secondary to shortness of breath and brought here for evaluation.  States that she has had a productive cough but is been clear sputum and that has not really changed.  She is not really had any fevers.  She had been complaining chest pain before.  She has a known bad valve but is not a candidate for replacement.  Daughter states that she does get like this when she has heart failure exacerbations. No other associated or modifying symptoms.    Past Medical History:  Diagnosis Date  . Chronic diastolic congestive heart failure (HCC)    a. 02/2016: echo showing normal EF, Grade 3 DD, mild MR, trivial TR  . Dementia   . Diabetes mellitus without complication (HCC)   . Hypertension     Patient Active Problem List   Diagnosis Date Noted  . Acute on chronic combined systolic and diastolic heart failure (HCC) 03/09/2017  . Acute respiratory failure with hypoxemia (HCC) 03/09/2017  . Severe mitral regurgitation 03/09/2017  . Moderate to severe pulmonary hypertension (HCC) 03/09/2017  . Dementia 03/09/2017  . Diabetes mellitus type 2, insulin dependent (HCC) 03/09/2017  . CKD (chronic kidney disease) stage 4, GFR 15-29 ml/min (HCC) 03/09/2017  . HCAP (healthcare-associated pneumonia) 03/09/2017  . Palliative care by specialist   . Advance care planning   .  Mitral valve disorder   . Decreased urine output   . Dyspnea 02/03/2017  . Arthritis 07/16/2016  . Depression 07/16/2016  . Thyroid disease 07/16/2016  . Elevated troponin 06/22/2016  . Acute respiratory failure with hypoxia (HCC) 06/22/2016  . 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 Chronic diastolic heart failure (HCC) 05/12/2016  . Hyperlipidemia, unspecified 05/12/2016  . Heart failure with reduced ejection fraction (HCC) 02/21/2016  . Hypertension 02/21/2016  . Type 2 diabetes mellitus with stage 3 chronic kidney disease, with long-term current use of insulin (HCC) 02/21/2016  . CKD (chronic kidney disease), stage IV (HCC) 02/21/2016  . New onset of congestive heart failure (HCC) 02/21/2016    History reviewed. No pertinent surgical history.    Allergies Zithromax [azithromycin]  Family History  Problem Relation Age of Onset  . Esophageal cancer Mother   . Heart disease Neg Hx     Social History Social History   Tobacco Use  . Smoking status: Never Smoker  . Smokeless tobacco: Never Used  Substance Use Topics  . Alcohol use: No  . Drug use: No    Review of Systems  All other systems negative except as documented in the HPI. All pertinent positives and negatives as reviewed in the HPI. ____________________________________________   PHYSICAL EXAM:  VITAL SIGNS: ED Triage Vitals [03/09/17 1055]  Enc Vitals Group     BP Marland Kitchen)  125/51     Pulse Rate 65     Resp 20     Temp 98.2 F (36.8 C)     Temp Source Oral     SpO2 100 %     Weight 180 lb (81.6 kg)     Height 5\' 6"  (1.676 m)    Constitutional: Alert and oriented. Well appearing and in no acute distress. Eyes: Conjunctivae are normal. PERRL. EOMI. Head: Atraumatic. Nose: No congestion/rhinnorhea. Mouth/Throat: Mucous membranes are moist.  Oropharynx non-erythematous. Neck: No  stridor.  No meningeal signs.   Cardiovascular: Normal rate, regular rhythm. Good peripheral circulation. Grossly normal heart sounds.   Respiratory: tachypneic respiratory effort. Mild supraclavicular retractions. Lungs wheezing, crackles. Gastrointestinal: Soft and nontender. No distention.  Musculoskeletal: No lower extremity tenderness nor edema. No gross deformities of extremities. Neurologic:  Normal speech and language. No gross focal neurologic deficits are appreciated.  Skin:  Skin is warm, dry and intact. No rash noted.   ____________________________________________   LABS (all labs ordered are listed, but only abnormal results are displayed)  Labs Reviewed  COMPREHENSIVE METABOLIC PANEL - Abnormal; Notable for the following components:      Result Value   Glucose, Bld 280 (*)    BUN 76 (*)    Creatinine, Ser 2.90 (*)    Total Protein 6.4 (*)    Albumin 3.1 (*)    GFR calc non Af Amer 13 (*)    GFR calc Af Amer 16 (*)    All other components within normal limits  CBC WITH DIFFERENTIAL/PLATELET - Abnormal; Notable for the following components:   RBC 3.35 (*)    Hemoglobin 10.3 (*)    HCT 33.5 (*)    RDW 15.6 (*)    Platelets 105 (*)    All other components within normal limits  BRAIN NATRIURETIC PEPTIDE - Abnormal; Notable for the following components:   B Natriuretic Peptide 4,093.2 (*)    All other components within normal limits  TROPONIN I - Abnormal; Notable for the following components:   Troponin I 0.26 (*)    All other components within normal limits  CULTURE, BLOOD (ROUTINE X 2)  CULTURE, BLOOD (ROUTINE X 2)  CULTURE, EXPECTORATED SPUTUM-ASSESSMENT  GRAM STAIN  RESPIRATORY PANEL BY PCR  HIV ANTIBODY (ROUTINE TESTING)  STREP PNEUMONIAE URINARY ANTIGEN  PROCALCITONIN  MAGNESIUM  TSH  I-STAT CG4 LACTIC ACID, ED  I-STAT CG4 LACTIC ACID, ED   ____________________________________________  EKG   EKG Interpretation  Date/Time:  Monday March 09 2017 10:12:53 EST Ventricular Rate:  67 PR Interval:  214 QRS Duration: 124 QT Interval:  436 QTC Calculation: 460 R Axis:   -15 Text Interpretation:  Sinus rhythm with 1st degree A-V block Septal infarct , age undetermined Abnormal ECG No significant change since last tracing Confirmed by Marily MemosMesner, Arbutus Nelligan [phone]) on 03/09/2017 11:08:49 AM       ____________________________________________  RADIOLOGY  Dg Chest 2 View  Result Date: 03/09/2017 CLINICAL DATA:  Shortness of breath and cough for 2 days EXAM: CHEST  2 VIEW COMPARISON:  02/03/2017 FINDINGS: Normal heart size. Aortic atherosclerosis. Small left pleural effusion. Opacity within the posterior left lower lobe identified compatible is identified on the lateral radiograph. Diffuse chronic interstitial coarsening identified bilaterally. IMPRESSION: 1. Small left pleural effusion and left lower lobe airspace opacity which may represent pneumonia 2.  Aortic Atherosclerosis (ICD10-I70.0). Electronically Signed   By: Signa Kellaylor  Stroud M.D.   On: 03/09/2017 11:23    ____________________________________________  PROCEDURES  Procedure(s) performed:   Procedures   ____________________________________________   INITIAL IMPRESSION / ASSESSMENT AND PLAN / ED COURSE  cxr with questionable opacity, started antibiotics however symptoms seem more c/w CHF exacerbation.  Troponin elevated. Some arm pain, but ecg ok recent troponins >2, CKD, this is likely not NSTEMI 2/2 infarction rather fluid overload. Will await BNP and discuss with medicine.    Pertinent labs & imaging results that were available during my care of the patient were reviewed by me and considered in my medical decision making (see chart for details).  ____________________________________________  FINAL CLINICAL IMPRESSION(S) / ED DIAGNOSES  Final diagnoses:  Acute on chronic congestive heart failure, unspecified heart failure type (HCC)     MEDICATIONS GIVEN DURING  THIS VISIT:  Medications  ipratropium-albuterol (DUONEB) 0.5-2.5 (3) MG/3ML nebulizer solution 3 mL (not administered)  furosemide (LASIX) injection 80 mg (not administered)  insulin aspart (novoLOG) injection 0-9 Units (not administered)  insulin aspart (novoLOG) injection 0-5 Units (not administered)  ceFEPIme (MAXIPIME) 1 g in dextrose 5 % 50 mL IVPB (not administered)  vancomycin (VANCOCIN) 1,500 mg in sodium chloride 0.9 % 500 mL IVPB (not administered)  vancomycin (VANCOCIN) IVPB 1000 mg/200 mL premix (not administered)  aspirin EC tablet 81 mg (not administered)  atorvastatin (LIPITOR) tablet 40 mg (not administered)  loratadine (CLARITIN) tablet 10 mg (not administered)  cromolyn (OPTICROM) 4 % ophthalmic solution 1 drop (not administered)  donepezil (ARICEPT) tablet 5 mg (not administered)  dorzolamide-timolol (COSOPT) 22.3-6.8 MG/ML ophthalmic solution 1 drop (not administered)  escitalopram (LEXAPRO) tablet 10 mg (not administered)  insulin glargine (LANTUS) injection 16 Units (not administered)  latanoprost (XALATAN) 0.005 % ophthalmic solution 1 drop (not administered)  sodium chloride flush (NS) 0.9 % injection 3 mL (not administered)  sodium chloride flush (NS) 0.9 % injection 3 mL (not administered)  0.9 %  sodium chloride infusion (not administered)  acetaminophen (TYLENOL) tablet 650 mg (not administered)    Or  acetaminophen (TYLENOL) suppository 650 mg (not administered)  zolpidem (AMBIEN) tablet 5 mg (not administered)  sorbitol 70 % solution 30 mL (not administered)  docusate sodium (ENEMEEZ) enema 283 mg (not administered)  ondansetron (ZOFRAN) tablet 4 mg (not administered)    Or  ondansetron (ZOFRAN) injection 4 mg (not administered)  camphor-menthol (SARNA) lotion 1 application (not administered)    And  hydrOXYzine (ATARAX/VISTARIL) tablet 25 mg (not administered)  calcium carbonate (dosed in mg elemental calcium) suspension 500 mg of elemental calcium  (not administered)  feeding supplement (NEPRO CARB STEADY) liquid 237 mL (not administered)  heparin injection 5,000 Units (not administered)  albuterol (PROVENTIL) (2.5 MG/3ML) 0.083% nebulizer solution 5 mg (5 mg Nebulization Given 03/09/17 1036)  cefTRIAXone (ROCEPHIN) 1 g in dextrose 5 % 50 mL IVPB (0 g Intravenous Stopped 03/09/17 1347)  aspirin chewable tablet 324 mg (324 mg Oral Given 03/09/17 1320)  furosemide (LASIX) injection 40 mg (40 mg Intravenous Given 03/09/17 1423)     NEW OUTPATIENT MEDICATIONS STARTED DURING THIS VISIT:  This SmartLink is deprecated. Use AVSMEDLIST instead to display the medication list for a patient.  Note:  This note was prepared with assistance of Dragon voice recognition software. Occasional wrong-word or sound-a-like substitutions may have occurred due to the inherent limitations of voice recognition software.   Marily MemosMesner, Ailanie Ruttan, MD 03/09/17 [phone]

## 2017-03-09 NOTE — ED Triage Notes (Signed)
Pt presents for evaluation of sob since Saturday. Hx of CHF, denies CP. States given neb x 3 with little improvement. Pt speaking in one-two word phrases.

## 2017-03-10 ENCOUNTER — Other Ambulatory Visit: Payer: Self-pay

## 2017-03-10 ENCOUNTER — Encounter (HOSPITAL_COMMUNITY): Payer: Self-pay | Admitting: General Practice

## 2017-03-10 ENCOUNTER — Inpatient Hospital Stay (HOSPITAL_COMMUNITY): Payer: Medicare PPO

## 2017-03-10 DIAGNOSIS — J189 Pneumonia, unspecified organism: Secondary | ICD-10-CM

## 2017-03-10 DIAGNOSIS — J9601 Acute respiratory failure with hypoxia: Secondary | ICD-10-CM

## 2017-03-10 DIAGNOSIS — I5043 Acute on chronic combined systolic (congestive) and diastolic (congestive) heart failure: Secondary | ICD-10-CM

## 2017-03-10 DIAGNOSIS — J181 Lobar pneumonia, unspecified organism: Secondary | ICD-10-CM

## 2017-03-10 DIAGNOSIS — I272 Pulmonary hypertension, unspecified: Secondary | ICD-10-CM

## 2017-03-10 DIAGNOSIS — N179 Acute kidney failure, unspecified: Secondary | ICD-10-CM

## 2017-03-10 DIAGNOSIS — N184 Chronic kidney disease, stage 4 (severe): Secondary | ICD-10-CM

## 2017-03-10 DIAGNOSIS — N189 Chronic kidney disease, unspecified: Secondary | ICD-10-CM

## 2017-03-10 DIAGNOSIS — R0603 Acute respiratory distress: Secondary | ICD-10-CM

## 2017-03-10 DIAGNOSIS — I340 Nonrheumatic mitral (valve) insufficiency: Secondary | ICD-10-CM

## 2017-03-10 DIAGNOSIS — J208 Acute bronchitis due to other specified organisms: Secondary | ICD-10-CM

## 2017-03-10 HISTORY — DX: Pneumonia, unspecified organism: J18.9

## 2017-03-10 LAB — BASIC METABOLIC PANEL
ANION GAP: 11 (ref 5–15)
BUN: 78 mg/dL — ABNORMAL HIGH (ref 6–20)
CHLORIDE: 102 mmol/L (ref 101–111)
CO2: 28 mmol/L (ref 22–32)
Calcium: 8.4 mg/dL — ABNORMAL LOW (ref 8.9–10.3)
Creatinine, Ser: 3.03 mg/dL — ABNORMAL HIGH (ref 0.44–1.00)
GFR calc non Af Amer: 13 mL/min — ABNORMAL LOW (ref >60)
GFR, EST AFRICAN AMERICAN: 15 mL/min — AB (ref >60)
Glucose, Bld: 88 mg/dL (ref 65–99)
POTASSIUM: 3.9 mmol/L (ref 3.5–5.1)
SODIUM: 141 mmol/L (ref 135–145)

## 2017-03-10 LAB — GLUCOSE, CAPILLARY
GLUCOSE-CAPILLARY: 100 mg/dL — AB (ref 65–99)
Glucose-Capillary: 76 mg/dL (ref 65–99)
Glucose-Capillary: 194 mg/dL — ABNORMAL HIGH (ref 65–99)
Glucose-Capillary: 327 mg/dL — ABNORMAL HIGH (ref 65–99)

## 2017-03-10 LAB — HIV ANTIBODY (ROUTINE TESTING W REFLEX): HIV SCREEN 4TH GENERATION: NONREACTIVE

## 2017-03-10 MED ORDER — METHYLPREDNISOLONE SODIUM SUCC 125 MG IJ SOLR
60.0000 mg | Freq: Two times a day (BID) | INTRAMUSCULAR | Status: DC
  Administered 2017-03-10 – 2017-03-14 (×9): 60 mg via INTRAVENOUS
  Filled 2017-03-10 (×9): qty 2

## 2017-03-10 MED ORDER — IPRATROPIUM-ALBUTEROL 0.5-2.5 (3) MG/3ML IN SOLN
3.0000 mL | Freq: Four times a day (QID) | RESPIRATORY_TRACT | Status: DC
  Administered 2017-03-10 – 2017-03-12 (×9): 3 mL via RESPIRATORY_TRACT
  Filled 2017-03-10 (×9): qty 3

## 2017-03-10 MED ORDER — ALBUTEROL SULFATE (2.5 MG/3ML) 0.083% IN NEBU
2.5000 mg | INHALATION_SOLUTION | RESPIRATORY_TRACT | Status: DC | PRN
  Filled 2017-03-10: qty 3

## 2017-03-10 MED ORDER — GUAIFENESIN ER 600 MG PO TB12
600.0000 mg | ORAL_TABLET | Freq: Two times a day (BID) | ORAL | Status: DC
  Administered 2017-03-10 – 2017-03-14 (×9): 600 mg via ORAL
  Filled 2017-03-10 (×9): qty 1

## 2017-03-10 NOTE — Plan of Care (Signed)
Pt. Requires max assistance with transfers from bed to chair and repositioning in bed. Weakness in legs noted.

## 2017-03-10 NOTE — Evaluation (Signed)
Physical Therapy Evaluation Patient Details Name: Bethany Nelson MRN: [id number] DOB: [birth year] Today's Date: 03/10/2017   History of Present Illness  84 year old female with PMH of her IDDM, dementia, stage IV chronic kidney disease, acute on chronic systolic and diastolic CHF in the context of severe MR and pulmonary hypertension, not surgical candidate, recently hospitalized and discharged 02/06/17 when home hospice was recommended which family declined, presented with worsening dyspnea, PND cough without fever or chills and wheezing. Admitted for multifactorial dyspnea due to decompensated CHF, possible pneumonia and acute viral bronchitis. Cardiology and palliative care medicine consulted.  Clinical Impression  Pt admitted with above diagnosis. Pt currently with functional limitations due to the deficits listed below (see PT Problem List). Pt was able to take pivotal steps to recliner with assist. Fatigues quickly. Daughter supportive to take pt home at d/c.  Will follow acutely.  Pt will benefit from skilled PT to increase their independence and safety with mobility to allow discharge to the venue listed below.      Follow Up Recommendations Home health PT;Supervision/Assistance - 24 hour    Equipment Recommendations  None recommended by PT    Recommendations for Other Services       Precautions / Restrictions Precautions Precautions: Fall Restrictions Weight Bearing Restrictions: No      Mobility  Bed Mobility Overal bed mobility: Needs Assistance Bed Mobility: Supine to Sit     Supine to sit: Mod assist     General bed mobility comments: needed assist for LEs and to elevate trunk  Transfers Overall transfer level: Needs assistance Equipment used: Rolling walker (2 wheeled) Transfers: Sit to/from [employer] Sit to Stand: Mod assist Stand pivot transfers: Mod assist       General transfer comment: Pt needed assist to power up and cues for hand  placement. Pt also needed assist and cues to stand pivot to recliner from bed.  Pt needed assist to weight shift and assist to control descent into chair.   Ambulation/Gait                Stairs            Wheelchair Mobility    Modified Rankin (Stroke Patients Only)       Balance Overall balance assessment: Needs assistance Sitting-balance support: No upper extremity supported;Feet supported Sitting balance-Leahy Scale: Fair     Standing balance support: Bilateral upper extremity supported;During functional activity Standing balance-Leahy Scale: Poor Standing balance comment: relies on UE support and needed cues to stand tall as she flexes as she fatigues.  Had to stand for 5 min to change Dependsand clean pt.  Pt needed mod assist at times for balance.  Pt was able to step into new Depends but needing mod assist for sfaety with RW.                              Pertinent Vitals/Pain Pain Assessment: No/denies pain  2LO2 left on as she desats if removed.  Home Living Family/patient expects to be discharged to:: Private residence Living Arrangements: Children Available Help at Discharge: Family;Friend(s);Available 24 hours/day Type of Home: House Home Access: Stairs to enter   [employer] of Steps: 1 Home Layout: One level Home Equipment: Emergency planning/management officer - 4 wheels      Prior Function Level of Independence: Needs assistance   Gait / Transfers Assistance Needed: uses a rollator to ambulate  ADL's / Homemaking Assistance Needed:  needs assist bathing, can dress herself with occasional assist from her daughter        Hand Dominance   Dominant Hand: Right    Extremity/Trunk Assessment   Upper Extremity Assessment Upper Extremity Assessment: Defer to OT evaluation    Lower Extremity Assessment Lower Extremity Assessment: Generalized weakness    Cervical / Trunk Assessment Cervical / Trunk Assessment: Normal  Communication    Communication: HOH  Cognition Arousal/Alertness: Awake/alert Behavior During Therapy: Flat affect Overall Cognitive Status: History of cognitive impairments - at baseline                                        General Comments General comments (skin integrity, edema, etc.): Pt soiled Depends and had to clean pt prior to assisting her to chair.     Exercises General Exercises - Lower Extremity Ankle Circles/Pumps: AROM;Both;5 reps;Supine Long Arc Quad: AROM;Both;10 reps;Seated Hip Flexion/Marching: AROM;Both;5 reps;Seated   Assessment/Plan    PT Assessment Patient needs continued PT services  PT Problem List Decreased strength;Decreased activity tolerance;Decreased mobility;Decreased balance;Decreased knowledge of use of DME;Decreased safety awareness;Decreased cognition;Decreased knowledge of precautions;Cardiopulmonary status limiting activity       PT Treatment Interventions DME instruction;Gait training;Functional mobility training;Therapeutic activities;Therapeutic exercise;Balance training;Patient/family education;Stair training    PT Goals (Current goals can be found in the Care Plan section)  Acute Rehab PT Goals Patient Stated Goal: to go home PT Goal Formulation: With patient Time For Goal Achievement: 03/24/17 Potential to Achieve Goals: Good    Frequency Min 3X/week   Barriers to discharge        Co-evaluation               AM-PAC PT "6 Clicks" Daily Activity  Outcome Measure Difficulty turning over in bed (including adjusting bedclothes, sheets and blankets)?: Unable Difficulty moving from lying on back to sitting on the side of the bed? : Unable Difficulty sitting down on and standing up from a chair with arms (e.g., wheelchair, bedside commode, etc,.)?: Unable Help needed moving to and from a bed to chair (including a wheelchair)?: A Lot Help needed walking in hospital room?: Total Help needed climbing 3-5 steps with a railing? :  Total 6 Click Score: 7    End of Session Equipment Utilized During Treatment: Gait belt;Oxygen Activity Tolerance: Patient limited by fatigue Patient left: in chair;with call bell/phone within reach;with chair alarm set;with family/visitor present Nurse Communication: Mobility status;Need for lift equipment PT Visit Diagnosis: Unsteadiness on feet (R26.81);Muscle weakness (generalized) (M62.81)    Time: [id number] PT Time Calculation (min) (ACUTE ONLY): 34 min   Charges:   PT Evaluation $PT Eval Moderate Complexity: 1 Mod PT Treatments $Therapeutic Activity: 8-22 mins   PT G Codes:        Aaliah Jorgenson,PT Acute Rehabilitation [phone])   Berline Lopesawn F Gyan Cambre 03/10/2017, 3:30 PM

## 2017-03-10 NOTE — Consult Note (Signed)
CARDIOLOGY CONSULT NOTE   Patient ID: Bethany Nelson MRN: [id number], DOB/AGE: [birth year]   Admit date: 03/09/2017 Date of Consult: 03/10/2017  Primary Physician: Jerl Mina, MD Primary Cardiologist: Dr Eldridge Dace  Reason for consult:  CHF, severe mitral regurgitation  Bethany Nelson is a 84 y.o. female who is being seen today for the evaluation of CHF at the request of Dr. Waymon Amato.  Problem List  Past Medical History:  Diagnosis Date  . Chronic diastolic congestive heart failure (HCC)    a. 02/2016: echo showing normal EF, Grade 3 DD, mild MR, trivial TR  . CKD (chronic kidney disease), stage IV (HCC)   . Dementia   . Diabetes mellitus without complication (HCC)   . Hypertension   . Pneumonia 03/10/2017    Past Surgical History:  Procedure Laterality Date  . CATARACT EXTRACTION    . THYROID SURGERY       Allergies  Allergies  Allergen Reactions  . Zithromax [Azithromycin] Other (See Comments)    Noted red enhancement of vein distal to IV insertion site after several minutes of IV drug infusion- perfect outline of vein branch- no itching or edema    HPI   Bethany Nelson is an 87 female with diastolic HF, HTN, CKD, DM and HL. She had a echo back in 4/18 with normal systolic function and G2DD with high filling pressures. Was admitted during that time for CHF and diuresed. Her metoprolol was decreased to 12.5mg  BID for low heart rate. Presented back to the office 11/24/16 and noted her weight was up by 4 lbs. Was c/o of shortness of breath with light activity. She was instructed to take 40mg  lasix BID for 3 additional days then return to home dose of 40mg  daily.  She was admited again on 02/03/2017 with acute on chronic CHF, elevated troponin. She was diagnosed with severe MR and pulmonary hypertension, she was considered not a surgical candidate and hospice was recommended and turned down by the patient.   She presents again with progressively worsening SOB, cough  productive of white to yellowish sputum, no fever, chills, no chest pain. 2 pillow orthopnea, no LE edema.   Inpatient Medications  . [START ON 03/11/2017] aspirin EC  81 mg Oral QODAY  . atorvastatin  40 mg Oral Daily  . cromolyn  1 drop Both Eyes QID  . donepezil  5 mg Oral Daily  . dorzolamide-timolol  1 drop Both Eyes BID  . escitalopram  10 mg Oral Daily  . furosemide  80 mg Intravenous Q12H  . heparin injection (subcutaneous)  5,000 Units Subcutaneous Q8H  . insulin aspart  0-5 Units Subcutaneous QHS  . insulin aspart  0-9 Units Subcutaneous TID WC  . insulin glargine  16 Units Subcutaneous BH-q7a  . ipratropium-albuterol  3 mL Nebulization Q6H  . latanoprost  1 drop Both Eyes QHS  . loratadine  10 mg Oral Daily  . methylPREDNISolone (SOLU-MEDROL) injection  60 mg Intravenous Q12H  . sodium chloride flush  3 mL Intravenous Q12H    Family History Family History  Problem Relation Age of Onset  . Esophageal cancer Mother   . Heart disease Neg Hx      Social History Social History   Socioeconomic History  . Marital status: Widowed    Spouse name: Not on file  . Number of children: Not on file  . Years of education: Not on file  . Highest education level: Not on file  Social Needs  . Financial  resource strain: Not on file  . Food insecurity - worry: Not on file  . Food insecurity - inability: Not on file  . Transportation needs - medical: Not on file  . Transportation needs - non-medical: Not on file  Occupational History  . Not on file  Tobacco Use  . Smoking status: Never Smoker  . Smokeless tobacco: Never Used  Substance and Sexual Activity  . Alcohol use: No  . Drug use: No  . Sexual activity: Not on file  Other Topics Concern  . Not on file  Social History Narrative  . Not on file     Review of Systems  General:  No chills, fever, night sweats or weight changes.  Cardiovascular:  No chest pain, dyspnea on exertion, edema, orthopnea, palpitations,  paroxysmal nocturnal dyspnea. Dermatological: No rash, lesions/masses Respiratory: No cough, dyspnea Urologic: No hematuria, dysuria Abdominal:   No nausea, vomiting, diarrhea, bright red blood per rectum, melena, or hematemesis Neurologic:  No visual changes, wkns, changes in mental status. All other systems reviewed and are otherwise negative except as noted above.  Physical Exam  Blood pressure 123/61, pulse (!) 59, temperature 98.1 F (36.7 C), temperature source Oral, resp. rate 15, height 5\' 5"  (1.651 m), weight 181 lb (82.1 kg), SpO2 100 %.  General: Pleasant, in labored breathing Psych: Normal affect. Neuro: Alert and oriented X 3. Moves all extremities spontaneously. HEENT: Normal  Neck: Supple without bruits or JVD. Lungs:  Resp regular and unlabored, wheezing in and expiratory, crackles at the bases. Heart: RRR no s3, s4, 4/6 holosystolic murmur. Abdomen: Soft, non-tender, non-distended, BS + x 4.  Extremities: No clubbing, cyanosis or edema. DP/PT/Radials 2+ and equal bilaterally.  Labs  Recent Labs    03/09/17 1029  TROPONINI 0.26*   Lab Results  Component Value Date   WBC 6.0 03/09/2017   HGB 10.3 (L) 03/09/2017   HCT 33.5 (L) 03/09/2017   MCV 100.0 03/09/2017   PLT 105 (L) 03/09/2017    Recent Labs  Lab 03/09/17 1029 03/10/17 0425  NA 136 141  K 4.5 3.9  CL 101 102  CO2 23 28  BUN 76* 78*  CREATININE 2.90* 3.03*  CALCIUM 9.0 8.4*  PROT 6.4*  --   BILITOT 0.9  --   ALKPHOS 40  --   ALT 25  --   AST 37  --   GLUCOSE 280* 88   No results found for: CHOL, HDL, LDLCALC, TRIG No results found for: DDIMER Invalid input(s): POCBNP  Radiology/Studies  Dg Chest 2 View  Result Date: 03/10/2017 CLINICAL DATA:  Heart failure EXAM: CHEST  2 VIEW COMPARISON:  03/09/2017 FINDINGS: Cardiac shadow is enlarged but stable. Increasing vascular congestion with interstitial edema is noted consistent with congestive failure. Aortic calcifications and mitral valve  calcifications are again seen. Persistent left lower lobe infiltrate is noted. IMPRESSION: Increasing congestive failure. Electronically Signed   By: Alcide CleverMark  Lukens M.D.   On: 03/10/2017 09:29   Dg Chest 2 View  Result Date: 03/09/2017 CLINICAL DATA:  Shortness of breath and cough for 2 days EXAM: CHEST  2 VIEW COMPARISON:  02/03/2017 FINDINGS: Normal heart size. Aortic atherosclerosis. Small left pleural effusion. Opacity within the posterior left lower lobe identified compatible is identified on the lateral radiograph. Diffuse chronic interstitial coarsening identified bilaterally. IMPRESSION: 1. Small left pleural effusion and left lower lobe airspace opacity which may represent pneumonia 2.  Aortic Atherosclerosis (ICD10-I70.0). Electronically Signed   By: Veronda Prudeaylor  Stroud M.D.  On: 03/09/2017 11:23   Echocardiogram 02/03/2017  - Left ventricle: The cavity size was normal. Wall thickness was increased in a pattern of moderate LVH. Systolic function was severely reduced. The estimated ejection fraction was in the range of 25% to 30%. Diffuse hypokinesis with regional variation. Doppler parameters are consistent with restrictive left ventricular relaxation (grade 3 diastolic dysfunction). The E/e&' ratio is >20, suggesting markedly elevated LV filling pressure. - Aortic valve: Mildly calcified leaflets. There was no stenosis. There was no regurgitation. - Mitral valve: Calcified annulus. Mildly thickened leaflets . There was moderate to severe regurgitation. - Left atrium: The atrium was moderately dilated. - Right ventricle: The cavity size was mildly dilated. The moderator band was prominent. Systolic function was normal. - Right atrium: The atrium was at the upper limits of normal in size. - Atrial septum: No defect or patent foramen ovale was identified. - Tricuspid valve: There was moderate regurgitation. - Pulmonary arteries: PA peak pressure: 76 mm Hg (S). -  Inferior vena cava: The vessel was dilated. The respirophasic diameter changes were blunted (<50%), consistent with elevated central venous pressure.  Impressions:  - Compared to a prior study in 06/2016, the LVEF has decreased from 60-65% down to 25-30%, there is grade 3 DD with very high biventricular filling pressures, moderate to severe MR, moderate LAE, mild RVE and upper normal RA size, moderate TR, and severe pulmonary hypertension with an RVSP of 76 mmHg.  ECG: SR, 1. AVB, no ST - Wave abnormalities, personally reviewed    Patient Profile   84 y.o.femalewith PMH of diastolic HF, HTN, CKD, DM and HL who presented with acute on chronic combined systolic and diastolic CHF  Assessment & Plan   1. Acute on chronic combined systolic and diastolic ZOX:[id number] with worsening shortness of breath over the past several days.  - discharge weight on 11/30 - 184 lbs, now 180 lbs - discharged on lasix 80 mg po BID, started on 80 mg iv BID here, I would continue  - BNP 4100 - Crea Cr 2.74--> 2.9--> 3.0  2. Acute respiratory infection   - I agree with steroids, albuterol/atrovent, cefepime initiated by primary team   2. Elevated Troponin:demand ischemia with CHF and CKD stage 4, now 0.26, prior admission up to 3 No chest pain.Continue ASA and statin.   3. Severe MR/pulmonary WJX:BJYNW on echo, with PA pressure of 76 mmHg.Not a candidate for invasive therapy.  4. AKI on CKD:Cr 2.74--> 2.9--> 3.0  Signed, Tobias Alexander, MD, [facility] 03/10/2017, 12:24 PM

## 2017-03-10 NOTE — Plan of Care (Signed)
Pt. Requires max assistance while standing.

## 2017-03-10 NOTE — Progress Notes (Addendum)
PROGRESS NOTE   Bethany Nelson  ZOX:[id number]    DOB: 06/23/1926    DOA: 03/09/2017  PCP: Jerl Mina, MD   I have briefly reviewed patients previous medical records in [facility].  Brief Narrative:  84 year old female with PMH of her IDDM, dementia, stage IV chronic kidney disease, acute on chronic systolic and diastolic CHF in the context of severe MR and pulmonary hypertension, not surgical candidate, recently hospitalized and discharged 02/06/17 when home hospice was recommended which family declined, presented with worsening dyspnea, PND cough without fever or chills and wheezing. Admitted for multifactorial dyspnea due to decompensated CHF, possible pneumonia and acute viral bronchitis. Cardiology and palliative care medicine consulted.    Assessment & Plan:   Principal Problem:   Acute on chronic combined systolic and diastolic heart failure (HCC) Active Problems:   Acute respiratory failure with hypoxemia (HCC)   Severe mitral regurgitation   Moderate to severe pulmonary hypertension (HCC)   Dementia   Diabetes mellitus type 2, insulin dependent (HCC)   CKD (chronic kidney disease) stage 4, GFR 15-29 ml/min (HCC)   HCAP (healthcare-associated pneumonia)   1. Acute on chronic combined systolic and diastolic CHF with moderate to severe pulmonary hypertension secondary to severe mitral regurgitation: TTE during recent admission: LVEF 25-30 percent, grade 3 diastolic dysfunction, moderate to severe mitral regurgitation and severe pulmonary hypertension. Not surgical candidate for MR surgery. Home hospice was recommended but family declined. BNP 4100. Started Lasix 80 mg IV every 12 hours. Intake output not accurate. Not on ACEI/ARB due to advanced chronic kidney disease. Cardiology input appreciated, continue aggressive diuresis. Chest x-ray personally reviewed and shows worsening CHF. 2. Lobar pneumonia (LLL): Recently started on oral Augmentin by PCP. Continue empirically  started IV cefepime and vancomycin. All of this could be asymmetric edema. 3. Acute viral bronchitis with bronchospasm: RSV positive for coronavirus. Significant bronchospasm could be due to CHF, pneumonia or acute viral bronchitis. Bronchodilator nebulizations, IV Solu-Medrol 60 mg every 12 hourly. Supportive treatment. 4. Dyspnea/? Acute respiratory failure with hypoxia: Although dyspneic, no hypoxia documented since admission. Secondary to above etiology. Oxygen supplementation as needed. 5. Elevated troponin: Multifactorial related to demand ischemia from respiratory distress, CHF, CKD 4. 6. IDDM: Continue Lantus 16 units daily and NovoLog SSI. May need adjustment due to steroids. 7. Acute on Stage IV chronic kidney disease: Baseline creatinine may be in the 1.8-1.9 range. However this may have changed during recent hospitalization when creatinine ranged between 2.6-2.7. Presented with creatinine of 2.9. This is marginally increased to 3.03. Likely related to cardiorenal and diuretics. Follow closely. Not candidate for dialysis. 8. Essential hypertension: Controlled. Amlodipine held. 9. Dementia: Continue Aricept. Mental status may be at baseline. 10. Dyslipidemia: Statins. 11. Adult failure to thrive: Multifactorial due to advanced age, dementia and multiple severe significant comorbidities. Discussed in detail with patient's daughter. Palliative care consultation requested. 12. Anemia: Likely due to chronic disease and chronic kidney disease. Stable. 13. Thrombocytopenia, chronic: Stable.   DVT prophylaxis: Heparin Code Status: Full. This was confirmed with patient's daughter today. Family Communication: Discussed in detail with patient's daughter this morning. Updated care and answered questions. Updated her that patient was critically ill with multi organ dysfunction and at high risk for decline including death. Disposition: To be determined.   Consultants:  Cardiology Palliative care  medicine   Procedures:  None  Antimicrobials:  IV cefepime and vancomycin.    Subjective: Confused. Not reliable historian. Noted to be coughing, wheezing and mildly dyspneic this  morning. No pain reported. Discussed in detail with patient's RN.   ROS: As above.  Objective:  Vitals:   03/10/17 0346 03/10/17 0941 03/10/17 1157 03/10/17 1200  BP: (!) 117/45   123/61  Pulse: (!) 56  (!) 59 (!) 59  Resp: 18  15 15   Temp: 98 F (36.7 C)   98.1 F (36.7 C)  TempSrc: Oral  Oral Oral  SpO2: 94% 95% 100% 100%  Weight: 82.1 kg (181 lb)     Height:        Examination:  General exam: Elderly female, moderately built and nourished, sitting up in bed getting ready to eat her breakfast this morning, audibly wheezing, intermittently coughing and mild increased work of breathing. Respiratory system: Reduced breath sounds bilaterally with scattered bilateral medium paced expiratory rhonchi and occasional basal crackles. Mild increased work of breathing. Cardiovascular system: S1 & S2 heard, RRR. No JVD, rubs, gallops or clicks. 3 x 6 systolic murmur at apex. Trace bilateral leg edema.  Gastrointestinal system: Abdomen is nondistended/Obese, soft and nontender. No organomegaly or masses felt. Normal bowel sounds heard. Central nervous system: Alert and oriented only to self . No focal neurological deficits. Extremities: Symmetric 5 x 5 power. Skin: No rashes, lesions or ulcers Psychiatry: Judgement and insight impaired . Mood & affect appropriate.     Data Reviewed: I have personally reviewed following labs and imaging studies  CBC: Recent Labs  Lab 03/09/17 1029  WBC 6.0  NEUTROABS 5.0  HGB 10.3*  HCT 33.5*  MCV 100.0  PLT 105*   Basic Metabolic Panel: Recent Labs  Lab 03/09/17 1029 03/09/17 1609 03/10/17 0425  NA 136  --  141  K 4.5  --  3.9  CL 101  --  102  CO2 23  --  28  GLUCOSE 280*  --  88  BUN 76*  --  78*  CREATININE 2.90*  --  3.03*  CALCIUM 9.0  --  8.4*    MG  --  2.6*  --    Liver Function Tests: Recent Labs  Lab 03/09/17 1029  AST 37  ALT 25  ALKPHOS 40  BILITOT 0.9  PROT 6.4*  ALBUMIN 3.1*   Cardiac Enzymes: Recent Labs  Lab 03/09/17 1029  TROPONINI 0.26*   CBG: Recent Labs  Lab 03/09/17 1615 03/09/17 2108 03/10/17 0744 03/10/17 1154  GLUCAP 246* 156* 76 100*    Recent Results (from the past 240 hour(s))  Culture, blood (routine x 2) Call MD if unable to obtain prior to antibiotics being given     Status: None (Preliminary result)   Collection Time: 03/09/17  4:24 PM  Result Value Ref Range Status   Specimen Description BLOOD LEFT HAND  Final   Special Requests   Final    BOTTLES DRAWN AEROBIC AND ANAEROBIC Blood Culture adequate volume   Culture PENDING  Incomplete   Report Status PENDING  Incomplete  Respiratory Panel by PCR     Status: Abnormal   Collection Time: 03/09/17  4:47 PM  Result Value Ref Range Status   Adenovirus NOT DETECTED NOT DETECTED Final   Coronavirus 229E DETECTED (A) NOT DETECTED Final   Coronavirus HKU1 NOT DETECTED NOT DETECTED Final   Coronavirus NL63 NOT DETECTED NOT DETECTED Final   Coronavirus OC43 NOT DETECTED NOT DETECTED Final   Metapneumovirus NOT DETECTED NOT DETECTED Final   Rhinovirus / Enterovirus NOT DETECTED NOT DETECTED Final   Influenza A NOT DETECTED NOT DETECTED Final   Influenza  B NOT DETECTED NOT DETECTED Final   Parainfluenza Virus 1 NOT DETECTED NOT DETECTED Final   Parainfluenza Virus 2 NOT DETECTED NOT DETECTED Final   Parainfluenza Virus 3 NOT DETECTED NOT DETECTED Final   Parainfluenza Virus 4 NOT DETECTED NOT DETECTED Final   Respiratory Syncytial Virus NOT DETECTED NOT DETECTED Final   Bordetella pertussis NOT DETECTED NOT DETECTED Final   Chlamydophila pneumoniae NOT DETECTED NOT DETECTED Final   Mycoplasma pneumoniae NOT DETECTED NOT DETECTED Final         Radiology Studies: Dg Chest 2 View  Result Date: 03/10/2017 CLINICAL DATA:  Heart failure  EXAM: CHEST  2 VIEW COMPARISON:  03/09/2017 FINDINGS: Cardiac shadow is enlarged but stable. Increasing vascular congestion with interstitial edema is noted consistent with congestive failure. Aortic calcifications and mitral valve calcifications are again seen. Persistent left lower lobe infiltrate is noted. IMPRESSION: Increasing congestive failure. Electronically Signed   By: Alcide CleverMark  Lukens M.D.   On: 03/10/2017 09:29   Dg Chest 2 View  Result Date: 03/09/2017 CLINICAL DATA:  Shortness of breath and cough for 2 days EXAM: CHEST  2 VIEW COMPARISON:  02/03/2017 FINDINGS: Normal heart size. Aortic atherosclerosis. Small left pleural effusion. Opacity within the posterior left lower lobe identified compatible is identified on the lateral radiograph. Diffuse chronic interstitial coarsening identified bilaterally. IMPRESSION: 1. Small left pleural effusion and left lower lobe airspace opacity which may represent pneumonia 2.  Aortic Atherosclerosis (ICD10-I70.0). Electronically Signed   By: Signa Kellaylor  Stroud M.D.   On: 03/09/2017 11:23        Scheduled Meds: . [START ON 03/11/2017] aspirin EC  81 mg Oral QODAY  . atorvastatin  40 mg Oral Daily  . cromolyn  1 drop Both Eyes QID  . donepezil  5 mg Oral Daily  . dorzolamide-timolol  1 drop Both Eyes BID  . escitalopram  10 mg Oral Daily  . furosemide  80 mg Intravenous Q12H  . guaiFENesin  600 mg Oral BID  . heparin injection (subcutaneous)  5,000 Units Subcutaneous Q8H  . insulin aspart  0-5 Units Subcutaneous QHS  . insulin aspart  0-9 Units Subcutaneous TID WC  . insulin glargine  16 Units Subcutaneous BH-q7a  . ipratropium-albuterol  3 mL Nebulization Q6H  . latanoprost  1 drop Both Eyes QHS  . loratadine  10 mg Oral Daily  . methylPREDNISolone (SOLU-MEDROL) injection  60 mg Intravenous Q12H  . sodium chloride flush  3 mL Intravenous Q12H   Continuous Infusions: . sodium chloride    . ceFEPime (MAXIPIME) IV Stopped (03/09/17 1719)  . [START  ON 03/11/2017] vancomycin       LOS: 1 day   Critical care time spent: 65 minutes to include bedside interview, exam, evaluation and interpretation of lab and imaging, coordinating care with nursing, coordinating consultation with cardiology and palliative care team, discussion with family.  Marcellus ScottAnand Crissy Mccreadie, MD, FACP, [facility]. [facility] Pager [phone]  If 7PM-7AM, please contact night-coverage [email or web address] Password [email or web address] 03/10/2017, 1:17 PM

## 2017-03-11 ENCOUNTER — Inpatient Hospital Stay (HOSPITAL_COMMUNITY): Payer: Medicare PPO

## 2017-03-11 DIAGNOSIS — Z515 Encounter for palliative care: Secondary | ICD-10-CM

## 2017-03-11 DIAGNOSIS — J189 Pneumonia, unspecified organism: Secondary | ICD-10-CM

## 2017-03-11 LAB — CBC
HCT: 32.4 % — ABNORMAL LOW (ref 36.0–46.0)
Hemoglobin: 9.8 g/dL — ABNORMAL LOW (ref 12.0–15.0)
MCH: 30.6 pg (ref 26.0–34.0)
MCHC: 30.2 g/dL (ref 30.0–36.0)
MCV: 101.3 fL — AB (ref 78.0–100.0)
Platelets: 102 10*3/uL — ABNORMAL LOW (ref 150–400)
RBC: 3.2 MIL/uL — ABNORMAL LOW (ref 3.87–5.11)
RDW: 15 % (ref 11.5–15.5)
WBC: 4.4 10*3/uL (ref 4.0–10.5)

## 2017-03-11 LAB — BASIC METABOLIC PANEL
Anion gap: 13 (ref 5–15)
BUN: 90 mg/dL — AB (ref 6–20)
CHLORIDE: 102 mmol/L (ref 101–111)
CO2: 24 mmol/L (ref 22–32)
CREATININE: 3.22 mg/dL — AB (ref 0.44–1.00)
Calcium: 8.4 mg/dL — ABNORMAL LOW (ref 8.9–10.3)
GFR calc non Af Amer: 12 mL/min — ABNORMAL LOW (ref >60)
GFR, EST AFRICAN AMERICAN: 14 mL/min — AB (ref >60)
Glucose, Bld: 303 mg/dL — ABNORMAL HIGH (ref 65–99)
Potassium: 4.4 mmol/L (ref 3.5–5.1)
SODIUM: 139 mmol/L (ref 135–145)

## 2017-03-11 LAB — GLUCOSE, CAPILLARY
GLUCOSE-CAPILLARY: 308 mg/dL — AB (ref 65–99)
GLUCOSE-CAPILLARY: 239 mg/dL — AB (ref 65–99)
Glucose-Capillary: 328 mg/dL — ABNORMAL HIGH (ref 65–99)
Glucose-Capillary: 223 mg/dL — ABNORMAL HIGH (ref 65–99)

## 2017-03-11 MED ORDER — INSULIN GLARGINE 100 UNIT/ML ~~LOC~~ SOLN
21.0000 [IU] | SUBCUTANEOUS | Status: DC
  Administered 2017-03-12 – 2017-03-14 (×3): 21 [IU] via SUBCUTANEOUS
  Filled 2017-03-11 (×5): qty 0.21

## 2017-03-11 MED ORDER — INSULIN ASPART 100 UNIT/ML ~~LOC~~ SOLN
0.0000 [IU] | Freq: Every day | SUBCUTANEOUS | Status: DC
  Administered 2017-03-11 – 2017-03-13 (×3): 2 [IU] via SUBCUTANEOUS

## 2017-03-11 MED ORDER — INSULIN ASPART 100 UNIT/ML ~~LOC~~ SOLN
0.0000 [IU] | Freq: Three times a day (TID) | SUBCUTANEOUS | Status: DC
  Administered 2017-03-11: 5 [IU] via SUBCUTANEOUS
  Administered 2017-03-11: 11 [IU] via SUBCUTANEOUS
  Administered 2017-03-12: 3 [IU] via SUBCUTANEOUS
  Administered 2017-03-12: 11 [IU] via SUBCUTANEOUS
  Administered 2017-03-12: 5 [IU] via SUBCUTANEOUS
  Administered 2017-03-13: 3 [IU] via SUBCUTANEOUS
  Administered 2017-03-13: 8 [IU] via SUBCUTANEOUS
  Administered 2017-03-14: 5 [IU] via SUBCUTANEOUS
  Administered 2017-03-14: 3 [IU] via SUBCUTANEOUS

## 2017-03-11 MED ORDER — INSULIN GLARGINE 100 UNIT/ML ~~LOC~~ SOLN
5.0000 [IU] | Freq: Once | SUBCUTANEOUS | Status: AC
  Administered 2017-03-11: 5 [IU] via SUBCUTANEOUS
  Filled 2017-03-11: qty 0.05

## 2017-03-11 MED ORDER — INSULIN ASPART 100 UNIT/ML ~~LOC~~ SOLN: 0.0000 [IU] | Freq: Three times a day (TID) | SUBCUTANEOUS | Status: DC

## 2017-03-11 NOTE — Plan of Care (Signed)
Pt. Remains SOB with transfers, however able to participate while transferring from chair to bed.

## 2017-03-11 NOTE — Progress Notes (Signed)
PROGRESS NOTE   Bethany Nelson  RUE:[id number]    DOB: 03/19/1926    DOA: 03/09/2017  PCP: Jerl MinaHedrick, James, MD   I have briefly [facility] patients [facility].  Brief Narrative:  84 year old female with PMH of her IDDM, dementia, stage IV chronic kidney disease, acute on chronic systolic and diastolic CHF in the context of severe MR and pulmonary hypertension, not surgical candidate, recently hospitalized and discharged 02/06/17 when home hospice was recommended which family declined, presented with worsening dyspnea, PND cough without fever or chills and wheezing. Admitted for multifactorial dyspnea due to decompensated CHF, possible pneumonia and acute viral bronchitis. Cardiology and palliative care medicine consulted.    Assessment & Plan:   Principal Problem:   Acute on chronic combined systolic and diastolic heart failure (HCC) Active Problems:   Acute respiratory failure with hypoxemia (HCC)   Severe mitral regurgitation   Moderate to severe pulmonary hypertension (HCC)   Dementia   Diabetes mellitus type 2, insulin dependent (HCC)   CKD (chronic kidney disease) stage 4, GFR 15-29 ml/min (HCC)   HCAP (healthcare-associated pneumonia)   1. Acute on chronic combined systolic and diastolic CHF with moderate to severe pulmonary hypertension secondary to severe mitral regurgitation: TTE during recent admission: LVEF 25-30 percent, grade 3 diastolic dysfunction, moderate to severe mitral regurgitation and severe pulmonary hypertension. Not surgical candidate for MR surgery. At last admission, Home hospice was recommended but family declined. BNP 4100. Started Lasix 80 mg IV every 12 hours. Intake output not accurate/+[id number]. Not on ACEI/ARB due to advanced chronic kidney disease. Cardiology input appreciated, continue aggressive diuresis. Chest x-ray personally reviewed and shows worsening CHF. I discussed with Dr. Clifton JamesMcAlhany, Cardiology who agrees that patient  is hospice appropriate, will have Primary Cardiologist discuss with patient/family, await and palliative care team input. 2. Lobar pneumonia (LLL): Recently started on oral Augmentin by PCP. Continue empirically started IV cefepime and vancomycin. All of this could be asymmetric edema. Follow-up chest x-ray 1/3. 3. Acute viral bronchitis with bronchospasm: RSV positive for coronavirus. Significant bronchospasm could be due to CHF, pneumonia or acute viral bronchitis. Bronchodilator nebulizations, IV Solu-Medrol 60 mg every 12 hourly. Supportive treatment. Seems slightly better. 4. Dyspnea/? Acute respiratory failure with hypoxia: Although dyspneic, no hypoxia documented since admission. Secondary to above etiology. Oxygen supplementation as needed. Seems slightly better but still at high risk for decompensation. 5. Elevated troponin: Multifactorial related to demand ischemia from respiratory distress, CHF, CKD 4. Cardiology aware. 6. IDDM: Steroids causing worsening hyperglycemia. Increase Lantus to 21 units daily. Change sliding scale to moderate sensitivity. Monitor. 7. Acute on Stage IV chronic kidney disease: Baseline creatinine may be in the 1.8-1.9 range. However this may have changed during recent hospitalization when creatinine ranged between 2.6-2.7. Presented with creatinine of 2.9. Likely related to cardiorenal and diuretics. Follow closely. Not candidate for dialysis. Creatinine gradually creeping up/3.22 but will need ongoing diuresis. Difficult situation. 8. Essential hypertension: Controlled. Amlodipine held. 9. Dementia: Continue Aricept. Mental status may be at baseline. 10. Dyslipidemia: Statins. 11. Adult failure to thrive: Multifactorial due to advanced age, dementia and multiple severe significant comorbidities. Discussed in detail with patient's daughter 1/2. Palliative care consultation requested. 12. Anemia: Likely due to chronic disease and chronic kidney disease.  Stable. 13. Thrombocytopenia, chronic: Stable.   DVT prophylaxis: Heparin Code Status: Full. This was confirmed with patient's daughter today. Family Communication: Patient's "adopted daughter" at bedside. Updated care and answered questions. Disposition: To be determined.  Consultants:  Cardiology Palliative care medicine -pending.  Procedures:  None  Antimicrobials:  IV cefepime and vancomycin.    Subjective: Poor historian. Unable to say how she is doing and whether her dyspnea is better or not. She does seem more comfortable and not as dyspneic as yesterday. No pain reported. As per RN, agrees that she is somewhat improved compared to yesterday.  ROS: As above.  Objective:  Vitals:   03/10/17 2039 03/11/17 0201 03/11/17 0427 03/11/17 0850  BP:   124/67   Pulse:   65   Resp:   18   Temp:   97.6 F (36.4 C)   TempSrc:   Oral   SpO2: 96% 95% 100% 100%  Weight:   82.2 kg (181 lb 3.2 oz)   Height:        Examination:  General exam: Elderly female, moderately built and nourished, sitting up in bed, looks slightly better compared to yesterday and not in distress. Respiratory system: Slightly improved breath sounds but still harsh bilaterally with many bilateral medium pitched expiratory rhonchi and few basal crackles. No increased work of breathing. Cardiovascular system: S1 & S2 heard, RRR. No JVD, rubs, gallops or clicks. 3 x 6 systolic murmur at apex. Trace bilateral leg edema. Telemetry personally reviewed: Sinus rhythm with first-degree AV block/BBB morphology. Gastrointestinal system: Abdomen is nondistended/Obese, soft and nontender. No organomegaly or masses felt. Normal bowel sounds heard. Stable without change. Central nervous system: Alert and oriented only to self . No focal neurological deficits. Stable without change. Extremities: Symmetric 5 x 5 power. Skin: No rashes, lesions or ulcers Psychiatry: Judgement and insight impaired . Mood & affect  appropriate.     Data Reviewed: I have personally reviewed following labs and imaging studies  CBC: Recent Labs  Lab 03/09/17 1029 03/11/17 0728  WBC 6.0 4.4  NEUTROABS 5.0  --   HGB 10.3* 9.8*  HCT 33.5* 32.4*  MCV 100.0 101.3*  PLT 105* 102*   Basic Metabolic Panel: Recent Labs  Lab 03/09/17 1029 03/09/17 1609 03/10/17 0425 03/11/17 0728  NA 136  --  141 139  K 4.5  --  3.9 4.4  CL 101  --  102 102  CO2 23  --  28 24  GLUCOSE 280*  --  88 303*  BUN 76*  --  78* 90*  CREATININE 2.90*  --  3.03* 3.22*  CALCIUM 9.0  --  8.4* 8.4*  MG  --  2.6*  --   --    Liver Function Tests: Recent Labs  Lab 03/09/17 1029  AST 37  ALT 25  ALKPHOS 40  BILITOT 0.9  PROT 6.4*  ALBUMIN 3.1*   Cardiac Enzymes: Recent Labs  Lab 03/09/17 1029  TROPONINI 0.26*   CBG: Recent Labs  Lab 03/10/17 0744 03/10/17 1154 03/10/17 1629 03/10/17 2122 03/11/17 0745  GLUCAP 76 100* 194* 327* 308*    Recent Results (from the past 240 hour(s))  Culture, blood (routine x 2) Call MD if unable to obtain prior to antibiotics being given     Status: None (Preliminary result)   Collection Time: 03/09/17  4:24 PM  Result Value Ref Range Status   Specimen Description BLOOD LEFT HAND  Final   Special Requests   Final    BOTTLES DRAWN AEROBIC AND ANAEROBIC Blood Culture adequate volume   Culture NO GROWTH < 24 HOURS  Final   Report Status PENDING  Incomplete  Culture, blood (routine x 2) Call MD if unable  to obtain prior to antibiotics being given     Status: None (Preliminary result)   Collection Time: 03/09/17  4:24 PM  Result Value Ref Range Status   Specimen Description BLOOD RIGHT HAND  Final   Special Requests   Final    BOTTLES DRAWN AEROBIC AND ANAEROBIC Blood Culture adequate volume   Culture NO GROWTH < 24 HOURS  Final   Report Status PENDING  Incomplete  Respiratory Panel by PCR     Status: Abnormal   Collection Time: 03/09/17  4:47 PM  Result Value Ref Range Status    Adenovirus NOT DETECTED NOT DETECTED Final   Coronavirus 229E DETECTED (A) NOT DETECTED Final   Coronavirus HKU1 NOT DETECTED NOT DETECTED Final   Coronavirus NL63 NOT DETECTED NOT DETECTED Final   Coronavirus OC43 NOT DETECTED NOT DETECTED Final   Metapneumovirus NOT DETECTED NOT DETECTED Final   Rhinovirus / Enterovirus NOT DETECTED NOT DETECTED Final   Influenza A NOT DETECTED NOT DETECTED Final   Influenza B NOT DETECTED NOT DETECTED Final   Parainfluenza Virus 1 NOT DETECTED NOT DETECTED Final   Parainfluenza Virus 2 NOT DETECTED NOT DETECTED Final   Parainfluenza Virus 3 NOT DETECTED NOT DETECTED Final   Parainfluenza Virus 4 NOT DETECTED NOT DETECTED Final   Respiratory Syncytial Virus NOT DETECTED NOT DETECTED Final   Bordetella pertussis NOT DETECTED NOT DETECTED Final   Chlamydophila pneumoniae NOT DETECTED NOT DETECTED Final   Mycoplasma pneumoniae NOT DETECTED NOT DETECTED Final         Radiology Studies: Dg Chest 2 View  Result Date: 03/10/2017 CLINICAL DATA:  Heart failure EXAM: CHEST  2 VIEW COMPARISON:  03/09/2017 FINDINGS: Cardiac shadow is enlarged but stable. Increasing vascular congestion with interstitial edema is noted consistent with congestive failure. Aortic calcifications and mitral valve calcifications are again seen. Persistent left lower lobe infiltrate is noted. IMPRESSION: Increasing congestive failure. Electronically Signed   By: Alcide Clever M.D.   On: 03/10/2017 09:29        Scheduled Meds: . aspirin EC  81 mg Oral QODAY  . atorvastatin  40 mg Oral Daily  . cromolyn  1 drop Both Eyes QID  . donepezil  5 mg Oral Daily  . dorzolamide-timolol  1 drop Both Eyes BID  . escitalopram  10 mg Oral Daily  . furosemide  80 mg Intravenous Q12H  . guaiFENesin  600 mg Oral BID  . heparin injection (subcutaneous)  5,000 Units Subcutaneous Q8H  . insulin aspart  0-5 Units Subcutaneous QHS  . insulin aspart  0-9 Units Subcutaneous TID WC  . insulin  glargine  16 Units Subcutaneous BH-q7a  . ipratropium-albuterol  3 mL Nebulization Q6H  . latanoprost  1 drop Both Eyes QHS  . loratadine  10 mg Oral Daily  . methylPREDNISolone (SOLU-MEDROL) injection  60 mg Intravenous Q12H  . sodium chloride flush  3 mL Intravenous Q12H   Continuous Infusions: . sodium chloride    . ceFEPime (MAXIPIME) IV Stopped (03/10/17 1602)  . vancomycin       LOS: 2 days     Marcellus Scott, MD, FACP, [facility]. [facility] Pager [phone]  If 7PM-7AM, please contact night-coverage [email or web address] Password [facility] 03/11/2017, 11:32 AM

## 2017-03-11 NOTE — Progress Notes (Signed)
Addendum  RN paged to indicate patient had more audible crackles and breathing harder. Repeated chest x-ray which looks better than prior. Advised nebulization and giving evening dose of Lasix earlier. Rechecked later and patient doing better. Continue close monitoring.  Discussed with palliative care team and their input appreciated: Patient and family not interested in meeting with palliative care team at this time.  If declines, recommend CCM consult and transfer to ICU for BiPAP or intubation and mechanical ventilation.  Marcellus ScottAnand Shariyah Eland, MD, FACP, [facility]. [facility] Pager [phone]  If 7PM-7AM, please contact night-coverage [email or web address] Password [email or web address] 03/11/2017, 7:00 PM

## 2017-03-11 NOTE — Consult Note (Signed)
Consultation Note Date: 03/11/2017   Patient Name: Bethany Nelson  DOB: Aug 23, 1926  MRN: [id number]  Age / Sex: 84 y.o., female  PCP: [place] Pink, MD Referring Physician: Modena Jansky, MD  Reason for Consultation: Establishing goals of care  HPI/Patient Profile: 84 y.o. female  with past medical history of IDDM, dementia, stage IV chronic kidney disease, acute on chronic systolic and diastolic heart failure with severe mitral regurgitation and pulmonary hypertension admitted on 03/09/2017 with likely CHF exacerbation with concern for viral bronchitis or pneumonia.  Palliative consulted for goals of care.   Clinical Assessment and Goals of Care: Chart reviewed and discussed with bedside care team.  Chart review reveals that Bethany Nelson was seen by PMT at the end of November 2018 and plan for a period of time was transition home with hospice services.  Her daughter then changed her mind and she then transitioned home with home health.  I met today with Bethany Nelson, her granddaughter, and her daughter's friend at bedside.  Bethany Nelson is pleasantly confused and appropriate with simple questions but does not have any insight into her medical condition.  This is long-standing per family at bedside.  I introduced palliative care as specialized medical care for people living with serious illness. It focuses on providing relief from the symptoms and stress of a serious illness. The goal is to improve quality of life for both the patient and the family.  Her friend at bedside Bethany Nelson) reports that family is familiar with palliative services.  She reports that she spoke with patient's daughter, Bethany Nelson, this morning and Bethany Nelson is not interested in discussing further with palliative care at this time.  She reports that family is secure in their decision to continue with current care plan and does not desire to meet to  discuss further with our team.  I did call and was able to reach Bethany Nelson who confirmed the above conversation.  I did leave my card for family and her daughter will call if she would like to meet at any point in the future.  SUMMARY OF RECOMMENDATIONS   - Patient granddaughter and daughter's friend at bedside report that family is not interested in meeting with palliative care at this time.  She states that they are clear in desire to continue with current care plan.   -I did call and speak briefly with patient's daughter, Bethany Nelson, who reports that she does not feel that they have the need for palliative services at this time (she did meet with the palliative care provider and had actually planned on taking her mother home with hospice at one point in November of 2018 but family later changed mind).  Bethany Nelson will call if she feels that we can be of further assistance or if she would like to meet with palliative team in the future.  - Palliative care to remain available at any point, but we will hold on further visits or interaction at this time per family request.   - Please call if we  can be of further assistance in the care of Bethany Nelson at this time.  Code Status/Advance Care Planning:  Full code  Palliative Prophylaxis:   Delirium Protocol  Additional Recommendations (Limitations, Scope, Preferences):  Full Scope Treatment  Psycho-social/Spiritual:   Desire for further Chaplaincy support: Not addressed  Prognosis:   < 6 months and Bethany Nelson should qualify for hospice if so desired at any point in the future.  Family clear that they do not desire any further interaction with palliative or hospice at this point in time.  Discharge Planning: To Be Determined      Primary Diagnoses: Present on Admission: . Acute on chronic combined systolic and diastolic heart failure ([place]) . Acute respiratory failure with hypoxemia ([place]) . Severe mitral regurgitation . Moderate to severe  pulmonary hypertension ([place]) . Dementia . CKD (chronic kidney disease) stage 4, GFR 15-29 ml/min (HCC) . HCAP (healthcare-associated pneumonia)   I have reviewed the medical record, interviewed the patient and family, and examined the patient. The following aspects are pertinent.  Past Medical History:  Diagnosis Date  . Chronic diastolic congestive heart failure ([place])    a. 02/2016: echo showing normal EF, Grade 3 DD, mild MR, trivial TR  . CKD (chronic kidney disease), stage IV ([place])   . Dementia   . Diabetes mellitus without complication ([place])   . Hypertension   . Pneumonia 03/10/2017   Social History   Socioeconomic History  . Marital status: Widowed    Spouse name: None  . Number of children: None  . Years of education: None  . Highest education level: None  Social Needs  . Financial resource strain: None  . Food insecurity - worry: None  . Food insecurity - inability: None  . Transportation needs - medical: None  . Transportation needs - non-medical: None  Occupational History  . None  Tobacco Use  . Smoking status: Never Smoker  . Smokeless tobacco: Never Used  Substance and Sexual Activity  . Alcohol use: No  . Drug use: No  . Sexual activity: None  Other Topics Concern  . None  Social History Narrative  . None   Family History  Problem Relation Age of Onset  . Esophageal cancer Mother   . Heart disease Neg Hx    Scheduled Meds: . aspirin EC  81 mg Oral QODAY  . atorvastatin  40 mg Oral Daily  . cromolyn  1 drop Both Eyes QID  . donepezil  5 mg Oral Daily  . dorzolamide-timolol  1 drop Both Eyes BID  . escitalopram  10 mg Oral Daily  . furosemide  80 mg Intravenous Q12H  . guaiFENesin  600 mg Oral BID  . heparin injection (subcutaneous)  5,000 Units Subcutaneous Q8H  . insulin aspart  0-15 Units Subcutaneous TID WC  . insulin aspart  0-5 Units Subcutaneous QHS  . [START ON 03/12/2017] insulin glargine  21 Units Subcutaneous BH-q7a  .  ipratropium-albuterol  3 mL Nebulization Q6H  . latanoprost  1 drop Both Eyes QHS  . loratadine  10 mg Oral Daily  . methylPREDNISolone (SOLU-MEDROL) injection  60 mg Intravenous Q12H  . sodium chloride flush  3 mL Intravenous Q12H   Continuous Infusions: . sodium chloride    . ceFEPime (MAXIPIME) IV Stopped (03/10/17 1602)  . vancomycin     PRN Meds:.sodium chloride, acetaminophen **OR** acetaminophen, albuterol, calcium carbonate (dosed in mg elemental calcium), camphor-menthol **AND** hydrOXYzine, docusate sodium, feeding supplement (NEPRO CARB STEADY), ondansetron **OR** ondansetron (ZOFRAN)  IV, sodium chloride flush, sorbitol, zolpidem Medications Prior to Admission:  Prior to Admission medications   Medication Sig Start Date End Date Taking? Authorizing Provider  albuterol (PROVENTIL HFA) 108 (90 Base) MCG/ACT inhaler Inhale 1 puff into the lungs every 4 (four) hours as needed for wheezing. 08/18/16 08/18/17 Yes [provider]  amLODipine (NORVASC) 5 MG tablet Take 5 mg by mouth daily.   Yes [provider]  amoxicillin-clavulanate (AUGMENTIN) 875-125 MG tablet Take 1 tablet by mouth 2 (two) times daily. 03/07/17  Yes [provider]  aspirin EC 81 MG tablet Take 81 mg by mouth every other day.   Yes [provider]  atorvastatin (LIPITOR) 40 MG tablet Take 1 tablet (40 mg total) by mouth daily. 02/06/17  Yes Helberg, Larkin Ina, MD  benzonatate (TESSALON) 100 MG capsule Take 100 mg by mouth 3 (three) times daily as needed for cough. 03/07/17  Yes [provider]  cetirizine (ZYRTEC) 10 MG tablet Take 10 mg by mouth daily as needed for allergies.   Yes [provider]  cromolyn (OPTICROM) 4 % ophthalmic solution Place 1 drop into both eyes 4 (four) times daily.   Yes [provider]  donepezil (ARICEPT) 5 MG tablet Take 5 mg by mouth daily. 05/13/16  Yes [provider]  dorzolamide-timolol (COSOPT) 22.3-6.8 MG/ML  ophthalmic solution Place 1 drop into both eyes 2 (two) times daily. 12/16/15  Yes [provider]  escitalopram (LEXAPRO) 10 MG tablet Take 10 mg by mouth daily.    Yes [provider]  fluticasone (FLONASE) 50 MCG/ACT nasal spray Place 1 spray into both nostrils daily as needed for allergies.  02/17/16  Yes [provider]  furosemide (LASIX) 80 MG tablet Take 1 tablet (80 mg total) by mouth 2 (two) times daily. Patient taking differently: Take 80 mg by mouth daily.  02/06/17  Yes Helberg, Larkin Ina, MD  ipratropium-albuterol (DUONEB) 0.5-2.5 (3) MG/3ML SOLN Take 3 mLs by nebulization every 4 (four) hours as needed for wheezing. 01/31/17  Yes [provider]  isosorbide mononitrate (IMDUR) 30 MG 24 hr tablet Take 1 tablet (30 mg total) by mouth daily. 02/06/17  Yes Helberg, Larkin Ina, MD  LANTUS SOLOSTAR 100 UNIT/ML Solostar Pen Inject 16 Units into the skin every morning.  01/09/16  Yes [provider]  latanoprost (XALATAN) 0.005 % ophthalmic solution Place 1 drop into both eyes at bedtime. 02/15/16  Yes [provider]  levothyroxine (SYNTHROID, LEVOTHROID) 75 MCG tablet Take 75 mcg by mouth daily before breakfast.   Yes [provider]  losartan (COZAAR) 100 MG tablet Take 100 mg by mouth daily. 10/13/16  Yes [provider]  metoprolol tartrate (LOPRESSOR) 25 MG tablet Take 12.5 mg by mouth 2 (two) times daily.  01/09/16  Yes [provider]  pioglitazone (ACTOS) 15 MG tablet Take 15 mg by mouth daily.  07/21/16  Yes [provider]  Potassium Chloride ER 20 MEQ TBCR Take 20 mEq by mouth daily. TAKE EXTRA TABLET WITH FLUID TABLET  IF GAIN  3 LBS IN 24 HOURS 5 LBS  IN A WEEK Patient taking differently: Take 20 mEq by mouth 2 (two) times daily. TAKE EXTRA TABLET WITH FLUID TABLET  IF GAIN  3 LBS IN 24 HOURS 5 LBS  IN A WEEK 07/16/16  Yes Imogene Burn, PA-C  valsartan (DIOVAN) 320 MG tablet Take 320 mg by mouth daily.    Yes [provider]   Allergies  Allergen Reactions  .  Zithromax [Azithromycin] Other (See Comments)    Noted red enhancement of vein distal to IV insertion site after several minutes of IV drug infusion- perfect outline of vein branch- no itching or edema   Review of Systems  Denies complaints today  Physical Exam  General: Alert, awake, in no acute distress. Sitting in bedside chair HEENT: No goiter, + JVD Resp: No increased WOB Abdomen: Obese, nondistended.  Ext: Trace edema Skin: Warm and dry Neuro: Grossly intact, nonfocal.   Vital Signs: BP (!) 118/52 (BP Location: Right Arm)   Pulse 64   Temp 98.2 F (36.8 C) (Oral)   Resp 18   Ht 5' 5"  (1.651 m)   Wt 82.2 kg (181 lb 3.2 oz)   SpO2 100%   BMI 30.15 kg/m  Pain Assessment: No/denies pain   Pain Score: 0-No pain   SpO2: SpO2: 100 % O2 Device:SpO2: 100 % O2 Flow Rate: .O2 Flow Rate (L/min): 2.5 L/min  IO: Intake/output summary:   Intake/Output Summary (Last 24 hours) at 03/11/2017 1533 Last data filed at 03/11/2017 0433 Gross per 24 hour  Intake 290 ml  Output 200 ml  Net 90 ml    LBM: Last BM Date: 03/10/17 Baseline Weight: Weight: 81.6 kg (180 lb) Most recent weight: Weight: 82.2 kg (181 lb 3.2 oz)     Palliative Assessment/Data:   Flowsheet Rows     Most Recent Value  Intake Tab  Referral Department  Hospitalist  Unit at Time of Referral  Med/Surg Unit  Palliative Care Primary Diagnosis  Cardiac  Date Notified  03/10/17  Palliative Care Type  Return patient Palliative Care  Reason for referral  Clarify Goals of Care  Date of Admission  03/09/17  Date first seen by Palliative Care  03/11/17  # of days Palliative referral response time  1 Day(s)  # of days IP prior to Palliative referral  1  Clinical Assessment  Palliative Performance Scale Score  50%  Pain Max last 24 hours  0  Pain Min Last 24 hours  0  Psychosocial & Spiritual Assessment  Palliative Care Outcomes  Patient/Family  meeting held?  No      Time In: 1445 Time Out: 1530 Time Total: 45 Greater than 50%  of this time was spent counseling and coordinating care related to the above assessment and plan.  Signed by: Micheline Rough, MD   Please contact Palliative Medicine Team phone at [phone] for questions and concerns.  For individual provider: See Shea Evans

## 2017-03-11 NOTE — Progress Notes (Signed)
Progress Note  Patient Name: Bethany Nelson Date of Encounter: 03/11/2017  Primary Cardiologist: Eldridge DaceVaranasi  Subjective   No chest pain. She reports that her breathing is better.   Inpatient Medications    Scheduled Meds: . aspirin EC  81 mg Oral QODAY  . atorvastatin  40 mg Oral Daily  . cromolyn  1 drop Both Eyes QID  . donepezil  5 mg Oral Daily  . dorzolamide-timolol  1 drop Both Eyes BID  . escitalopram  10 mg Oral Daily  . furosemide  80 mg Intravenous Q12H  . guaiFENesin  600 mg Oral BID  . heparin injection (subcutaneous)  5,000 Units Subcutaneous Q8H  . insulin aspart  0-5 Units Subcutaneous QHS  . insulin aspart  0-9 Units Subcutaneous TID WC  . insulin glargine  16 Units Subcutaneous BH-q7a  . ipratropium-albuterol  3 mL Nebulization Q6H  . latanoprost  1 drop Both Eyes QHS  . loratadine  10 mg Oral Daily  . methylPREDNISolone (SOLU-MEDROL) injection  60 mg Intravenous Q12H  . sodium chloride flush  3 mL Intravenous Q12H   Continuous Infusions: . sodium chloride    . ceFEPime (MAXIPIME) IV Stopped (03/10/17 1602)  . vancomycin     PRN Meds: sodium chloride, acetaminophen **OR** acetaminophen, albuterol, calcium carbonate (dosed in mg elemental calcium), camphor-menthol **AND** hydrOXYzine, docusate sodium, feeding supplement (NEPRO CARB STEADY), ondansetron **OR** ondansetron (ZOFRAN) IV, sodium chloride flush, sorbitol, zolpidem   Vital Signs    Vitals:   03/10/17 2034 03/10/17 2039 03/11/17 0201 03/11/17 0427  BP: (!) 120/50   124/67  Pulse: 67   65  Resp: 18   18  Temp: 98.2 F (36.8 C)   97.6 F (36.4 C)  TempSrc: Oral   Oral  SpO2: 97% 96% 95% 100%  Weight:    181 lb 3.2 oz (82.2 kg)  Height:        Intake/Output Summary (Last 24 hours) at 03/11/2017 0850 Last data filed at 03/11/2017 0433 Gross per 24 hour  Intake 770 ml  Output 500 ml  Net 270 ml   Filed Weights   03/09/17 1533 03/10/17 0346 03/11/17 0427  Weight: 179 lb (81.2 kg) 181 lb  (82.1 kg) 181 lb 3.2 oz (82.2 kg)    Telemetry    Sinus - Personally Reviewed  ECG    No AM EKG  Physical Exam   GEN: Pleasant, elderly femal in no acute distress.   Neck: + JVD Cardiac: RRR with systolic murmur noted but difficult to characterize given loud pulmonary sounds.   Respiratory: Diffuse rhonci with scattered wheezes. Bibasilar crackles.  GI: Soft, nontender, non-distended  MS: No LE  edema Neuro:  Nonfocal  Psych: Normal affect   Labs    Chemistry Recent Labs  Lab 03/09/17 1029 03/10/17 0425  NA 136 141  K 4.5 3.9  CL 101 102  CO2 23 28  GLUCOSE 280* 88  BUN 76* 78*  CREATININE 2.90* 3.03*  CALCIUM 9.0 8.4*  PROT 6.4*  --   ALBUMIN 3.1*  --   AST 37  --   ALT 25  --   ALKPHOS 40  --   BILITOT 0.9  --   GFRNONAA 13* 13*  GFRAA 16* 15*  ANIONGAP 12 11     Hematology Recent Labs  Lab 03/09/17 1029  WBC 6.0  RBC 3.35*  HGB 10.3*  HCT 33.5*  MCV 100.0  MCH 30.7  MCHC 30.7  RDW 15.6*  PLT 105*  Cardiac Enzymes Recent Labs  Lab 03/09/17 1029  TROPONINI 0.26*   No results for input(s): TROPIPOC in the last 168 hours.   BNP Recent Labs  Lab 03/09/17 1029  BNP 4,093.2*     DDimer No results for input(s): DDIMER in the last 168 hours.   Radiology    Dg Chest 2 View  Result Date: 03/10/2017 CLINICAL DATA:  Heart failure EXAM: CHEST  2 VIEW COMPARISON:  03/09/2017 FINDINGS: Cardiac shadow is enlarged but stable. Increasing vascular congestion with interstitial edema is noted consistent with congestive failure. Aortic calcifications and mitral valve calcifications are again seen. Persistent left lower lobe infiltrate is noted. IMPRESSION: Increasing congestive failure. Electronically Signed   By: Alcide Clever M.D.   On: 03/10/2017 09:29   Dg Chest 2 View  Result Date: 03/09/2017 CLINICAL DATA:  Shortness of breath and cough for 2 days EXAM: CHEST  2 VIEW COMPARISON:  02/03/2017 FINDINGS: Normal heart size. Aortic atherosclerosis.  Small left pleural effusion. Opacity within the posterior left lower lobe identified compatible is identified on the lateral radiograph. Diffuse chronic interstitial coarsening identified bilaterally. IMPRESSION: 1. Small left pleural effusion and left lower lobe airspace opacity which may represent pneumonia 2.  Aortic Atherosclerosis (ICD10-I70.0). Electronically Signed   By: Signa Kell M.D.   On: 03/09/2017 11:23    Cardiac Studies   Echo 02/03/17: 02/03/2017  - Left ventricle: The cavity size was normal. Wall thickness was increased in a pattern of moderate LVH. Systolic function was severely reduced. The estimated ejection fraction was in the range of 25% to 30%. Diffuse hypokinesis with regional variation. Doppler parameters are consistent with restrictive left ventricular relaxation (grade 3 diastolic dysfunction). The E/e&' ratio is >20, suggesting markedly elevated LV filling pressure. - Aortic valve: Mildly calcified leaflets. There was no stenosis. There was no regurgitation. - Mitral valve: Calcified annulus. Mildly thickened leaflets . There was moderate to severe regurgitation. - Left atrium: The atrium was moderately dilated. - Right ventricle: The cavity size was mildly dilated. The moderator band was prominent. Systolic function was normal. - Right atrium: The atrium was at the upper limits of normal in size. - Atrial septum: No defect or patent foramen ovale was identified. - Tricuspid valve: There was moderate regurgitation. - Pulmonary arteries: PA peak pressure: 76 mm Hg (S). - Inferior vena cava: The vessel was dilated. The respirophasic diameter changes were blunted (<50%), consistent with elevated central venous pressure.  Impressions:  - Compared to a prior study in 06/2016, the LVEF has decreased from 60-65% down to 25-30%, there is grade 3 DD with very high biventricular filling pressures, moderate to severe MR,  moderate LAE, mild RVE and upper normal RA size, moderate TR, and severe pulmonary hypertension with an RVSP of 76 mmHg.    Patient Profile     84 y.o. female with history of dementia, chronic combined systolic and diastolic CHF, HTN, stage 4 CKD, DM, severe mitral regurgitation, pulmonary HTN and HLD admitted with progressively worsening dyspnea, cough, orthopnea and felt to have bronchitis with possible pneumonia and volume overload.   Assessment & Plan    1. Acute on chronic combined systolic and diastolic CHF: Echo last admission November 2018 with LVEF=25-30%. She is admitted with acute respiratory infection and volume overload. Her weight is stable from discharge 6 weeks ago but her BNP is elevated and there is evidence of CHF on her chest x-ray yesterday. She has been on IV Lasix and she is net positive. She will  likely be difficult to diurese given her renal dysfunction. Her CHF is complicated by severe MR and pulmonary HTN. She is not a surgical candidate given advanced age and dementia.  -I would continue aggressive diuresis for now with IV Lasix. If she does not respond to this, we can get the Advanced Heart Failure team involved. I will discuss with her primary cardiologist Dr. Eldridge Dace.  It seems that Hospice/palliative care is the right approach in this patient.   2. Severe MR/Pulm HTN: She is not a candidate for surgery.   3. Elevated troponin: Likely demand ischemia in the setting of acute illness.   For questions or updates, please contact [facility] Please consult [email or web address] for contact info under Cardiology/STEMI.      Signed, Verne Carrow, MD  03/11/2017, 8:50 AM

## 2017-03-11 NOTE — Progress Notes (Signed)
Palliative Medicine consult noted. Due to high referral volume, there may be a delay seeing this patient. Please call the Palliative Medicine Team office at [phone] if recommendations are needed in the interim.  Thank you for inviting us to see this patient.  Shadana Pry G. Gatha Mcnulty, RN, BSN, CHPN 03/11/2017 8:28 AM Office [phone] 

## 2017-03-11 NOTE — Evaluation (Signed)
Occupational Therapy Evaluation Patient Details Name: Bethany OhmsMargie M Magnan MRN: [id number] DOB: 01/20/1927 Today's Date: 03/11/2017    History of Present Illness 84 year old female with PMH of her IDDM, dementia, stage IV chronic kidney disease, acute on chronic systolic and diastolic CHF in the context of severe MR and pulmonary hypertension, not surgical candidate, recently hospitalized and discharged 02/06/17 when home hospice was recommended which family declined, presented with worsening dyspnea, PND cough without fever or chills and wheezing. Admitted for multifactorial dyspnea due to decompensated CHF, possible pneumonia and acute viral bronchitis. Cardiology and palliative care medicine consulted.   Clinical Impression   Pt admitted with above. She demonstrates the below listed deficits and will benefit from continued OT to maximize safety and independence with BADLs.  Pt presents to OT with generalized weakness, decreased activity tolerance, impaired balance, decreased cognition.  She requires mod- total A for ADLs, and min A for functional transfers.  She lives with daughter at home and requires assist with ADLs.  Plan is to return home with assist from family.  Recommend HHOT and PT, as well as HHaide.  Will follow.       Follow Up Recommendations  Home health OT;Supervision/Assistance - 24 hour;Other (comment)(aide )    Equipment Recommendations  None recommended by OT    Recommendations for Other Services       Precautions / Restrictions Precautions Precautions: Fall      Mobility Bed Mobility Overal bed mobility: Needs Assistance Bed Mobility: Supine to Sit     Supine to sit: Min assist     General bed mobility comments: assist to move LEs off bed and to lift trunk   Transfers Overall transfer level: Needs assistance Equipment used: Rolling walker (2 wheeled) Transfers: Stand Pivot Transfers;Sit to/from Stand Sit to Stand: Min assist Stand pivot transfers: Min  assist       General transfer comment: assist to steady     Balance Overall balance assessment: Needs assistance Sitting-balance support: No upper extremity supported;Feet supported Sitting balance-Leahy Scale: Fair     Standing balance support: Bilateral upper extremity supported;During functional activity Standing balance-Leahy Scale: Poor Standing balance comment: relies on UE support and needed cues to stand tall as she flexes as she fatigues.  Had to stand for 5 min to change Dependsand clean pt.  Pt needed mod assist at times for balance.  Pt was able to step into new Depends but needing mod assist for sfaety with RW.                            ADL either performed or assessed with clinical judgement   ADL Overall ADL's : Needs assistance/impaired Eating/Feeding: Set up;Bed level   Grooming: Wash/dry hands;Wash/dry face;Oral care;Supervision/safety;Set up;Sitting   Upper Body Bathing: Moderate assistance;Sitting   Lower Body Bathing: Total assistance;Sit to/from stand   Upper Body Dressing : Moderate assistance;Sitting   Lower Body Dressing: Total assistance;Sit to/from stand   Toilet Transfer: Minimal assistance;Stand-pivot;BSC;RW StatisticianToilet Transfer Details (indicate cue type and reason): assist to steady  Toileting- Clothing Manipulation and Hygiene: Total assistance;Sit to/from stand Toileting - Clothing Manipulation Details (indicate cue type and reason): Pt incontinent of urine and stool.  Assisted with clean up      Functional mobility during ADLs: Minimal assistance;Rolling walker       Vision         Perception     Praxis      Pertinent Vitals/Pain Pain Assessment:  No/denies pain     Hand Dominance Right   Extremity/Trunk Assessment Upper Extremity Assessment Upper Extremity Assessment: Generalized weakness   Lower Extremity Assessment Lower Extremity Assessment: Defer to PT evaluation   Cervical / Trunk Assessment Cervical / Trunk  Assessment: Kyphotic   Communication Communication Communication: HOH   Cognition Arousal/Alertness: Awake/alert Behavior During Therapy: Flat affect Overall Cognitive Status: History of cognitive impairments - at baseline                                     General Comments  family friend present     Exercises     Shoulder Instructions      Home Living Family/patient expects to be discharged to:: Private residence Living Arrangements: Children Available Help at Discharge: Family;Friend(s);Available 24 hours/day Type of Home: House Home Access: Stairs to enter [employer] of Steps: 1   Home Layout: One level     Bathroom Shower/Tub: Producer, television/film/video: Handicapped height Bathroom Accessibility: Yes How Accessible: Accessible via walker Home Equipment: Emergency planning/management officer - 4 wheels          Prior Functioning/Environment Level of Independence: Needs assistance  Gait / Transfers Assistance Needed: uses a rollator to ambulate ADL's / Homemaking Assistance Needed: needs assist bathing, can dress herself with occasional assist from her daughter   Comments: Amb with rollator        OT Problem List: Decreased strength;Decreased activity tolerance;Impaired balance (sitting and/or standing);Decreased safety awareness;Decreased cognition;Decreased knowledge of use of DME or AE;Cardiopulmonary status limiting activity;Obesity      OT Treatment/Interventions: Self-care/ADL training;DME and/or AE instruction;Therapeutic activities;Cognitive remediation/compensation;Patient/family education;Balance training    OT Goals(Current goals can be found in the care plan section) Acute Rehab OT Goals Patient Stated Goal: to go home  OT Goal Formulation: With patient Time For Goal Achievement: 03/25/17 Potential to Achieve Goals: Good ADL Goals Pt Will Perform Grooming: with min guard assist;standing Pt Will Perform Upper Body Dressing: with  min assist;sitting Pt Will Perform Lower Body Dressing: with mod assist;sit to/from stand Pt Will Transfer to Toilet: with min guard assist;ambulating;regular height toilet;grab bars  OT Frequency: Min 2X/week   Barriers to D/C:            Co-evaluation              AM-PAC PT "6 Clicks" Daily Activity     Outcome Measure Help from another person eating meals?: A Little Help from another person taking care of personal grooming?: A Little Help from another person toileting, which includes using toliet, bedpan, or urinal?: A Lot Help from another person bathing (including washing, rinsing, drying)?: A Lot Help from another person to put on and taking off regular upper body clothing?: A Lot Help from another person to put on and taking off regular lower body clothing?: Total 6 Click Score: 13   End of Session Equipment Utilized During Treatment: Oxygen Nurse Communication: Mobility status  Activity Tolerance: Patient limited by fatigue Patient left: in chair;with call bell/phone within reach;with chair alarm set;with family/visitor present  OT Visit Diagnosis: Unsteadiness on feet (R26.81)                Time: 1610-9604 OT Time Calculation (min): 30 min Charges:  OT General Charges $OT Visit: 1 Visit OT Evaluation $OT Eval Moderate Complexity: 1 Mod OT Treatments $Self Care/Home Management : 8-22 mins G-Codes:     [employer]  Alisha Bacus, OTR/L [phone]   Jeani Hawking M 03/11/2017, 2:35 PM

## 2017-03-12 DIAGNOSIS — Z794 Long term (current) use of insulin: Secondary | ICD-10-CM

## 2017-03-12 DIAGNOSIS — E119 Type 2 diabetes mellitus without complications: Secondary | ICD-10-CM

## 2017-03-12 LAB — BASIC METABOLIC PANEL
Anion gap: 9 (ref 5–15)
BUN: 97 mg/dL — AB (ref 6–20)
CO2: 27 mmol/L (ref 22–32)
CREATININE: 3.02 mg/dL — AB (ref 0.44–1.00)
Calcium: 8.2 mg/dL — ABNORMAL LOW (ref 8.9–10.3)
Chloride: 102 mmol/L (ref 101–111)
GFR, EST AFRICAN AMERICAN: 15 mL/min — AB (ref >60)
GFR, EST NON AFRICAN AMERICAN: 13 mL/min — AB (ref >60)
Glucose, Bld: 196 mg/dL — ABNORMAL HIGH (ref 65–99)
Potassium: 3.7 mmol/L (ref 3.5–5.1)
SODIUM: 138 mmol/L (ref 135–145)

## 2017-03-12 LAB — GLUCOSE, CAPILLARY
GLUCOSE-CAPILLARY: 186 mg/dL — AB (ref 65–99)
Glucose-Capillary: 245 mg/dL — ABNORMAL HIGH (ref 65–99)
Glucose-Capillary: 312 mg/dL — ABNORMAL HIGH (ref 65–99)
Glucose-Capillary: 240 mg/dL — ABNORMAL HIGH (ref 65–99)

## 2017-03-12 LAB — MRSA PCR SCREENING: MRSA by PCR: NEGATIVE

## 2017-03-12 MED ORDER — IPRATROPIUM-ALBUTEROL 0.5-2.5 (3) MG/3ML IN SOLN
3.0000 mL | Freq: Three times a day (TID) | RESPIRATORY_TRACT | Status: DC
Start: 2017-03-12 — End: 2017-03-14
  Administered 2017-03-12 – 2017-03-14 (×7): 3 mL via RESPIRATORY_TRACT
  Filled 2017-03-12 (×7): qty 3

## 2017-03-12 NOTE — Care Management Note (Signed)
Case Management Note  Patient Details  Name: Bethany Nelson MRN: [id number] Date of Birth: 02/02/1927  Subjective/Objective:    History of CHF, Admitted with bronchitis with possible pneumonia and volume overload.          Action/Plan: Prior to admission patient lived at home with daughter.  Home DME: shower seat and walker-4 wheels.  PCP is  [name]. [facility]              Expected Discharge Plan:  Home w Home Health Services  Discharge planning Services  CM Consult  Status of Service:  In process, will continue to follow  Cloyde ReamsKimberly Hema Lanza, BSN, RN Case Manager-Orientation [phone] 2:11 PM

## 2017-03-12 NOTE — Progress Notes (Signed)
Pharmacy Antibiotic Note  Bethany Nelson is a 84 y.o. female admitted on 03/09/2017 with pneumonia.  Pharmacy has been consulted for vancomycin dosing. Also on cefepime per MD - day #4 of antibiotics. Pt is afebrile and WBC is WNL. SCr is elevated but decreased to 3.02.  Plan: Vancomycin 1gm IV 48H Cefepime 1g IV q24h per MD F/u renal fxn, C&S, clinical status, [facility]  Per discussion with Dr. Rhona Leavenshiu, d/c vancomycin today if mrsa pcr neg, otherwise d/c all abx after 1/4 - stop dates in  Height: 5\' 5"  (165.1 cm) Weight: 180 lb 8 oz (81.9 kg) IBW/kg (Calculated) : 57  Temp (24hrs), Avg:98.1 F (36.7 C), Min:98 F (36.7 C), Max:98.2 F (36.8 C)  Recent Labs  Lab 03/09/17 1029 03/09/17 1043 03/10/17 0425 03/11/17 0728 03/12/17 0517  WBC 6.0  --   --  4.4  --   CREATININE 2.90*  --  3.03* 3.22* 3.02*  LATICACIDVEN  --  1.53  --   --   --     Estimated Creatinine Clearance: 13.6 mL/min (A) (by C-G formula based on SCr of 3.02 mg/dL (H)).    Allergies  Allergen Reactions  . Zithromax [Azithromycin] Other (See Comments)    Noted red enhancement of vein distal to IV insertion site after several minutes of IV drug infusion- perfect outline of vein branch- no itching or edema    Antimicrobials this admission: Vanc 12/31>>(1/4) Cefepime 12/31>>(1/4) Azithro x 1 12/31 CTX x 1 12/31  Dose adjustments this admission: N/A  Microbiology results: 12/31 BCx: ngtd 12/31 Coronavirus Positive 1/3 mrsa pcr: pending  Bethany BertinHaley Lukas Nelson, PharmD, BCPS Clinical Pharmacist Clinical phone for 03/12/2017 until 3:30pm: [phone] If after 3:30pm, please call main pharmacy at: [phone] 03/12/2017 11:03 AM

## 2017-03-12 NOTE — Progress Notes (Signed)
Physical Therapy Treatment Patient Details Name: Bethany Nelson MRN: [id number] DOB: 07-Jul-1926 Today's Date: 03/12/2017    History of Present Illness 84 year old female with PMH of her IDDM, dementia, stage IV chronic kidney disease, acute on chronic systolic and diastolic CHF in the context of severe MR and pulmonary hypertension, not surgical candidate, recently hospitalized and discharged 02/06/17 when home hospice was recommended which family declined, presented with worsening dyspnea, PND cough without fever or chills and wheezing. Admitted for multifactorial dyspnea due to decompensated CHF, possible pneumonia and acute viral bronchitis. Cardiology and palliative care medicine consulted.    PT Comments    Patient received in bed with home aide present, pleasant and willing to participate in skilled PT services. Note O2 sats at 96% on room air at rest in bed. Able to complete bed mobility with min assist, however continues to require Mod assist for sit to stand from bed. Ambulated approximately 75f with home aide performing chair follow, patient very limited by fatigue and with poor functional activity tolerance, however O2 on room air remained at 94-96% with gait period. Patient left up in chair with visitor present, chair alarm set, all needs otherwise met. RN and CNA educated on patient performance during session, encouraged to use lift equipment for back to bed this evening due to CNA concerns for difficult transfer.     Follow Up Recommendations  Home health PT;Supervision/Assistance - 24 hour     Equipment Recommendations  None recommended by PT    Recommendations for Other Services       Precautions / Restrictions Precautions Precautions: Fall Restrictions Weight Bearing Restrictions: No    Mobility  Bed Mobility Overal bed mobility: Needs Assistance Bed Mobility: Supine to Sit     Supine to sit: Min assist     General bed mobility comments: assist to move LEs off  bed and to lift trunk   Transfers Overall transfer level: Needs assistance Equipment used: Rolling walker (2 wheeled) Transfers: Sit to/from Stand Sit to Stand: Mod assist         General transfer comment: assist to power trunk up straight, come to full standing position   Ambulation/Gait Ambulation/Gait assistance: Min guard Ambulation Distance (Feet): 10 Feet Assistive device: Rolling walker (2 wheeled) Gait Pattern/deviations: Step-through pattern;Decreased step length - left;Decreased stance time - right;Decreased dorsiflexion - right;Decreased dorsiflexion - left;Drifts right/left;Trunk flexed     General Gait Details: gait distance limited by fatigue, chair follow by patient's home aide    Stairs            Wheelchair Mobility    Modified Rankin (Stroke Patients Only)       Balance Overall balance assessment: Needs assistance Sitting-balance support: Bilateral upper extremity supported;Feet supported Sitting balance-Leahy Scale: Good     Standing balance support: Bilateral upper extremity supported;During functional activity Standing balance-Leahy Scale: Fair Standing balance comment: reliant on UE support, very limited by fatigue                             Cognition Arousal/Alertness: Awake/alert Behavior During Therapy: Flat affect Overall Cognitive Status: History of cognitive impairments - at baseline                                        Exercises      General Comments General comments (skin integrity, edema, etc.):  home aide present during session and assisted with chair follow       Pertinent Vitals/Pain Pain Assessment: No/denies pain    Home Living                      Prior Function            PT Goals (current goals can now be found in the care plan section) Acute Rehab PT Goals Patient Stated Goal: to go home  PT Goal Formulation: With patient Time For Goal Achievement:  03/24/17 Potential to Achieve Goals: Good Progress towards PT goals: Progressing toward goals    Frequency    Min 3X/week      PT Plan Current plan remains appropriate    Co-evaluation              AM-PAC PT "6 Clicks" Daily Activity  Outcome Measure  Difficulty turning over in bed (including adjusting bedclothes, sheets and blankets)?: Unable Difficulty moving from lying on back to sitting on the side of the bed? : Unable Difficulty sitting down on and standing up from a chair with arms (e.g., wheelchair, bedside commode, etc,.)?: Unable Help needed moving to and from a bed to chair (including a wheelchair)?: A Lot Help needed walking in hospital room?: A Lot Help needed climbing 3-5 steps with a railing? : Total 6 Click Score: 8    End of Session Equipment Utilized During Treatment: Gait belt Activity Tolerance: Patient limited by fatigue Patient left: in chair;with chair alarm set;with call bell/phone within reach;with family/visitor present Nurse Communication: Mobility status;Need for lift equipment(RN and CNA educated regarding patient performance and status, PT recommendation for nursing to potentially use lift equipment for back to bed ) PT Visit Diagnosis: Unsteadiness on feet (R26.81);Muscle weakness (generalized) (M62.81)     Time: 4935-5217 PT Time Calculation (min) (ACUTE ONLY): 28 min  Charges:  $Gait Training: 8-22 mins $Therapeutic Activity: 8-22 mins                    G Codes:       Deniece Ree PT, DPT, CBIS  Supplemental Physical Therapist [place]

## 2017-03-12 NOTE — Progress Notes (Signed)
Progress Note  Patient Name: Bethany Nelson Date of Encounter: 03/12/2017  Primary Cardiologist: Dr. Eldridge Dace  Subjective   Pt on room air with [facility] elevated, audible wheezing. She is in good spirits today, home caregiver at bedside.  Inpatient Medications    Scheduled Meds: . aspirin EC  81 mg Oral QODAY  . atorvastatin  40 mg Oral Daily  . cromolyn  1 drop Both Eyes QID  . donepezil  5 mg Oral Daily  . dorzolamide-timolol  1 drop Both Eyes BID  . escitalopram  10 mg Oral Daily  . furosemide  80 mg Intravenous Q12H  . guaiFENesin  600 mg Oral BID  . heparin injection (subcutaneous)  5,000 Units Subcutaneous Q8H  . insulin aspart  0-15 Units Subcutaneous TID WC  . insulin aspart  0-5 Units Subcutaneous QHS  . insulin glargine  21 Units Subcutaneous BH-q7a  . ipratropium-albuterol  3 mL Nebulization Q6H  . latanoprost  1 drop Both Eyes QHS  . loratadine  10 mg Oral Daily  . methylPREDNISolone (SOLU-MEDROL) injection  60 mg Intravenous Q12H  . sodium chloride flush  3 mL Intravenous Q12H   Continuous Infusions: . sodium chloride    . ceFEPime (MAXIPIME) IV Stopped (03/11/17 1649)  . vancomycin 1,000 mg (03/11/17 1707)   PRN Meds: sodium chloride, acetaminophen **OR** acetaminophen, albuterol, calcium carbonate (dosed in mg elemental calcium), camphor-menthol **AND** hydrOXYzine, docusate sodium, feeding supplement (NEPRO CARB STEADY), ondansetron **OR** ondansetron (ZOFRAN) IV, sodium chloride flush, sorbitol, zolpidem   Vital Signs    Vitals:   03/11/17 2046 03/12/17 0240 03/12/17 0559 03/12/17 0926  BP: 134/66  (!) 123/98   Pulse: 70  68   Resp: 18  18   Temp: 98.1 F (36.7 C)  98 F (36.7 C)   TempSrc: Oral  Oral   SpO2: 100% 99% 95% 96%  Weight:   180 lb 8 oz (81.9 kg)   Height:        Intake/Output Summary (Last 24 hours) at 03/12/2017 1007 Last data filed at 03/12/2017 0835 Gross per 24 hour  Intake 603 ml  Output 800 ml  Net -197 ml   Filed Weights   03/10/17 0346 03/11/17 0427 03/12/17 0559  Weight: 181 lb (82.1 kg) 181 lb 3.2 oz (82.2 kg) 180 lb 8 oz (81.9 kg)     Physical Exam   General: Well developed, well nourished, female appearing in no acute distress. Head: Normocephalic, atraumatic.  Neck: Supple without bruits, no JVD but difficult to assess Lungs:  Respirations unlabored, wheezing and coarse sounds throughout Heart: RRR, S1, S2, no murmur; no rub. Abdomen: Soft, non-tender, non-distended with normoactive bowel sounds. No hepatomegaly. No rebound/guarding. No obvious abdominal masses. Extremities: No clubbing, cyanosis, no edema, compression socks in place. Distal pedal pulses are 1+ bilaterally. Neuro: Alert and oriented X 3. Moves all extremities spontaneously. Psych: Normal affect.  Labs    Chemistry Recent Labs  Lab 03/09/17 1029 03/10/17 0425 03/11/17 0728 03/12/17 0517  NA 136 141 139 138  K 4.5 3.9 4.4 3.7  CL 101 102 102 102  CO2 23 28 24 27   GLUCOSE 280* 88 303* 196*  BUN 76* 78* 90* 97*  CREATININE 2.90* 3.03* 3.22* 3.02*  CALCIUM 9.0 8.4* 8.4* 8.2*  PROT 6.4*  --   --   --   ALBUMIN 3.1*  --   --   --   AST 37  --   --   --   ALT 25  --   --   --  ALKPHOS 40  --   --   --   BILITOT 0.9  --   --   --   GFRNONAA 13* 13* 12* 13*  GFRAA 16* 15* 14* 15*  ANIONGAP 12 11 13 9      Hematology Recent Labs  Lab 03/09/17 1029 03/11/17 0728  WBC 6.0 4.4  RBC 3.35* 3.20*  HGB 10.3* 9.8*  HCT 33.5* 32.4*  MCV 100.0 101.3*  MCH 30.7 30.6  MCHC 30.7 30.2  RDW 15.6* 15.0  PLT 105* 102*    Cardiac Enzymes Recent Labs  Lab 03/09/17 1029  TROPONINI 0.26*   No results for input(s): TROPIPOC in the last 168 hours.   BNP Recent Labs  Lab 03/09/17 1029  BNP 4,093.2*     DDimer No results for input(s): DDIMER in the last 168 hours.   Radiology    Dg Chest Port 1 View  Result Date: 03/11/2017 CLINICAL DATA:  84 year old female with acute respiratory failure. Subsequent encounter. EXAM:  PORTABLE CHEST 1 VIEW COMPARISON:  03/10/2017. FINDINGS: Interval decrease in degree of pulmonary vascular congestion. Cardiomegaly.  Mitral valve calcification. Calcified tortuous aorta. Bilateral shoulder joint degenerative changes. IMPRESSION: Interval decrease in degree of pulmonary vascular congestion. Cardiomegaly. Aortic Atherosclerosis (ICD10-I70.0). Electronically Signed   By: Lacy Duverney M.D.   On: 03/11/2017 18:13     Telemetry    Sinus rhythm - Personally Reviewed  ECG    No new tracings - Personally Reviewed   Cardiac Studies   Echo 02/03/17: 02/03/2017 - Left ventricle: The cavity size was normal. Wall thickness was increased in a pattern of moderate LVH. Systolic function was severely reduced. The estimated ejection fraction was in the range of 25% to 30%. Diffuse hypokinesis with regional variation. Doppler parameters are consistent with restrictive left ventricular relaxation (grade 3 diastolic dysfunction). The E/e&' ratio is >20, suggesting markedly elevated LV filling pressure. - Aortic valve: Mildly calcified leaflets. There was no stenosis. There was no regurgitation. - Mitral valve: Calcified annulus. Mildly thickened leaflets . There was moderate to severe regurgitation. - Left atrium: The atrium was moderately dilated. - Right ventricle: The cavity size was mildly dilated. The moderator band was prominent. Systolic function was normal. - Right atrium: The atrium was at the upper limits of normal in size. - Atrial septum: No defect or patent foramen ovale was identified. - Tricuspid valve: There was moderate regurgitation. - Pulmonary arteries: PA peak pressure: 76 mm Hg (S). - Inferior vena cava: The vessel was dilated. The respirophasic diameter changes were blunted (<50%), consistent with elevated central venous pressure.  Impressions: - Compared to a prior study in 06/2016, the LVEF has decreased from 60-65% down to  25-30%, there is grade 3 DD with very high biventricular filling pressures, moderate to severe MR, moderate LAE, mild RVE and upper normal RA size, moderate TR, and severe pulmonary hypertension with an RVSP of 76 mmHg.   Patient Profile     84 y.o. female  with history of dementia, chronic combined systolic and diastolic CHF, HTN, stage 4 CKD, DM, severe mitral regurgitation, pulmonary HTN and HLD admitted with progressively worsening dyspnea, cough, orthopnea and felt to have bronchitis with possible pneumonia and volume overload.   Assessment & Plan    1. Acute on chronic combined systolic and diastolic heart failure - she is diuresing on 80 mg IV lasix BID - she is overall net positive 630 cc with 800 cc urine output yesterday - renal function is stable: sCr 3.02 (3.22,  3.03) - would continue this level of diuresis today with daily labs - K is stable at 3.7 - pt is resting comfortably on room air with HOB at 45 degrees - encouraged flutter valve use and asked nursing to order IS - no medication changes today, will continue diuresing on current regimen - appreciate palliative care recs, although per their note family is not interested in pursuing palliative care at this time  2. Severe MR/pulmonary HTN - not a candidate for surgery  3. elevated troponin  - thought to be demand ischemia in the setting of acute illness   Signed, Marcelino Dusterngela Nicole Duke , PA-C 10:07 AM 03/12/2017 Pager: [phone]  I have personally seen and examined this patient with Bettina GaviaAngie Duke, PA. I agree with the assessment and plan as outlined above. She remains dyspneic and her pulmonary exam still with diffuse wheezes and rhonci. She has no LE edema and her weight is stable, actually down from where she was several months ago. She may still have mild volume overload. It seems that her acute pulmonary issues are driving her dyspnea. I think it is reasonable to continue IV Lasix today. Probably change to po  Lasix tomorrow. REnal function is stable.   Verne CarrowChristopher McAlhany 03/12/2017 10:32 AM

## 2017-03-12 NOTE — Progress Notes (Signed)
PROGRESS NOTE    Bethany Nelson  ZOX:[id number] DOB: [birth year] DOA: 03/09/2017 PCP: Jerl Mina, MD    Brief Narrative:  84 year old female with PMH of her IDDM, dementia, stage IV chronic kidney disease, acute on chronic systolic and diastolic CHF in the context of severe MR and pulmonary hypertension, not surgical candidate, recently hospitalized and discharged 02/06/17 when home hospice was recommended which family declined, presented with worsening dyspnea, PND cough without fever or chills and wheezing. Admitted for multifactorial dyspnea due to decompensated CHF, possible pneumonia and acute viral bronchitis. Cardiology and palliative care medicine consulted    Assessment & Plan:   Principal Problem:   Acute on chronic combined systolic and diastolic heart failure (HCC) Active Problems:   Acute respiratory failure with hypoxemia (HCC)   Severe mitral regurgitation   Moderate to severe pulmonary hypertension (HCC)   Dementia   Diabetes mellitus type 2, insulin dependent (HCC)   CKD (chronic kidney disease) stage 4, GFR 15-29 ml/min (HCC)   HCAP (healthcare-associated pneumonia)  1. Acute on chronic combined systolic and diastolic CHF with moderate to severe pulmonary hypertension secondary to severe mitral regurgitation: TTE during recent admission: LVEF 25-30 percent, grade 3 diastolic dysfunction, moderate to severe mitral regurgitation and severe pulmonary hypertension. Not surgical candidate for MR surgery. At last admission, Home hospice was recommended but family declined. BNP 4100. Started Lasix 80 mg IV every 12 hours. Intake output not accurate/+[id number]. Not on ACEI/ARB due to advanced chronic kidney disease. Cardiology input appreciated, continue aggressive diuresis. Chest x-ray personally reviewed and shows worsening CHF. Dr. Waymon Amato had discussed with Dr. Clifton James, Cardiology who agrees that patient is hospice appropriate. Family has declined discussing case with  Palliative Care 2. Lobar pneumonia (LLL): Recently started on oral Augmentin by PCP. Continue empirically started IV cefepime and vancomycin.  1. CXR from 1/2 reviewed. Interval decrease in vascular congestion 3. Acute viral bronchitis with bronchospasm: RSV positive for coronavirus. Significant bronchospasm could be due to CHF, pneumonia or acute viral bronchitis. Bronchodilator nebulizations, IV Solu-Medrol 60 mg every 12 hourly. Continue supportive treatment. Seems slightly better. 4. Dyspnea/? Acute respiratory failure with hypoxia: Although dyspneic, no hypoxia documented since admission. Secondary to above etiology. Oxygen supplementation as needed. Seems slightly better but still at high risk for decompensation. 5. Elevated troponin: Multifactorial related to demand ischemia from respiratory distress, CHF, CKD 4. Cardiology had been made aware. 6. IDDM: Steroids causing worsening hyperglycemia. Increase Lantus to 21 units daily. Change sliding scale to moderate sensitivity. Continue to monitor. 7. Acute on Stage IV chronic kidney disease: Baseline creatinine may be in the 1.8-1.9 range. However this may have changed during recent hospitalization when creatinine ranged between 2.6-2.7. Presented with creatinine of 2.9. Likely related to cardiorenal and diuretics. Follow closely. Not candidate for dialysis. Creatinine gradually trending upward however pt would benefit from continued diuresis. Agreed that this is a difficult situation. 8. Essential hypertension: Controlled. Amlodipine held. 9. Dementia: Continue Aricept. Mental status may be at baseline. Stable 10. Dyslipidemia: Statins. 11. Adult failure to thrive: Multifactorial due to advanced age, dementia and multiple severe significant comorbidities.  12. Anemia: Likely due to chronic disease and chronic kidney disease. Stable at this time 13. Thrombocytopenia, chronic: Stable.   DVT prophylaxis: Heparin subQ Code Status: Full Family  Communication: Pt in room Disposition Plan: Uncertain at this time  Consultants:   Cardiology  Palliative Care  Procedures:     Antimicrobials: Anti-infectives (From admission, onward)   Start     Dose/Rate Route  Frequency Ordered Stop   03/11/17 1800  vancomycin (VANCOCIN) IVPB 1000 mg/200 mL premix  Status:  Discontinued     1,000 mg 200 mL/hr over 60 Minutes Intravenous Every 48 hours 03/09/17 1502 03/12/17 1431   03/09/17 1530  ceFEPIme (MAXIPIME) 1 g in dextrose 5 % 50 mL IVPB     1 g 100 mL/hr over 30 Minutes Intravenous Every 24 hours 03/09/17 1456 03/17/17 1529   03/09/17 1515  vancomycin (VANCOCIN) 1,500 mg in sodium chloride 0.9 % 500 mL IVPB     1,500 mg 250 mL/hr over 120 Minutes Intravenous  Once 03/09/17 1502 03/09/17 2129   03/09/17 1245  cefTRIAXone (ROCEPHIN) 1 g in dextrose 5 % 50 mL IVPB     1 g 100 mL/hr over 30 Minutes Intravenous  Once 03/09/17 1241 03/09/17 1347   03/09/17 1245  azithromycin (ZITHROMAX) 500 mg in dextrose 5 % 250 mL IVPB  Status:  Discontinued     500 mg 250 mL/hr over 60 Minutes Intravenous  Once 03/09/17 1241 03/09/17 1529       Subjective: Without complaints at this time  Objective: Vitals:   03/12/17 0559 03/12/17 0926 03/12/17 1442 03/12/17 1538  BP: (!) 123/98  (!) 131/55   Pulse: 68  80   Resp: 18  18   Temp: 98 F (36.7 C)  98.3 F (36.8 C)   TempSrc: Oral  Oral   SpO2: 95% 96% 96% 95%  Weight: 81.9 kg (180 lb 8 oz)     Height:        Intake/Output Summary (Last 24 hours) at 03/12/2017 1626 Last data filed at 03/12/2017 1553 Gross per 24 hour  Intake 653 ml  Output 800 ml  Net -147 ml   Filed Weights   03/10/17 0346 03/11/17 0427 03/12/17 0559  Weight: 82.1 kg (181 lb) 82.2 kg (181 lb 3.2 oz) 81.9 kg (180 lb 8 oz)    Examination:  General exam: Appears calm and comfortable  Respiratory system: coarse breath sounds, mildly increased resp effort Cardiovascular system: S1 & S2 heard, RRR Gastrointestinal  system: Abdomen is nondistended, soft and nontender. No organomegaly or masses felt. Normal bowel sounds heard. Central nervous system: Alert and oriented. No focal neurological deficits. Extremities: Symmetric 5 x 5 power. Skin: No rashes, lesions  Psychiatry: Judgement and insight appear normal. Mood & affect appropriate.   Data Reviewed: I have personally reviewed following labs and imaging studies  CBC: Recent Labs  Lab 03/09/17 1029 03/11/17 0728  WBC 6.0 4.4  NEUTROABS 5.0  --   HGB 10.3* 9.8*  HCT 33.5* 32.4*  MCV 100.0 101.3*  PLT 105* 102*   Basic Metabolic Panel: Recent Labs  Lab 03/09/17 1029 03/09/17 1609 03/10/17 0425 03/11/17 0728 03/12/17 0517  NA 136  --  141 139 138  K 4.5  --  3.9 4.4 3.7  CL 101  --  102 102 102  CO2 23  --  28 24 27   GLUCOSE 280*  --  88 303* 196*  BUN 76*  --  78* 90* 97*  CREATININE 2.90*  --  3.03* 3.22* 3.02*  CALCIUM 9.0  --  8.4* 8.4* 8.2*  MG  --  2.6*  --   --   --    GFR: Estimated Creatinine Clearance: 13.6 mL/min (A) (by C-G formula based on SCr of 3.02 mg/dL (H)). Liver Function Tests: Recent Labs  Lab 03/09/17 1029  AST 37  ALT 25  ALKPHOS 40  BILITOT  0.9  PROT 6.4*  ALBUMIN 3.1*   No results for input(s): LIPASE, AMYLASE in the last 168 hours. No results for input(s): AMMONIA in the last 168 hours. Coagulation Profile: No results for input(s): INR, PROTIME in the last 168 hours. Cardiac Enzymes: Recent Labs  Lab 03/09/17 1029  TROPONINI 0.26*   BNP (last 3 results) Recent Labs    07/03/16 1350  PROBNP 4,153*   HbA1C: No results for input(s): HGBA1C in the last 72 hours. CBG: Recent Labs  Lab 03/11/17 1153 03/11/17 1649 03/11/17 2125 03/12/17 0743 03/12/17 1148  GLUCAP 328* 239* 223* 186* 245*   Lipid Profile: No results for input(s): CHOL, HDL, LDLCALC, TRIG, CHOLHDL, LDLDIRECT in the last 72 hours. Thyroid Function Tests: No results for input(s): TSH, T4TOTAL, FREET4, T3FREE,  THYROIDAB in the last 72 hours. Anemia Panel: No results for input(s): VITAMINB12, FOLATE, FERRITIN, TIBC, IRON, RETICCTPCT in the last 72 hours. Sepsis Labs: Recent Labs  Lab 03/09/17 1043 03/09/17 1609  PROCALCITON  --  <0.10  LATICACIDVEN 1.53  --     Recent Results (from the past 240 hour(s))  Culture, blood (routine x 2) Call MD if unable to obtain prior to antibiotics being given     Status: None (Preliminary result)   Collection Time: 03/09/17  4:24 PM  Result Value Ref Range Status   Specimen Description BLOOD LEFT HAND  Final   Special Requests   Final    BOTTLES DRAWN AEROBIC AND ANAEROBIC Blood Culture adequate volume   Culture NO GROWTH 3 DAYS  Final   Report Status PENDING  Incomplete  Culture, blood (routine x 2) Call MD if unable to obtain prior to antibiotics being given     Status: None (Preliminary result)   Collection Time: 03/09/17  4:24 PM  Result Value Ref Range Status   Specimen Description BLOOD RIGHT HAND  Final   Special Requests   Final    BOTTLES DRAWN AEROBIC AND ANAEROBIC Blood Culture adequate volume   Culture NO GROWTH 3 DAYS  Final   Report Status PENDING  Incomplete  Respiratory Panel by PCR     Status: Abnormal   Collection Time: 03/09/17  4:47 PM  Result Value Ref Range Status   Adenovirus NOT DETECTED NOT DETECTED Final   Coronavirus 229E DETECTED (A) NOT DETECTED Final   Coronavirus HKU1 NOT DETECTED NOT DETECTED Final   Coronavirus NL63 NOT DETECTED NOT DETECTED Final   Coronavirus OC43 NOT DETECTED NOT DETECTED Final   Metapneumovirus NOT DETECTED NOT DETECTED Final   Rhinovirus / Enterovirus NOT DETECTED NOT DETECTED Final   Influenza A NOT DETECTED NOT DETECTED Final   Influenza B NOT DETECTED NOT DETECTED Final   Parainfluenza Virus 1 NOT DETECTED NOT DETECTED Final   Parainfluenza Virus 2 NOT DETECTED NOT DETECTED Final   Parainfluenza Virus 3 NOT DETECTED NOT DETECTED Final   Parainfluenza Virus 4 NOT DETECTED NOT DETECTED Final    Respiratory Syncytial Virus NOT DETECTED NOT DETECTED Final   Bordetella pertussis NOT DETECTED NOT DETECTED Final   Chlamydophila pneumoniae NOT DETECTED NOT DETECTED Final   Mycoplasma pneumoniae NOT DETECTED NOT DETECTED Final  MRSA PCR Screening     Status: None   Collection Time: 03/12/17 10:22 AM  Result Value Ref Range Status   MRSA by PCR NEGATIVE NEGATIVE Final    Comment:        The GeneXpert MRSA Assay (FDA approved for NASAL specimens only), is one component of a comprehensive MRSA colonization  surveillance program. It is not intended to diagnose MRSA infection nor to guide or monitor treatment for MRSA infections.      Radiology Studies: Dg Chest Port 1 View  Result Date: 03/11/2017 CLINICAL DATA:  84 year old female with acute respiratory failure. Subsequent encounter. EXAM: PORTABLE CHEST 1 VIEW COMPARISON:  03/10/2017. FINDINGS: Interval decrease in degree of pulmonary vascular congestion. Cardiomegaly.  Mitral valve calcification. Calcified tortuous aorta. Bilateral shoulder joint degenerative changes. IMPRESSION: Interval decrease in degree of pulmonary vascular congestion. Cardiomegaly. Aortic Atherosclerosis (ICD10-I70.0). Electronically Signed   By: Lacy Duverney M.D.   On: 03/11/2017 18:13    Scheduled Meds: . aspirin EC  81 mg Oral QODAY  . atorvastatin  40 mg Oral Daily  . cromolyn  1 drop Both Eyes QID  . donepezil  5 mg Oral Daily  . dorzolamide-timolol  1 drop Both Eyes BID  . escitalopram  10 mg Oral Daily  . furosemide  80 mg Intravenous Q12H  . guaiFENesin  600 mg Oral BID  . heparin injection (subcutaneous)  5,000 Units Subcutaneous Q8H  . insulin aspart  0-15 Units Subcutaneous TID WC  . insulin aspart  0-5 Units Subcutaneous QHS  . insulin glargine  21 Units Subcutaneous BH-q7a  . ipratropium-albuterol  3 mL Nebulization TID  . latanoprost  1 drop Both Eyes QHS  . loratadine  10 mg Oral Daily  . methylPREDNISolone (SOLU-MEDROL) injection   60 mg Intravenous Q12H  . sodium chloride flush  3 mL Intravenous Q12H   Continuous Infusions: . sodium chloride    . ceFEPime (MAXIPIME) IV 1 g (03/12/17 1553)     LOS: 3 days   Rickey Barbara, MD [facility] Pager [phone]  If 7PM-7AM, please contact night-coverage [email or web address] Password [facility] 03/12/2017, 4:26 PM

## 2017-03-13 DIAGNOSIS — I509 Heart failure, unspecified: Secondary | ICD-10-CM

## 2017-03-13 LAB — BASIC METABOLIC PANEL
Anion gap: 10 (ref 5–15)
BUN: 104 mg/dL — AB (ref 6–20)
CHLORIDE: 104 mmol/L (ref 101–111)
CO2: 26 mmol/L (ref 22–32)
CREATININE: 2.93 mg/dL — AB (ref 0.44–1.00)
Calcium: 8.5 mg/dL — ABNORMAL LOW (ref 8.9–10.3)
GFR calc non Af Amer: 13 mL/min — ABNORMAL LOW (ref >60)
GFR, EST AFRICAN AMERICAN: 15 mL/min — AB (ref >60)
Glucose, Bld: 139 mg/dL — ABNORMAL HIGH (ref 65–99)
POTASSIUM: 3.7 mmol/L (ref 3.5–5.1)
SODIUM: 140 mmol/L (ref 135–145)

## 2017-03-13 LAB — GLUCOSE, CAPILLARY
GLUCOSE-CAPILLARY: 131 mg/dL — AB (ref 65–99)
GLUCOSE-CAPILLARY: 252 mg/dL — AB (ref 65–99)
GLUCOSE-CAPILLARY: 243 mg/dL — AB (ref 65–99)
Glucose-Capillary: 180 mg/dL — ABNORMAL HIGH (ref 65–99)

## 2017-03-13 MED ORDER — FUROSEMIDE 80 MG PO TABS
80.0000 mg | ORAL_TABLET | Freq: Two times a day (BID) | ORAL | Status: DC
  Administered 2017-03-13 – 2017-03-14 (×2): 80 mg via ORAL
  Filled 2017-03-13 (×2): qty 1

## 2017-03-13 NOTE — Progress Notes (Signed)
PROGRESS NOTE    Bethany Nelson  ZOX:[id number] DOB: 12-31-1926 DOA: 03/09/2017 PCP: Jerl Mina, MD    Brief Narrative:  84 year old female with PMH of her IDDM, dementia, stage IV chronic kidney disease, acute on chronic systolic and diastolic CHF in the context of severe MR and pulmonary hypertension, not surgical candidate, recently hospitalized and discharged 02/06/17 when home hospice was recommended which family declined, presented with worsening dyspnea, PND cough without fever or chills and wheezing. Admitted for multifactorial dyspnea due to decompensated CHF, possible pneumonia and acute viral bronchitis. Cardiology and palliative care medicine consulted    Assessment & Plan:   Principal Problem:   Acute on chronic combined systolic and diastolic heart failure (HCC) Active Problems:   Acute respiratory failure with hypoxemia (HCC)   Severe mitral regurgitation   Moderate to severe pulmonary hypertension (HCC)   Dementia   Diabetes mellitus type 2, insulin dependent (HCC)   CKD (chronic kidney disease) stage 4, GFR 15-29 ml/min (HCC)   HCAP (healthcare-associated pneumonia)  1. Acute on chronic combined systolic and diastolic CHF with moderate to severe pulmonary hypertension secondary to severe mitral regurgitation: TTE during recent admission: LVEF 25-30 percent, grade 3 diastolic dysfunction, moderate to severe mitral regurgitation and severe pulmonary hypertension. Not surgical candidate for MR surgery. At last admission, Home hospice was recommended but family declined. BNP 4100. Patient had been continued on Lasix 80 mg IV every 12 hours, now nearing euvolemic status with lasix transitioned to PO 03/13/17. Not on ACEI/ARB due to advanced chronic kidney disease. Dr. Waymon Amato had discussed with Dr. Clifton James, Cardiology who agrees that patient is hospice appropriate. Family has earlier declined discussing case with Palliative Care this week.  2. Lobar pneumonia (LLL):  Recently started on oral Augmentin by PCP. Continued empirically started IV cefepime and vancomycin.  1. CXR from 1/2 reviewed. Interval decrease in vascular congestion 2. Will complete course of abx on 03/13/17 3. Acute viral bronchitis with bronchospasm: RSV positive for coronavirus. Significant bronchospasm could be due to CHF, pneumonia or acute viral bronchitis. Bronchodilator nebulizations, IV Solu-Medrol 60 mg every 12 hourly. Continue supportive treatment. On minimal O2 support. Will begin weaning steroids soon 4. Dyspnea/? Acute respiratory failure with hypoxia: Although dyspneic, no hypoxia documented since admission. Secondary to above etiology. Oxygen supplementation as needed. Seems slightly better but still at high risk for decompensation. 5. Elevated troponin: Multifactorial related to demand ischemia from respiratory distress, CHF, CKD 4. Cardiology following 6. IDDM: Steroids causing worsening hyperglycemia. Increase Lantus to 21 units daily. Change sliding scale to moderate sensitivity. Continue to monitor. 7. Acute on Stage IV chronic kidney disease: Baseline creatinine may be in the 1.8-1.9 range. However this may have changed during recent hospitalization when creatinine ranged between 2.6-2.7. Presented with creatinine of 2.9. Likely related to cardiorenal and diuretics. Cr had been trending upwards, now improving slightly 8. Essential hypertension: Controlled. Amlodipine was held. 9. Dementia: Continue Aricept. Mental status may be at baseline. Stable at this time 10. Dyslipidemia: Statin continued 11. Adult failure to thrive: Multifactorial due to advanced age, dementia and multiple severe significant comorbidities.  12. Anemia: Likely due to chronic disease and chronic kidney disease. Stable at this time 13. Thrombocytopenia, chronic: Stable at this time.   DVT prophylaxis: Heparin subQ Code Status: Full Family Communication: Pt in room Disposition Plan: Uncertain at this  time  Consultants:   Cardiology  Palliative Care  Procedures:     Antimicrobials: Anti-infectives (From admission, onward)   Start  Dose/Rate Route Frequency Ordered Stop   03/11/17 1800  vancomycin (VANCOCIN) IVPB 1000 mg/200 mL premix  Status:  Discontinued     1,000 mg 200 mL/hr over 60 Minutes Intravenous Every 48 hours 03/09/17 1502 03/12/17 1431   03/09/17 1530  ceFEPIme (MAXIPIME) 1 g in dextrose 5 % 50 mL IVPB     1 g 100 mL/hr over 30 Minutes Intravenous Every 24 hours 03/09/17 1456 03/14/17 1529   03/09/17 1515  vancomycin (VANCOCIN) 1,500 mg in sodium chloride 0.9 % 500 mL IVPB     1,500 mg 250 mL/hr over 120 Minutes Intravenous  Once 03/09/17 1502 03/09/17 2129   03/09/17 1245  cefTRIAXone (ROCEPHIN) 1 g in dextrose 5 % 50 mL IVPB     1 g 100 mL/hr over 30 Minutes Intravenous  Once 03/09/17 1241 03/09/17 1347   03/09/17 1245  azithromycin (ZITHROMAX) 500 mg in dextrose 5 % 250 mL IVPB  Status:  Discontinued     500 mg 250 mL/hr over 60 Minutes Intravenous  Once 03/09/17 1241 03/09/17 1529      Subjective: No complaints at this time  Objective: Vitals:   03/13/17 0850 03/13/17 1025 03/13/17 1308 03/13/17 1327  BP:   127/64   Pulse:   85   Resp:   20   Temp:   98 F (36.7 C)   TempSrc:   Oral   SpO2: 98% 100% 98% 98%  Weight:      Height:        Intake/Output Summary (Last 24 hours) at 03/13/2017 1359 Last data filed at 03/13/2017 1309 Gross per 24 hour  Intake 830 ml  Output 400 ml  Net 430 ml   Filed Weights   03/11/17 0427 03/12/17 0559 03/13/17 0545  Weight: 82.2 kg (181 lb 3.2 oz) 81.9 kg (180 lb 8 oz) 82.4 kg (181 lb 11.2 oz)    Examination: General exam: Awake, laying in bed, in nad Respiratory system: Normal respiratory effort, coarse breath sounds Cardiovascular system: regular rate, s1, s2 Gastrointestinal system: Soft, nondistended, positive BS Central nervous system: CN2-12 grossly intact, strength intact Extremities: Perfused,  no clubbing Skin: Normal skin turgor, no notable skin lesions seen Psychiatry: Mood normal // no visual hallucinations    Data Reviewed: I have personally reviewed following labs and imaging studies  CBC: Recent Labs  Lab 03/09/17 1029 03/11/17 0728  WBC 6.0 4.4  NEUTROABS 5.0  --   HGB 10.3* 9.8*  HCT 33.5* 32.4*  MCV 100.0 101.3*  PLT 105* 102*   Basic Metabolic Panel: Recent Labs  Lab 03/09/17 1029 03/09/17 1609 03/10/17 0425 03/11/17 0728 03/12/17 0517 03/13/17 0556  NA 136  --  141 139 138 140  K 4.5  --  3.9 4.4 3.7 3.7  CL 101  --  102 102 102 104  CO2 23  --  28 24 27 26   GLUCOSE 280*  --  88 303* 196* 139*  BUN 76*  --  78* 90* 97* 104*  CREATININE 2.90*  --  3.03* 3.22* 3.02* 2.93*  CALCIUM 9.0  --  8.4* 8.4* 8.2* 8.5*  MG  --  2.6*  --   --   --   --    GFR: Estimated Creatinine Clearance: 14.1 mL/min (A) (by C-G formula based on SCr of 2.93 mg/dL (H)). Liver Function Tests: Recent Labs  Lab 03/09/17 1029  AST 37  ALT 25  ALKPHOS 40  BILITOT 0.9  PROT 6.4*  ALBUMIN 3.1*  No results for input(s): LIPASE, AMYLASE in the last 168 hours. No results for input(s): AMMONIA in the last 168 hours. Coagulation Profile: No results for input(s): INR, PROTIME in the last 168 hours. Cardiac Enzymes: Recent Labs  Lab 03/09/17 1029  TROPONINI 0.26*   BNP (last 3 results) Recent Labs    07/03/16 1350  PROBNP 4,153*   HbA1C: No results for input(s): HGBA1C in the last 72 hours. CBG: Recent Labs  Lab 03/12/17 1148 03/12/17 1631 03/12/17 2112 03/13/17 0729 03/13/17 1145  GLUCAP 245* 312* 240* 131* 180*   Lipid Profile: No results for input(s): CHOL, HDL, LDLCALC, TRIG, CHOLHDL, LDLDIRECT in the last 72 hours. Thyroid Function Tests: No results for input(s): TSH, T4TOTAL, FREET4, T3FREE, THYROIDAB in the last 72 hours. Anemia Panel: No results for input(s): VITAMINB12, FOLATE, FERRITIN, TIBC, IRON, RETICCTPCT in the last 72 hours. Sepsis  Labs: Recent Labs  Lab 03/09/17 1043 03/09/17 1609  PROCALCITON  --  <0.10  LATICACIDVEN 1.53  --     Recent Results (from the past 240 hour(s))  Culture, blood (routine x 2) Call MD if unable to obtain prior to antibiotics being given     Status: None (Preliminary result)   Collection Time: 03/09/17  4:24 PM  Result Value Ref Range Status   Specimen Description BLOOD LEFT HAND  Final   Special Requests   Final    BOTTLES DRAWN AEROBIC AND ANAEROBIC Blood Culture adequate volume   Culture NO GROWTH 3 DAYS  Final   Report Status PENDING  Incomplete  Culture, blood (routine x 2) Call MD if unable to obtain prior to antibiotics being given     Status: None (Preliminary result)   Collection Time: 03/09/17  4:24 PM  Result Value Ref Range Status   Specimen Description BLOOD RIGHT HAND  Final   Special Requests   Final    BOTTLES DRAWN AEROBIC AND ANAEROBIC Blood Culture adequate volume   Culture NO GROWTH 3 DAYS  Final   Report Status PENDING  Incomplete  Respiratory Panel by PCR     Status: Abnormal   Collection Time: 03/09/17  4:47 PM  Result Value Ref Range Status   Adenovirus NOT DETECTED NOT DETECTED Final   Coronavirus 229E DETECTED (A) NOT DETECTED Final   Coronavirus HKU1 NOT DETECTED NOT DETECTED Final   Coronavirus NL63 NOT DETECTED NOT DETECTED Final   Coronavirus OC43 NOT DETECTED NOT DETECTED Final   Metapneumovirus NOT DETECTED NOT DETECTED Final   Rhinovirus / Enterovirus NOT DETECTED NOT DETECTED Final   Influenza A NOT DETECTED NOT DETECTED Final   Influenza B NOT DETECTED NOT DETECTED Final   Parainfluenza Virus 1 NOT DETECTED NOT DETECTED Final   Parainfluenza Virus 2 NOT DETECTED NOT DETECTED Final   Parainfluenza Virus 3 NOT DETECTED NOT DETECTED Final   Parainfluenza Virus 4 NOT DETECTED NOT DETECTED Final   Respiratory Syncytial Virus NOT DETECTED NOT DETECTED Final   Bordetella pertussis NOT DETECTED NOT DETECTED Final   Chlamydophila pneumoniae NOT  DETECTED NOT DETECTED Final   Mycoplasma pneumoniae NOT DETECTED NOT DETECTED Final  MRSA PCR Screening     Status: None   Collection Time: 03/12/17 10:22 AM  Result Value Ref Range Status   MRSA by PCR NEGATIVE NEGATIVE Final    Comment:        The GeneXpert MRSA Assay (FDA approved for NASAL specimens only), is one component of a comprehensive MRSA colonization surveillance program. It is not intended to diagnose MRSA  infection nor to guide or monitor treatment for MRSA infections.      Radiology Studies: Dg Chest Port 1 View  Result Date: 03/11/2017 CLINICAL DATA:  84 year old female with acute respiratory failure. Subsequent encounter. EXAM: PORTABLE CHEST 1 VIEW COMPARISON:  03/10/2017. FINDINGS: Interval decrease in degree of pulmonary vascular congestion. Cardiomegaly.  Mitral valve calcification. Calcified tortuous aorta. Bilateral shoulder joint degenerative changes. IMPRESSION: Interval decrease in degree of pulmonary vascular congestion. Cardiomegaly. Aortic Atherosclerosis (ICD10-I70.0). Electronically Signed   By: Lacy Duverney M.D.   On: 03/11/2017 18:13    Scheduled Meds: . aspirin EC  81 mg Oral QODAY  . atorvastatin  40 mg Oral Daily  . cromolyn  1 drop Both Eyes QID  . donepezil  5 mg Oral Daily  . dorzolamide-timolol  1 drop Both Eyes BID  . escitalopram  10 mg Oral Daily  . furosemide  80 mg Oral BID  . guaiFENesin  600 mg Oral BID  . heparin injection (subcutaneous)  5,000 Units Subcutaneous Q8H  . insulin aspart  0-15 Units Subcutaneous TID WC  . insulin aspart  0-5 Units Subcutaneous QHS  . insulin glargine  21 Units Subcutaneous BH-q7a  . ipratropium-albuterol  3 mL Nebulization TID  . latanoprost  1 drop Both Eyes QHS  . loratadine  10 mg Oral Daily  . methylPREDNISolone (SOLU-MEDROL) injection  60 mg Intravenous Q12H  . sodium chloride flush  3 mL Intravenous Q12H   Continuous Infusions: . sodium chloride    . ceFEPime (MAXIPIME) IV Stopped  (03/12/17 1623)     LOS: 4 days   Rickey Barbara, MD [facility] Pager [phone]  If 7PM-7AM, please contact night-coverage [email or web address] Password [email or web address] 03/13/2017, 1:59 PM

## 2017-03-13 NOTE — Progress Notes (Signed)
Progress Note  Patient Name: Bethany Nelson Date of Encounter: 03/13/2017  Primary Cardiologist: Dr. Eldridge DaceVaranasi  Subjective   Pt states that she doesn's know if she feels better, but she doesn't feel worse. Briefly spoke with daughter via phone. Home caregiver at bedside.  Inpatient Medications    Scheduled Meds: . aspirin EC  81 mg Oral QODAY  . atorvastatin  40 mg Oral Daily  . cromolyn  1 drop Both Eyes QID  . donepezil  5 mg Oral Daily  . dorzolamide-timolol  1 drop Both Eyes BID  . escitalopram  10 mg Oral Daily  . furosemide  80 mg Intravenous Q12H  . guaiFENesin  600 mg Oral BID  . heparin injection (subcutaneous)  5,000 Units Subcutaneous Q8H  . insulin aspart  0-15 Units Subcutaneous TID WC  . insulin aspart  0-5 Units Subcutaneous QHS  . insulin glargine  21 Units Subcutaneous BH-q7a  . ipratropium-albuterol  3 mL Nebulization TID  . latanoprost  1 drop Both Eyes QHS  . loratadine  10 mg Oral Daily  . methylPREDNISolone (SOLU-MEDROL) injection  60 mg Intravenous Q12H  . sodium chloride flush  3 mL Intravenous Q12H   Continuous Infusions: . sodium chloride    . ceFEPime (MAXIPIME) IV Stopped (03/12/17 1623)   PRN Meds: sodium chloride, acetaminophen **OR** acetaminophen, albuterol, calcium carbonate (dosed in mg elemental calcium), camphor-menthol **AND** hydrOXYzine, docusate sodium, feeding supplement (NEPRO CARB STEADY), ondansetron **OR** ondansetron (ZOFRAN) IV, sodium chloride flush, sorbitol, zolpidem   Vital Signs    Vitals:   03/12/17 2003 03/12/17 2018 03/13/17 0545 03/13/17 0850  BP: (!) 135/56  126/72   Pulse: 69  81   Resp: 18  20   Temp: 97.9 F (36.6 C)  98.4 F (36.9 C)   TempSrc: Oral  Oral   SpO2: 100% 97% 96% 98%  Weight:   181 lb 11.2 oz (82.4 kg)   Height:        Intake/Output Summary (Last 24 hours) at 03/13/2017 0934 Last data filed at 03/13/2017 0819 Gross per 24 hour  Intake 590 ml  Output 900 ml  Net -310 ml   Filed Weights    03/11/17 0427 03/12/17 0559 03/13/17 0545  Weight: 181 lb 3.2 oz (82.2 kg) 180 lb 8 oz (81.9 kg) 181 lb 11.2 oz (82.4 kg)     Physical Exam   General: Well developed, well nourished, female appearing in no acute distress. Head: Normocephalic, atraumatic.  Neck: Supple without bruits, question JVD Lungs:  Coarse sounds and wheezing throughout Heart: RRR, S1, S2, no murmur; no rub but exam difficult s/ lung sounds Abdomen: Soft, non-tender, non-distended with normoactive bowel sounds. No hepatomegaly. No rebound/guarding. No obvious abdominal masses. Extremities: No clubbing, cyanosis, no edema with compression socks in place. Distal pedal pulses are 1+ bilaterally. Neuro: Alert and oriented X 3. Moves all extremities spontaneously. Psych: Normal affect.  Labs    Chemistry Recent Labs  Lab 03/09/17 1029  03/11/17 0728 03/12/17 0517 03/13/17 0556  NA 136   < > 139 138 140  K 4.5   < > 4.4 3.7 3.7  CL 101   < > 102 102 104  CO2 23   < > 24 27 26   GLUCOSE 280*   < > 303* 196* 139*  BUN 76*   < > 90* 97* 104*  CREATININE 2.90*   < > 3.22* 3.02* 2.93*  CALCIUM 9.0   < > 8.4* 8.2* 8.5*  PROT 6.4*  --   --   --   --  ALBUMIN 3.1*  --   --   --   --   AST 37  --   --   --   --   ALT 25  --   --   --   --   ALKPHOS 40  --   --   --   --   BILITOT 0.9  --   --   --   --   GFRNONAA 13*   < > 12* 13* 13*  GFRAA 16*   < > 14* 15* 15*  ANIONGAP 12   < > 13 9 10    < > = values in this interval not displayed.     Hematology Recent Labs  Lab 03/09/17 1029 03/11/17 0728  WBC 6.0 4.4  RBC 3.35* 3.20*  HGB 10.3* 9.8*  HCT 33.5* 32.4*  MCV 100.0 101.3*  MCH 30.7 30.6  MCHC 30.7 30.2  RDW 15.6* 15.0  PLT 105* 102*    Cardiac Enzymes Recent Labs  Lab 03/09/17 1029  TROPONINI 0.26*   No results for input(s): TROPIPOC in the last 168 hours.   BNP Recent Labs  Lab 03/09/17 1029  BNP 4,093.2*     DDimer No results for input(s): DDIMER in the last 168 hours.    Radiology    Dg Chest Port 1 View  Result Date: 03/11/2017 CLINICAL DATA:  84 year old female with acute respiratory failure. Subsequent encounter. EXAM: PORTABLE CHEST 1 VIEW COMPARISON:  03/10/2017. FINDINGS: Interval decrease in degree of pulmonary vascular congestion. Cardiomegaly.  Mitral valve calcification. Calcified tortuous aorta. Bilateral shoulder joint degenerative changes. IMPRESSION: Interval decrease in degree of pulmonary vascular congestion. Cardiomegaly. Aortic Atherosclerosis (ICD10-I70.0). Electronically Signed   By: Lacy Duverney M.D.   On: 03/11/2017 18:13     Telemetry    sinus - Personally Reviewed  ECG    No new tracings - Personally Reviewed   Cardiac Studies   Echo 02/03/17: Study Conclusions - Left ventricle: The cavity size was normal. Wall thickness was   increased in a pattern of moderate LVH. Systolic function was   severely reduced. The estimated ejection fraction was in the   range of 25% to 30%. Diffuse hypokinesis with regional variation.   Doppler parameters are consistent with restrictive left   ventricular relaxation (grade 3 diastolic dysfunction). The E/e&'   ratio is >20, suggesting markedly elevated LV filling pressure. - Aortic valve: Mildly calcified leaflets. There was no stenosis.   There was no regurgitation. - Mitral valve: Calcified annulus. Mildly thickened leaflets .   There was moderate to severe regurgitation. - Left atrium: The atrium was moderately dilated. - Right ventricle: The cavity size was mildly dilated. The   moderator band was prominent. Systolic function was normal. - Right atrium: The atrium was at the upper limits of normal in   size. - Atrial septum: No defect or patent foramen ovale was identified. - Tricuspid valve: There was moderate regurgitation. - Pulmonary arteries: PA peak pressure: 76 mm Hg (S). - Inferior vena cava: The vessel was dilated. The respirophasic   diameter changes were blunted (<  50%), consistent with elevated   central venous pressure.  Impressions: - Compared to a prior study in 06/2016, the LVEF has decreased from 60-65% down to 25-30%, there is grade 3 DD with very high biventricular filling pressures, moderate to severe MR, moderate LAE, mild RVE and upper normal RA size, moderate TR, and severe pulmonary hypertension with an RVSP of 76 mmHg.   Patient  Profile     84 y.o. female with history of dementia, chronic combined systolic and diastolic CHF, HTN, stage 4 CKD, DM, severe mitral regurgitation, pulmonary HTN and HLD admitted with progressively worsening dyspnea, cough, orthopnea and felt to have bronchitis with possible pneumonia and volume overload.  Assessment & Plan    1. Acute on chronic combined systolic and diastolic heart failure - she appears euvolemic on exam - she is diuresing on 80 mg IV lasix BID - she is overall net positive 440 cc with 900 cc urine output yesterday - suspect she is near her baseline and her pulmonary status is largely due to her lung function and COPD and not frank heart failure - will transition lasix to PO dosing today - K stable at 3.7 - renal function 2.93 (3.02) - she is using flutter valve  - she appears stable from a cardiology standpoint  2. Severe MR and pulmonary HTN - not a candidate for surgery  3. Elevated troponin - thought to be demand ischemia in the setting of acute illness   Signed, Marcelino Duster , PA-C 9:34 AM 03/13/2017 Pager: [phone]  I have personally seen and examined this patient. I agree with the assessment and plan as outlined above. She is pleasant and confused. She is not sure if her breathing is worse than baseline. Her volume status seems to be near baseline. Her respiratory issues seem mostly due to her pulmonary issues. Transition to po Lasix today. No other recommendations today. She seems to be near end of life. I would also agree with a palliative care consult but the  patient's family refused this earlier this week.   Verne Carrow 03/13/2017 10:51 AM

## 2017-03-13 NOTE — Care Management Note (Addendum)
Previous note per: Cloyde ReamsKimberly Becton, BSN, RN Case Manager-Orientation 03/12/2017 2:11 PM  Case Management Note  Patient Details  Name: Bethany OhmsMargie M Morejon MRN: [id number] Date of Birth: 12/02/1926  Subjective/Objective:    History of CHF, Admitted with bronchitis with possible pneumonia and volume overload.          Action/Plan: Prior to admission patient lived at home with daughter.  Home DME: shower seat and walker-4 wheels.  PCP is  [name]. [facility]: [facility].              Expected Discharge Plan:  Home w Home Health Services  Discharge planning Services  CM Consult  Status of Service:  In process, will continue to follow ---- 03/13/2017 1:50PM In to speak with patient, Caregiver "Elease Hashimotoatricia" at the bedside. Has a scale and weighs daily.  Daughter does the cooking and uses no salt; patient does not eat salt.  Uses [facility] on [address] [place].  Denies inability to afford medications or food.  Patient has uses HH Agency: [facility] and would like to use them again.  NCM will continue to follow for discharge needs.  Cloyde ReamsKimberly Becton, BSN, RN Case Manager-Orientation [phone] 03/13/2017 1:50 PM

## 2017-03-13 NOTE — Progress Notes (Signed)
Occupational Therapy Treatment Patient Details Name: Bethany Nelson MRN: [id number] DOB: 05/19/1926 Today's Date: 03/13/2017    History of present illness 84 year old female with PMH of her IDDM, dementia, stage IV chronic kidney disease, acute on chronic systolic and diastolic CHF in the context of severe MR and pulmonary hypertension, not surgical candidate, recently hospitalized and discharged 02/06/17 when home hospice was recommended which family declined, presented with worsening dyspnea, PND cough without fever or chills and wheezing. Admitted for multifactorial dyspnea due to decompensated CHF, possible pneumonia and acute viral bronchitis. Cardiology and palliative care medicine consulted.   OT comments  Pt progressing towards acute OT goals. Focus of session was grooming tasks at sink and functional mobility. Pt limited by DOE. Seated rest breaks and pursed lip breathing techniques incorporated. HH aide present during session and encouraging pt to complete therapy tasks. D/c plan remains appropriate.   Follow Up Recommendations  Home health OT;Supervision/Assistance - 24 hour;Other (comment)(aide)    Equipment Recommendations  None recommended by OT    Recommendations for Other Services      Precautions / Restrictions Precautions Precautions: Fall Restrictions Weight Bearing Restrictions: No       Mobility Bed Mobility Overal bed mobility: Needs Assistance Bed Mobility: Supine to Sit     Supine to sit: Min assist     General bed mobility comments: assist to move LEs off bed and to lift trunk   Transfers Overall transfer level: Needs assistance Equipment used: Rolling walker (2 wheeled) Transfers: Sit to/from Stand Sit to Stand: Mod assist         General transfer comment: assist to power trunk up straight, come to full standing position     Balance Overall balance assessment: Needs assistance Sitting-balance support: Bilateral upper extremity supported;Feet  supported Sitting balance-Leahy Scale: Good     Standing balance support: Bilateral upper extremity supported;During functional activity Standing balance-Leahy Scale: Poor Standing balance comment: reliant on UE support, very limited by fatigue                            ADL either performed or assessed with clinical judgement   ADL Overall ADL's : Needs assistance/impaired     Grooming: Oral care;Brushing hair;Standing;Moderate assistance;Sitting Grooming Details (indicate cue type and reason): Fatigue limiting. Seated rest break incorporated between tasks. Assist to comb hair with pt in standing. Encouragement from therapist and home cg given to complete tasks.                              Functional mobility during ADLs: Minimal assistance;Rolling walker General ADL Comments: Pt completed bed mobility, walked EOB to sink and completed 2 grooming tasks as detailed above. Fatigue and DOE limiting tolerance for activity. Home aide present and encouraging pt to complete therapy tasks.      Vision       Perception     Praxis      Cognition Arousal/Alertness: Awake/alert Behavior During Therapy: Flat affect Overall Cognitive Status: History of cognitive impairments - at baseline                                          Exercises     Shoulder Instructions       General Comments home aide present during session  Pertinent Vitals/ Pain       Pain Assessment: No/denies pain  Home Living                                          Prior Functioning/Environment              Frequency  Min 2X/week        Progress Toward Goals  OT Goals(current goals can now be found in the care plan section)  Progress towards OT goals: Progressing toward goals  Acute Rehab OT Goals Patient Stated Goal: to go home  OT Goal Formulation: With patient Time For Goal Achievement: 03/25/17 Potential to Achieve Goals:  Good ADL Goals Pt Will Perform Grooming: with min guard assist;standing Pt Will Perform Upper Body Dressing: with min assist;sitting Pt Will Perform Lower Body Dressing: with mod assist;sit to/from stand Pt Will Transfer to Toilet: with min guard assist;ambulating;regular height toilet;grab bars  Plan Discharge plan remains appropriate    Co-evaluation                 AM-PAC PT "6 Clicks" Daily Activity     Outcome Measure   Help from another person eating meals?: A Little Help from another person taking care of personal grooming?: A Little Help from another person toileting, which includes using toliet, bedpan, or urinal?: A Lot Help from another person bathing (including washing, rinsing, drying)?: A Lot Help from another person to put on and taking off regular upper body clothing?: A Lot Help from another person to put on and taking off regular lower body clothing?: Total 6 Click Score: 13    End of Session Equipment Utilized During Treatment: Oxygen  OT Visit Diagnosis: Unsteadiness on feet (R26.81)   Activity Tolerance Patient limited by fatigue   Patient Left in chair;with call bell/phone within reach;with chair alarm set;with family/visitor present   Nurse Communication          Time: 1610-9604 OT Time Calculation (min): 36 min  Charges: OT General Charges $OT Visit: 1 Visit OT Treatments $Self Care/Home Management : 23-37 mins     Pilar Grammes 03/13/2017, 1:23 PM

## 2017-03-13 NOTE — Care Management Important Message (Signed)
Important Message  Patient Details  Name: Bethany Nelson MRN: [id number] Date of Birth: 11/01/1926   Medicare Important Message Given:  Yes    Halo Shevlin 03/13/2017, 1:54 PM

## 2017-03-14 LAB — BASIC METABOLIC PANEL
ANION GAP: 14 (ref 5–15)
BUN: 112 mg/dL — ABNORMAL HIGH (ref 6–20)
CO2: 26 mmol/L (ref 22–32)
Calcium: 8.6 mg/dL — ABNORMAL LOW (ref 8.9–10.3)
Chloride: 101 mmol/L (ref 101–111)
Creatinine, Ser: 2.98 mg/dL — ABNORMAL HIGH (ref 0.44–1.00)
GFR, EST AFRICAN AMERICAN: 15 mL/min — AB (ref >60)
GFR, EST NON AFRICAN AMERICAN: 13 mL/min — AB (ref >60)
Glucose, Bld: 180 mg/dL — ABNORMAL HIGH (ref 65–99)
Potassium: 3.8 mmol/L (ref 3.5–5.1)
Sodium: 141 mmol/L (ref 135–145)

## 2017-03-14 LAB — CULTURE, BLOOD (ROUTINE X 2)
CULTURE: NO GROWTH
Culture: NO GROWTH
Special Requests: ADEQUATE
Special Requests: ADEQUATE

## 2017-03-14 LAB — GLUCOSE, CAPILLARY
GLUCOSE-CAPILLARY: 224 mg/dL — AB (ref 65–99)
Glucose-Capillary: 169 mg/dL — ABNORMAL HIGH (ref 65–99)

## 2017-03-14 MED ORDER — LANTUS SOLOSTAR 100 UNIT/ML ~~LOC~~ SOPN: 21.0000 [IU] | PEN_INJECTOR | SUBCUTANEOUS | 0 refills | Status: DC

## 2017-03-14 MED ORDER — PREDNISONE 5 MG PO TABS: 5.0000 mg | ORAL_TABLET | Freq: Every day | ORAL | Status: DC

## 2017-03-14 NOTE — Discharge Summary (Signed)
Physician Discharge Summary  Bethany OhmsMargie M Nelson JXB:[id number] DOB: 09/16/1926 DOA: 03/09/2017  PCP: Jerl MinaHedrick, James, MD  Admit date: 03/09/2017 Discharge date: 03/14/2017  Admitted From: Home Disposition:  Home with home health PT RN  Recommendations for Outpatient Follow-up:  1. Follow up with PCP in 1-2 weeks 2. Please obtain BMP/CBC in one week 3. Recommend Palliative Care referral  Discharge Condition:Stable CODE STATUS:Full Diet recommendation: Heart healthy   Brief/Interim Summary: 84 year old female with PMH of her IDDM, dementia, stage IV chronic kidney disease, acute on chronic systolic and diastolic CHF in the context of severe MR and pulmonary hypertension, not surgical candidate, recently hospitalized and discharged 02/06/17 when home hospice was recommended which family declined, presented with worsening dyspnea, PND cough without fever or chills and wheezing. Admitted for multifactorial dyspnea due to decompensated CHF, possible pneumonia and acute viral bronchitis. Cardiology and palliative care medicine consulted  1. Acute on chronic combined systolic and diastolic CHF with moderate to severe pulmonary hypertension secondary to severe mitral regurgitation:TTE during recent admission: LVEF 25-30 percent, grade 3 diastolic dysfunction, moderate to severe mitral regurgitation and severe pulmonary hypertension. Not surgical candidate for MR surgery. At last admission,Home hospice was recommended but family declined. BNP 4100. Patient had been continued on Lasix 80 mg IV every 12 hours, now nearing euvolemic status with lasix transitioned to PO 03/13/17. Not on ACEI/ARB due to advanced chronic kidney disease. Dr. Waymon AmatoHongalgi had discussed with Dr. Clifton JamesMcAlhany, Cardiologywho agrees that patient is hospice appropriate. Family has earlier declined discussing case with Palliative Care this week.  2. Lobar pneumonia (LLL):Recently started on oral Augmentin by PCP. Continued empirically started IV  cefepime and vancomycin.  1. CXR from 1/2 reviewed. Interval decrease in vascular congestion 2. Completed course of abx on 03/13/17 3. Acute viral bronchitis with bronchospasm:RSV positive for coronavirus. Significant bronchospasm could be due to CHF, pneumonia or acute viral bronchitis. Bronchodilator nebulizations, IV Solu-Medrol 60 mg every 12 hourly. Continue supportive treatment. On minimal O2 support. Will begin po prednisone taper on d/c 4. Dyspnea/? Acute respiratory failure with hypoxia:Although dyspneic, no hypoxia documented since admission. Secondary to above etiology. Oxygen supplementation as needed.Seems slightly better but still at high risk for decompensation. 5. Elevated troponin:Multifactorial related to demand ischemia from respiratory distress, CHF, CKD 4.Cardiology had been following 6. IDDM:Steroids causing worsening hyperglycemia. Increased Lantus to 21 units daily while admitted. Change sliding scale to moderate sensitivity.  7. Acute on Stage IV chronic kidney disease:Baseline creatinine may be in the 1.8-1.9 range. However this may have changed during recent hospitalization when creatinine ranged between 2.6-2.7. Presented with creatinine of 2.9. Likely related to cardiorenal and diuretics. Cr had been trending upwards, now improving slightly 8. Essential hypertension: Controlled. Amlodipine was held. Blood pressure soft without BP meds 9. Dementia:Continue Aricept. Mental status may be at baseline. Stable at this time 10. Dyslipidemia:Statin continued 11. Adult failure to thrive:Multifactorial due to advanced age, dementia and multiple severe significant comorbidities.  12. Anemia:Likely due to chronic disease and chronic kidney disease. Stable at this time 13. Thrombocytopenia, chronic: Stable at this time.   Discharge Diagnoses:  Principal Problem:   Acute on chronic combined systolic and diastolic heart failure (HCC) Active Problems:   Acute respiratory  failure with hypoxemia (HCC)   Severe mitral regurgitation   Moderate to severe pulmonary hypertension (HCC)   Dementia   Diabetes mellitus type 2, insulin dependent (HCC)   CKD (chronic kidney disease) stage 4, GFR 15-29 ml/min (HCC)   HCAP (healthcare-associated pneumonia)  Discharge Instructions   Allergies as of 03/14/2017      Reactions   Zithromax [azithromycin] Other (See Comments)   Noted red enhancement of vein distal to IV insertion site after several minutes of IV drug infusion- perfect outline of vein branch- no itching or edema      Medication List    STOP taking these medications   amLODipine 5 MG tablet Commonly known as:  NORVASC   amoxicillin-clavulanate 875-125 MG tablet Commonly known as:  AUGMENTIN   isosorbide mononitrate 30 MG 24 hr tablet Commonly known as:  IMDUR   losartan 100 MG tablet Commonly known as:  COZAAR   metoprolol tartrate 25 MG tablet Commonly known as:  LOPRESSOR   Potassium Chloride ER 20 MEQ Tbcr   valsartan 320 MG tablet Commonly known as:  DIOVAN     TAKE these medications   aspirin EC 81 MG tablet Take 81 mg by mouth every other day.   atorvastatin 40 MG tablet Commonly known as:  LIPITOR Take 1 tablet (40 mg total) by mouth daily.   benzonatate 100 MG capsule Commonly known as:  TESSALON Take 100 mg by mouth 3 (three) times daily as needed for cough.   cetirizine 10 MG tablet Commonly known as:  ZYRTEC Take 10 mg by mouth daily as needed for allergies.   cromolyn 4 % ophthalmic solution Commonly known as:  OPTICROM Place 1 drop into both eyes 4 (four) times daily.   donepezil 5 MG tablet Commonly known as:  ARICEPT Take 5 mg by mouth daily.   dorzolamide-timolol 22.3-6.8 MG/ML ophthalmic solution Commonly known as:  COSOPT Place 1 drop into both eyes 2 (two) times daily.   fluticasone 50 MCG/ACT nasal spray Commonly known as:  FLONASE Place 1 spray into both nostrils daily as needed for allergies.    furosemide 80 MG tablet Commonly known as:  LASIX Take 1 tablet (80 mg total) by mouth 2 (two) times daily. What changed:  when to take this   ipratropium-albuterol 0.5-2.5 (3) MG/3ML Soln Commonly known as:  DUONEB Take 3 mLs by nebulization every 4 (four) hours as needed for wheezing.   LANTUS SOLOSTAR 100 UNIT/ML Solostar Pen Generic drug:  Insulin Glargine Inject 16 Units into the skin every morning.   latanoprost 0.005 % ophthalmic solution Commonly known as:  XALATAN Place 1 drop into both eyes at bedtime.   levothyroxine 75 MCG tablet Commonly known as:  SYNTHROID, LEVOTHROID Take 75 mcg by mouth daily before breakfast.   LEXAPRO 10 MG tablet Generic drug:  escitalopram Take 10 mg by mouth daily.   pioglitazone 15 MG tablet Commonly known as:  ACTOS Take 15 mg by mouth daily.   predniSONE 5 MG tablet Commonly known as:  DELTASONE Take 1 tablet (5 mg total) by mouth daily with breakfast.   PROVENTIL HFA 108 (90 Base) MCG/ACT inhaler Generic drug:  albuterol Inhale 1 puff into the lungs every 4 (four) hours as needed for wheezing.      Follow-up Information    [facility] Follow [facility].   Specialty:  Home Health Services Why:  Arranged for Physical therapy, RN and Child psychotherapist.  Will call you to set-up visit Contact information: [address] [place] [address] [phone]          Allergies  Allergen Reactions  . Zithromax [Azithromycin] Other (See Comments)    Noted red enhancement of vein distal to IV insertion site after several minutes of IV drug  infusion- perfect outline of vein branch- no itching or edema    Consultations:  Cardiology  Procedures/Studies: Dg Chest 2 View  Result Date: 03/10/2017 CLINICAL DATA:  Heart failure EXAM: CHEST  2 VIEW COMPARISON:  03/09/2017 FINDINGS: Cardiac shadow is enlarged but stable. Increasing vascular congestion with interstitial edema is noted consistent with congestive  failure. Aortic calcifications and mitral valve calcifications are again seen. Persistent left lower lobe infiltrate is noted. IMPRESSION: Increasing congestive failure. Electronically Signed   By: Alcide Clever M.D.   On: 03/10/2017 09:29   Dg Chest 2 View  Result Date: 03/09/2017 CLINICAL DATA:  Shortness of breath and cough for 2 days EXAM: CHEST  2 VIEW COMPARISON:  02/03/2017 FINDINGS: Normal heart size. Aortic atherosclerosis. Small left pleural effusion. Opacity within the posterior left lower lobe identified compatible is identified on the lateral radiograph. Diffuse chronic interstitial coarsening identified bilaterally. IMPRESSION: 1. Small left pleural effusion and left lower lobe airspace opacity which may represent pneumonia 2.  Aortic Atherosclerosis (ICD10-I70.0). Electronically Signed   By: Signa Kell M.D.   On: 03/09/2017 11:23   Dg Chest Port 1 View  Result Date: 03/11/2017 CLINICAL DATA:  84 year old female with acute respiratory failure. Subsequent encounter. EXAM: PORTABLE CHEST 1 VIEW COMPARISON:  03/10/2017. FINDINGS: Interval decrease in degree of pulmonary vascular congestion. Cardiomegaly.  Mitral valve calcification. Calcified tortuous aorta. Bilateral shoulder joint degenerative changes. IMPRESSION: Interval decrease in degree of pulmonary vascular congestion. Cardiomegaly. Aortic Atherosclerosis (ICD10-I70.0). Electronically Signed   By: Lacy Duverney M.D.   On: 03/11/2017 18:13    Subjective: Without complaints  Discharge Exam: Vitals:   03/14/17 0812 03/14/17 1132  BP:  (!) 104/56  Pulse: 77 71  Resp: 18 16  Temp:  97.9 F (36.6 C)  SpO2: 97% 92%   Vitals:   03/13/17 2052 03/14/17 0500 03/14/17 0812 03/14/17 1132  BP:  136/68  (!) 104/56  Pulse:  75 77 71  Resp:  20 18 16   Temp:  97.9 F (36.6 C)  97.9 F (36.6 C)  TempSrc:  Oral  Axillary  SpO2: 98% 100% 97% 92%  Weight:  82.6 kg (182 lb)    Height:        General: Pt is alert, awake, not in  acute distress Cardiovascular: RRR, S1/S2 +, no rubs, no gallops Respiratory: CTA bilaterally, no wheezing, no rhonchi Abdominal: Soft, NT, ND, bowel sounds + Extremities: no edema, no cyanosis   The results of significant diagnostics from this hospitalization (including imaging, microbiology, ancillary and laboratory) are listed below for reference.     Microbiology: Recent Results (from the past 240 hour(s))  Culture, blood (routine x 2) Call MD if unable to obtain prior to antibiotics being given     Status: None   Collection Time: 03/09/17  4:24 PM  Result Value Ref Range Status   Specimen Description BLOOD LEFT HAND  Final   Special Requests   Final    BOTTLES DRAWN AEROBIC AND ANAEROBIC Blood Culture adequate volume   Culture NO GROWTH 5 DAYS  Final   Report Status 03/14/2017 FINAL  Final  Culture, blood (routine x 2) Call MD if unable to obtain prior to antibiotics being given     Status: None   Collection Time: 03/09/17  4:24 PM  Result Value Ref Range Status   Specimen Description BLOOD RIGHT HAND  Final   Special Requests   Final    BOTTLES DRAWN AEROBIC AND ANAEROBIC Blood Culture adequate volume  Culture NO GROWTH 5 DAYS  Final   Report Status 03/14/2017 FINAL  Final  Respiratory Panel by PCR     Status: Abnormal   Collection Time: 03/09/17  4:47 PM  Result Value Ref Range Status   Adenovirus NOT DETECTED NOT DETECTED Final   Coronavirus 229E DETECTED (A) NOT DETECTED Final   Coronavirus HKU1 NOT DETECTED NOT DETECTED Final   Coronavirus NL63 NOT DETECTED NOT DETECTED Final   Coronavirus OC43 NOT DETECTED NOT DETECTED Final   Metapneumovirus NOT DETECTED NOT DETECTED Final   Rhinovirus / Enterovirus NOT DETECTED NOT DETECTED Final   Influenza A NOT DETECTED NOT DETECTED Final   Influenza B NOT DETECTED NOT DETECTED Final   Parainfluenza Virus 1 NOT DETECTED NOT DETECTED Final   Parainfluenza Virus 2 NOT DETECTED NOT DETECTED Final   Parainfluenza Virus 3 NOT  DETECTED NOT DETECTED Final   Parainfluenza Virus 4 NOT DETECTED NOT DETECTED Final   Respiratory Syncytial Virus NOT DETECTED NOT DETECTED Final   Bordetella pertussis NOT DETECTED NOT DETECTED Final   Chlamydophila pneumoniae NOT DETECTED NOT DETECTED Final   Mycoplasma pneumoniae NOT DETECTED NOT DETECTED Final  MRSA PCR Screening     Status: None   Collection Time: 03/12/17 10:22 AM  Result Value Ref Range Status   MRSA by PCR NEGATIVE NEGATIVE Final    Comment:        The GeneXpert MRSA Assay (FDA approved for NASAL specimens only), is one component of a comprehensive MRSA colonization surveillance program. It is not intended to diagnose MRSA infection nor to guide or monitor treatment for MRSA infections.      Labs: BNP (last 3 results) Recent Labs    06/26/16 0310 02/03/17 1022 03/09/17 1029  BNP 618.7* >4,500.0* 4,093.2*   Basic Metabolic Panel: Recent Labs  Lab 03/09/17 1609 03/10/17 0425 03/11/17 0728 03/12/17 0517 03/13/17 0556 03/14/17 0524  NA  --  141 139 138 140 141  K  --  3.9 4.4 3.7 3.7 3.8  CL  --  102 102 102 104 101  CO2  --  28 24 27 26 26   GLUCOSE  --  88 303* 196* 139* 180*  BUN  --  78* 90* 97* 104* 112*  CREATININE  --  3.03* 3.22* 3.02* 2.93* 2.98*  CALCIUM  --  8.4* 8.4* 8.2* 8.5* 8.6*  MG 2.6*  --   --   --   --   --    Liver Function Tests: Recent Labs  Lab 03/09/17 1029  AST 37  ALT 25  ALKPHOS 40  BILITOT 0.9  PROT 6.4*  ALBUMIN 3.1*   No results for input(s): LIPASE, AMYLASE in the last 168 hours. No results for input(s): AMMONIA in the last 168 hours. CBC: Recent Labs  Lab 03/09/17 1029 03/11/17 0728  WBC 6.0 4.4  NEUTROABS 5.0  --   HGB 10.3* 9.8*  HCT 33.5* 32.4*  MCV 100.0 101.3*  PLT 105* 102*   Cardiac Enzymes: Recent Labs  Lab 03/09/17 1029  TROPONINI 0.26*   BNP: Invalid input(s): POCBNP CBG: Recent Labs  Lab 03/13/17 1145 03/13/17 1649 03/13/17 2127 03/14/17 0748 03/14/17 1151  GLUCAP  180* 252* 243* 169* 224*   D-Dimer No results for input(s): DDIMER in the last 72 hours. Hgb A1c No results for input(s): HGBA1C in the last 72 hours. Lipid Profile No results for input(s): CHOL, HDL, LDLCALC, TRIG, CHOLHDL, LDLDIRECT in the last 72 hours. Thyroid function studies No results for input(s):  TSH, T4TOTAL, T3FREE, THYROIDAB in the last 72 hours.  Invalid input(s): FREET3 Anemia work up No results for input(s): VITAMINB12, FOLATE, FERRITIN, TIBC, IRON, RETICCTPCT in the last 72 hours. Urinalysis    Component Value Date/Time   COLORURINE YELLOW 02/03/2017 0903   APPEARANCEUR HAZY (A) 02/03/2017 0903   APPEARANCEUR Cloudy 05/21/2013 2205   LABSPEC 1.012 02/03/2017 0903   LABSPEC 1.012 05/21/2013 2205   PHURINE 5.0 02/03/2017 0903   GLUCOSEU NEGATIVE 02/03/2017 0903   GLUCOSEU 150 mg/dL 45/40/9811 9147   HGBUR NEGATIVE 02/03/2017 0903   BILIRUBINUR NEGATIVE 02/03/2017 0903   BILIRUBINUR Negative 05/21/2013 2205   KETONESUR NEGATIVE 02/03/2017 0903   PROTEINUR NEGATIVE 02/03/2017 0903   NITRITE NEGATIVE 02/03/2017 0903   LEUKOCYTESUR TRACE (A) 02/03/2017 0903   LEUKOCYTESUR Negative 05/21/2013 2205   Sepsis Labs Invalid input(s): PROCALCITONIN,  WBC,  LACTICIDVEN Microbiology Recent Results (from the past 240 hour(s))  Culture, blood (routine x 2) Call MD if unable to obtain prior to antibiotics being given     Status: None   Collection Time: 03/09/17  4:24 PM  Result Value Ref Range Status   Specimen Description BLOOD LEFT HAND  Final   Special Requests   Final    BOTTLES DRAWN AEROBIC AND ANAEROBIC Blood Culture adequate volume   Culture NO GROWTH 5 DAYS  Final   Report Status 03/14/2017 FINAL  Final  Culture, blood (routine x 2) Call MD if unable to obtain prior to antibiotics being given     Status: None   Collection Time: 03/09/17  4:24 PM  Result Value Ref Range Status   Specimen Description BLOOD RIGHT HAND  Final   Special Requests   Final    BOTTLES  DRAWN AEROBIC AND ANAEROBIC Blood Culture adequate volume   Culture NO GROWTH 5 DAYS  Final   Report Status 03/14/2017 FINAL  Final  Respiratory Panel by PCR     Status: Abnormal   Collection Time: 03/09/17  4:47 PM  Result Value Ref Range Status   Adenovirus NOT DETECTED NOT DETECTED Final   Coronavirus 229E DETECTED (A) NOT DETECTED Final   Coronavirus HKU1 NOT DETECTED NOT DETECTED Final   Coronavirus NL63 NOT DETECTED NOT DETECTED Final   Coronavirus OC43 NOT DETECTED NOT DETECTED Final   Metapneumovirus NOT DETECTED NOT DETECTED Final   Rhinovirus / Enterovirus NOT DETECTED NOT DETECTED Final   Influenza A NOT DETECTED NOT DETECTED Final   Influenza B NOT DETECTED NOT DETECTED Final   Parainfluenza Virus 1 NOT DETECTED NOT DETECTED Final   Parainfluenza Virus 2 NOT DETECTED NOT DETECTED Final   Parainfluenza Virus 3 NOT DETECTED NOT DETECTED Final   Parainfluenza Virus 4 NOT DETECTED NOT DETECTED Final   Respiratory Syncytial Virus NOT DETECTED NOT DETECTED Final   Bordetella pertussis NOT DETECTED NOT DETECTED Final   Chlamydophila pneumoniae NOT DETECTED NOT DETECTED Final   Mycoplasma pneumoniae NOT DETECTED NOT DETECTED Final  MRSA PCR Screening     Status: None   Collection Time: 03/12/17 10:22 AM  Result Value Ref Range Status   MRSA by PCR NEGATIVE NEGATIVE Final    Comment:        The GeneXpert MRSA Assay (FDA approved for NASAL specimens only), is one component of a comprehensive MRSA colonization surveillance program. It is not intended to diagnose MRSA infection nor to guide or monitor treatment for MRSA infections.      SIGNED:   Rickey Barbara, MD  [facility] 03/14/2017, 12:48 PM  If 7PM-7AM, please contact night-coverage [email or web address] Password [email or web address]

## 2017-03-14 NOTE — Progress Notes (Signed)
Breathing treatment requested from respiratory prior to discharge.

## 2017-03-14 NOTE — Progress Notes (Addendum)
Pt disoriented to situation this morning, audible wheezing present; O2 saturation verified on room air as 94%.  Note for MD to order laxative placed on stickynote/chart summary.

## 2017-03-14 NOTE — Progress Notes (Signed)
Pt d/c from telemetry, ccmd notified.

## 2017-03-14 NOTE — Progress Notes (Signed)
     Dr Gibson RampMcAlhany's note reviewed, patient thought to be euvolemic and transitioned to oral diuretics. Thought is that ongoing respiraotry issues are related to her chronic lung conditions. No further cardiology recs over the weekend, may call with questions. Follow up discussions with palliative care team since they have been consulted.     Joanie CoddingtonSigned, Eliazer Hemphill, MD  03/14/2017, 9:19 AM

## 2017-03-15 ENCOUNTER — Inpatient Hospital Stay (HOSPITAL_COMMUNITY)
Admission: EM | Admit: 2017-03-15 | Discharge: 2017-03-21 | DRG: 291 | Disposition: A | Discharge status: Skilled Nursing Facility | Payer: Medicare PPO | Attending: Internal Medicine | Admitting: Internal Medicine

## 2017-03-15 ENCOUNTER — Encounter (HOSPITAL_COMMUNITY): Payer: Self-pay

## 2017-03-15 ENCOUNTER — Emergency Department (HOSPITAL_COMMUNITY): Payer: Medicare PPO

## 2017-03-15 DIAGNOSIS — I272 Pulmonary hypertension, unspecified: Secondary | ICD-10-CM | POA: Diagnosis present

## 2017-03-15 DIAGNOSIS — Z515 Encounter for palliative care: Secondary | ICD-10-CM | POA: Diagnosis present

## 2017-03-15 DIAGNOSIS — I5043 Acute on chronic combined systolic (congestive) and diastolic (congestive) heart failure: Secondary | ICD-10-CM | POA: Diagnosis not present

## 2017-03-15 DIAGNOSIS — Z7982 Long term (current) use of aspirin: Secondary | ICD-10-CM | POA: Diagnosis not present

## 2017-03-15 DIAGNOSIS — L8990 Pressure ulcer of unspecified site, unspecified stage: Secondary | ICD-10-CM | POA: Diagnosis present

## 2017-03-15 DIAGNOSIS — E1122 Type 2 diabetes mellitus with diabetic chronic kidney disease: Secondary | ICD-10-CM | POA: Diagnosis present

## 2017-03-15 DIAGNOSIS — J9600 Acute respiratory failure, unspecified whether with hypoxia or hypercapnia: Secondary | ICD-10-CM | POA: Diagnosis present

## 2017-03-15 DIAGNOSIS — E785 Hyperlipidemia, unspecified: Secondary | ICD-10-CM | POA: Diagnosis present

## 2017-03-15 DIAGNOSIS — N184 Chronic kidney disease, stage 4 (severe): Secondary | ICD-10-CM | POA: Diagnosis present

## 2017-03-15 DIAGNOSIS — I340 Nonrheumatic mitral (valve) insufficiency: Secondary | ICD-10-CM | POA: Diagnosis present

## 2017-03-15 DIAGNOSIS — Z7189 Other specified counseling: Secondary | ICD-10-CM

## 2017-03-15 DIAGNOSIS — I5084 End stage heart failure: Secondary | ICD-10-CM | POA: Diagnosis present

## 2017-03-15 DIAGNOSIS — Z7984 Long term (current) use of oral hypoglycemic drugs: Secondary | ICD-10-CM

## 2017-03-15 DIAGNOSIS — Z66 Do not resuscitate: Secondary | ICD-10-CM | POA: Diagnosis present

## 2017-03-15 DIAGNOSIS — Z881 Allergy status to other antibiotic agents status: Secondary | ICD-10-CM | POA: Diagnosis not present

## 2017-03-15 DIAGNOSIS — Z79899 Other long term (current) drug therapy: Secondary | ICD-10-CM | POA: Diagnosis not present

## 2017-03-15 DIAGNOSIS — Z794 Long term (current) use of insulin: Secondary | ICD-10-CM | POA: Diagnosis not present

## 2017-03-15 DIAGNOSIS — R627 Adult failure to thrive: Secondary | ICD-10-CM | POA: Diagnosis present

## 2017-03-15 DIAGNOSIS — IMO0001 Reserved for inherently not codable concepts without codable children: Secondary | ICD-10-CM | POA: Diagnosis present

## 2017-03-15 DIAGNOSIS — E876 Hypokalemia: Secondary | ICD-10-CM | POA: Diagnosis not present

## 2017-03-15 DIAGNOSIS — E1165 Type 2 diabetes mellitus with hyperglycemia: Secondary | ICD-10-CM | POA: Diagnosis present

## 2017-03-15 DIAGNOSIS — J9601 Acute respiratory failure with hypoxia: Secondary | ICD-10-CM | POA: Diagnosis present

## 2017-03-15 DIAGNOSIS — I130 Hypertensive heart and chronic kidney disease with heart failure and stage 1 through stage 4 chronic kidney disease, or unspecified chronic kidney disease: Principal | ICD-10-CM | POA: Diagnosis present

## 2017-03-15 DIAGNOSIS — F0390 Unspecified dementia without behavioral disturbance: Secondary | ICD-10-CM | POA: Diagnosis present

## 2017-03-15 DIAGNOSIS — N189 Chronic kidney disease, unspecified: Secondary | ICD-10-CM | POA: Diagnosis present

## 2017-03-15 DIAGNOSIS — I5033 Acute on chronic diastolic (congestive) heart failure: Secondary | ICD-10-CM

## 2017-03-15 DIAGNOSIS — I10 Essential (primary) hypertension: Secondary | ICD-10-CM | POA: Diagnosis not present

## 2017-03-15 HISTORY — DX: Acute respiratory failure, unspecified whether with hypoxia or hypercapnia: J96.00

## 2017-03-15 LAB — CBC WITH DIFFERENTIAL/PLATELET
BASOS ABS: 0 10*3/uL (ref 0.0–0.1)
Basophils Relative: 0 %
Eosinophils Absolute: 0 10*3/uL (ref 0.0–0.7)
Eosinophils Relative: 0 %
HEMATOCRIT: 32.9 % — AB (ref 36.0–46.0)
HEMOGLOBIN: 9.9 g/dL — AB (ref 12.0–15.0)
LYMPHS PCT: 11 %
Lymphs Abs: 1.2 10*3/uL (ref 0.7–4.0)
MCH: 29.7 pg (ref 26.0–34.0)
MCHC: 30.1 g/dL (ref 30.0–36.0)
MCV: 98.8 fL (ref 78.0–100.0)
MONO ABS: 0.3 10*3/uL (ref 0.1–1.0)
Monocytes Relative: 3 %
NEUTROS ABS: 9.2 10*3/uL — AB (ref 1.7–7.7)
NEUTROS PCT: 86 %
Platelets: 142 10*3/uL — ABNORMAL LOW (ref 150–400)
RBC: 3.33 MIL/uL — AB (ref 3.87–5.11)
RDW: 14.7 % (ref 11.5–15.5)
WBC: 10.7 10*3/uL — AB (ref 4.0–10.5)

## 2017-03-15 LAB — GLUCOSE, CAPILLARY: GLUCOSE-CAPILLARY: 300 mg/dL — AB (ref 65–99)

## 2017-03-15 LAB — COMPREHENSIVE METABOLIC PANEL
ALBUMIN: 3 g/dL — AB (ref 3.5–5.0)
ALT: 18 U/L (ref 14–54)
AST: 23 U/L (ref 15–41)
Alkaline Phosphatase: 40 U/L (ref 38–126)
Anion gap: 12 (ref 5–15)
BILIRUBIN TOTAL: 0.8 mg/dL (ref 0.3–1.2)
BUN: 119 mg/dL — AB (ref 6–20)
CO2: 25 mmol/L (ref 22–32)
CREATININE: 2.99 mg/dL — AB (ref 0.44–1.00)
Calcium: 8.5 mg/dL — ABNORMAL LOW (ref 8.9–10.3)
Chloride: 99 mmol/L — ABNORMAL LOW (ref 101–111)
GFR calc Af Amer: 15 mL/min — ABNORMAL LOW (ref >60)
GFR calc non Af Amer: 13 mL/min — ABNORMAL LOW (ref >60)
GLUCOSE: 337 mg/dL — AB (ref 65–99)
POTASSIUM: 3.6 mmol/L (ref 3.5–5.1)
Sodium: 136 mmol/L (ref 135–145)
TOTAL PROTEIN: 6 g/dL — AB (ref 6.5–8.1)

## 2017-03-15 LAB — MRSA PCR SCREENING: MRSA BY PCR: NEGATIVE

## 2017-03-15 LAB — I-STAT CG4 LACTIC ACID, ED: Lactic Acid, Venous: 1.49 mmol/L (ref 0.5–1.9)

## 2017-03-15 LAB — TROPONIN I: Troponin I: 1.29 ng/mL (ref <0.03)

## 2017-03-15 LAB — BRAIN NATRIURETIC PEPTIDE: B Natriuretic Peptide: 4468.1 pg/mL — ABNORMAL HIGH (ref 0.0–100.0)

## 2017-03-15 MED ORDER — ACETAMINOPHEN 325 MG PO TABS: 650.0000 mg | ORAL_TABLET | ORAL | Status: DC | PRN

## 2017-03-15 MED ORDER — METHYLPREDNISOLONE SODIUM SUCC 40 MG IJ SOLR
40.0000 mg | Freq: Two times a day (BID) | INTRAMUSCULAR | Status: DC
  Administered 2017-03-15 – 2017-03-16 (×2): 40 mg via INTRAVENOUS
  Filled 2017-03-15 (×2): qty 1

## 2017-03-15 MED ORDER — SODIUM CHLORIDE 0.9 % IV SOLN: 250.0000 mL | INTRAVENOUS | Status: DC | PRN

## 2017-03-15 MED ORDER — LORAZEPAM 2 MG/ML IJ SOLN
0.5000 mg | Freq: Four times a day (QID) | INTRAMUSCULAR | Status: AC | PRN
  Administered 2017-03-15 – 2017-03-16 (×2): 0.5 mg via INTRAVENOUS
  Filled 2017-03-15 (×3): qty 1

## 2017-03-15 MED ORDER — INSULIN ASPART 100 UNIT/ML ~~LOC~~ SOLN
0.0000 [IU] | SUBCUTANEOUS | Status: DC
  Administered 2017-03-15: 8 [IU] via SUBCUTANEOUS
  Administered 2017-03-16: 3 [IU] via SUBCUTANEOUS
  Administered 2017-03-16 (×2): 8 [IU] via SUBCUTANEOUS
  Administered 2017-03-17 (×2): 3 [IU] via SUBCUTANEOUS
  Administered 2017-03-17: 5 [IU] via SUBCUTANEOUS

## 2017-03-15 MED ORDER — ALBUTEROL SULFATE (2.5 MG/3ML) 0.083% IN NEBU
5.0000 mg | INHALATION_SOLUTION | Freq: Once | RESPIRATORY_TRACT | Status: AC
  Administered 2017-03-15: 5 mg via RESPIRATORY_TRACT
  Filled 2017-03-15: qty 6

## 2017-03-15 MED ORDER — ONDANSETRON HCL 4 MG/2ML IJ SOLN: 4.0000 mg | Freq: Four times a day (QID) | INTRAMUSCULAR | Status: DC | PRN

## 2017-03-15 MED ORDER — FUROSEMIDE 10 MG/ML IJ SOLN
80.0000 mg | Freq: Two times a day (BID) | INTRAMUSCULAR | Status: DC
  Administered 2017-03-16 – 2017-03-21 (×11): 80 mg via INTRAVENOUS
  Filled 2017-03-15 (×11): qty 8

## 2017-03-15 MED ORDER — ENOXAPARIN SODIUM 30 MG/0.3ML ~~LOC~~ SOLN
30.0000 mg | SUBCUTANEOUS | Status: DC
  Administered 2017-03-15 – 2017-03-20 (×6): 30 mg via SUBCUTANEOUS
  Filled 2017-03-15 (×6): qty 0.3

## 2017-03-15 MED ORDER — IPRATROPIUM BROMIDE 0.02 % IN SOLN
1.0000 mg | Freq: Once | RESPIRATORY_TRACT | Status: AC
  Administered 2017-03-15: 1 mg via RESPIRATORY_TRACT
  Filled 2017-03-15: qty 5

## 2017-03-15 MED ORDER — METOPROLOL TARTRATE 5 MG/5ML IV SOLN
5.0000 mg | Freq: Three times a day (TID) | INTRAVENOUS | Status: DC
  Administered 2017-03-16 – 2017-03-21 (×12): 5 mg via INTRAVENOUS
  Filled 2017-03-15 (×15): qty 5

## 2017-03-15 MED ORDER — ALBUTEROL (5 MG/ML) CONTINUOUS INHALATION SOLN
10.0000 mg/h | INHALATION_SOLUTION | Freq: Once | RESPIRATORY_TRACT | Status: AC
  Administered 2017-03-15: 10 mg/h via RESPIRATORY_TRACT
  Filled 2017-03-15: qty 20

## 2017-03-15 MED ORDER — FUROSEMIDE 10 MG/ML IJ SOLN
80.0000 mg | Freq: Once | INTRAMUSCULAR | Status: AC
  Administered 2017-03-15: 80 mg via INTRAVENOUS
  Filled 2017-03-15: qty 8

## 2017-03-15 MED ORDER — SODIUM CHLORIDE 0.9% FLUSH
3.0000 mL | Freq: Two times a day (BID) | INTRAVENOUS | Status: DC
  Administered 2017-03-15 – 2017-03-21 (×12): 3 mL via INTRAVENOUS

## 2017-03-15 MED ORDER — MAGNESIUM SULFATE 2 GM/50ML IV SOLN
2.0000 g | Freq: Once | INTRAVENOUS | Status: AC
  Administered 2017-03-15: 2 g via INTRAVENOUS
  Filled 2017-03-15: qty 50

## 2017-03-15 MED ORDER — IPRATROPIUM-ALBUTEROL 0.5-2.5 (3) MG/3ML IN SOLN
3.0000 mL | Freq: Four times a day (QID) | RESPIRATORY_TRACT | Status: DC
  Administered 2017-03-15 – 2017-03-16 (×3): 3 mL via RESPIRATORY_TRACT
  Filled 2017-03-15 (×3): qty 3

## 2017-03-15 MED ORDER — INSULIN GLARGINE 100 UNIT/ML ~~LOC~~ SOLN
16.0000 [IU] | SUBCUTANEOUS | Status: DC
  Administered 2017-03-16 – 2017-03-17 (×2): 16 [IU] via SUBCUTANEOUS
  Filled 2017-03-15 (×2): qty 0.16

## 2017-03-15 MED ORDER — METHYLPREDNISOLONE SODIUM SUCC 125 MG IJ SOLR
125.0000 mg | Freq: Once | INTRAMUSCULAR | Status: AC
  Administered 2017-03-15: 125 mg via INTRAVENOUS
  Filled 2017-03-15: qty 2

## 2017-03-15 MED ORDER — SODIUM CHLORIDE 0.9% FLUSH: 3.0000 mL | INTRAVENOUS | Status: DC | PRN

## 2017-03-15 NOTE — H&P (Signed)
History and Physical    Bethany Nelson:[id number] DOB: 04/15/1926 DOA: 03/15/2017   PCP: Jerl Mina, MD   Attending physician: Luberta Robertson  Patient coming from/Resides with: Private residence  Chief Complaint: Shortness of breath  HPI: Bethany Nelson is a 84 y.o. female with medical history significant for diabetes on insulin, dementia, stage IV chronic kidney disease, new diagnosis November 2018 of chronic systolic and diastolic heart failure in the context of severe mitral regurgitation.  During that initial diagnosis cardiology was consulted and determined the patient was not a surgical candidate for repair of the mitral valve.  She was discharged on diuretics, metoprolol and Imdur were noting she also had associated underlying severe pulmonary hypertension.  Patient was readmitted to the hospital on 12/31 for dyspnea without hypoxemia and elevated BNP.  Chest x-ray revealed a left lower lobe pneumonia noting patient had oral Augmentin prior to admission.  Respiratory viral panel was positive for coronavirus.  She was treated with IV Solu-Medrol and oxygen noting again no definitive hypoxemia when checked.  Because of the severity of her symptoms cardiology was consulted and determined the patient was hospice appropriate given her underlying end-stage heart failure and an operable valvular heart disease.  Palliative medicine was consulted.  Patient was made DNR status but family was not ready to progress to comfort measures.  Apparently family declined home health hospice and the patient was discharged home yesterday on 1/5.    Today patient developed significant dyspnea shortness of breath with audible wheezing.  Patient tried albuterol neb at home without any relief.  She took 60 mg of prednisone that have been prescribed at discharge without symptom relief either.  EMS was called to the home and gave the patient albuterol and Atrovent.  Upon presentation to the ER she was still  experiencing slightly labored breathing but vital signs were stable, her sats were 95% on 2 L and her CBG was 396.  Chest x-ray revealed interstitial infiltrates increased since previous exam consistent with pulmonary edema.  Because of persistent dyspnea and increased work of breathing despite nasal cannula oxygen EDP opted to place the patient on BiPAP with quick decrease in work of breathing.  Was also given a dose of Lasix 80 mg IV x1.  As well as an additional nebulizer treatment.  In review of intake and output patient has not had significant increase in urinary output since arrival.  EDP confirmed DNR status and spent an extensive amount of time discussing with the patient's daughter and other family members in the room patient's poor prognosis and recommendation to pursue comfort measures.  Upon my discussion with the patient's daughter she was very conflicted about purpose and goals of palliative care and at this time is not interested in pursuing comfort care and instead would like to continue to treat patient's underlying heart failure for a few days while she and the rest of the family discuss goals of care for making a final decision.  ED Course:  Vital Signs: BP 130/66   Pulse (!) 59   Temp (!) 97.5 F (36.4 C) (Oral)   Resp 18   Wt 82.6 kg (182 lb)   SpO2 100%   BMI 30.29 kg/m  Chest x-ray: As above Lab data: Sodium 136, potassium 3.6, chloride 99, CO2 25, glucose 337, BUN 119, creatinine 2.99, albumin 3.0, LFTs not elevated, BNP 4468, troponin I 0.29, lactic acid 1.49, white count 10,700 with neutrophils 86% and absolute neutrophils 9.2% in the context of  recent steroid therapy, hemoglobin 9.9, platelets 142,000 (documented chronic thrombocytopenia) Medications and treatments: Albuterol neb 5 mg x1, continuous albuterol neb 10 mg/HR x1, Solu-Medrol 125 mg IV x1, magnesium 2 g IV x1, Atrovent 1 mg x1, Lasix 80 mg IV x1  Review of Systems:  In addition to the HPI above,  No  Fever-chills, myalgias or other constitutional symptoms No Headache, changes with Vision or hearing, new weakness, tingling, numbness in any extremity, dizziness, dysarthria or word finding difficulty, gait disturbance or imbalance, tremors or seizure activity No problems swallowing food or Liquids, indigestion/reflux, choking or coughing while eating, abdominal pain with or after eating No Chest pain, palpitations No Abdominal pain, N/V, melena,hematochezia, dark tarry stools, constipation No dysuria, malodorous urine, hematuria or flank pain No new skin rashes, lesions, masses or bruises, No new joint pains, aches, swelling or redness No recent unintentional weight gain or loss No polyuria, polydypsia or polyphagia   Past Medical History:  Diagnosis Date  . Chronic diastolic congestive heart failure (HCC)    a. 02/2016: echo showing normal EF, Grade 3 DD, mild MR, trivial TR  . CKD (chronic kidney disease), stage IV (HCC)   . Dementia   . Diabetes mellitus without complication (HCC)   . Hypertension   . Pneumonia 03/10/2017    Past Surgical History:  Procedure Laterality Date  . CATARACT EXTRACTION    . THYROID SURGERY      Social History   Socioeconomic History  . Marital status: Widowed    Spouse name: Not on file  . Number of children: Not on file  . Years of education: Not on file  . Highest education level: Not on file  Social Needs  . Financial resource strain: Not on file  . Food insecurity - worry: Not on file  . Food insecurity - inability: Not on file  . Transportation needs - medical: Not on file  . Transportation needs - non-medical: Not on file  Occupational History  . Not on file  Tobacco Use  . Smoking status: Never Smoker  . Smokeless tobacco: Never Used  Substance and Sexual Activity  . Alcohol use: No  . Drug use: No  . Sexual activity: Not on file  Other Topics Concern  . Not on file  Social History Narrative  . Not on file    Mobility:  Home health PT recommended during previous admission; of note ambulatory pulse oximetry on room air was 94-96%.  Patient was moderate assist to transition from sitting to standing from bed and was able to ambulate 10 feet but activity extremely limited by fatigue and poor functional activity tolerance Work history: Not obtained   Allergies  Allergen Reactions  . Zithromax [Azithromycin] Other (See Comments)    Noted red enhancement of vein distal to IV insertion site after several minutes of IV drug infusion- perfect outline of vein branch- no itching or edema    Family History  Problem Relation Age of Onset  . Esophageal cancer Mother   . Heart disease Neg Hx      Prior to Admission medications   Medication Sig Start Date End Date Taking? Authorizing Provider  acetaminophen (TYLENOL) 500 MG tablet Take 1,000 mg by mouth every 6 (six) hours as needed for mild pain.   Yes [provider]  albuterol (PROVENTIL HFA) 108 (90 Base) MCG/ACT inhaler Inhale 1 puff into the lungs every 4 (four) hours as needed for wheezing. 08/18/16 08/18/17 Yes [provider]  aspirin EC 81 MG  tablet Take 81 mg by mouth every other day.   Yes [provider]  atorvastatin (LIPITOR) 40 MG tablet Take 1 tablet (40 mg total) by mouth daily. 02/06/17  Yes Helberg, Jill AlexandersJustin, MD  benzonatate (TESSALON) 100 MG capsule Take 100 mg by mouth 3 (three) times daily as needed for cough. 03/07/17  Yes [provider]  Biotin 1000 MCG CHEW Chew 1,000 mcg by mouth daily.    Yes [provider]  cetirizine (ZYRTEC) 10 MG tablet Take 10 mg by mouth daily as needed for allergies.   Yes [provider]  cromolyn (OPTICROM) 4 % ophthalmic solution Place 1 drop into both eyes 4 (four) times daily.   Yes [provider]  donepezil (ARICEPT) 5 MG tablet Take 5 mg by mouth daily. 05/13/16  Yes [provider]  dorzolamide-timolol (COSOPT) 22.3-6.8 MG/ML ophthalmic  solution Place 1 drop into both eyes 2 (two) times daily. 12/16/15  Yes [provider]  escitalopram (LEXAPRO) 10 MG tablet Take 10 mg by mouth daily.    Yes [provider]  fluticasone (FLONASE) 50 MCG/ACT nasal spray Place 1 spray into both nostrils daily as needed for allergies.  02/17/16  Yes [provider]  furosemide (LASIX) 80 MG tablet Take 1 tablet (80 mg total) by mouth 2 (two) times daily. 02/06/17  Yes Helberg, Jill AlexandersJustin, MD  ipratropium-albuterol (DUONEB) 0.5-2.5 (3) MG/3ML SOLN Take 3 mLs by nebulization every 4 (four) hours as needed for wheezing. 01/31/17  Yes [provider]  LANTUS SOLOSTAR 100 UNIT/ML Solostar Pen Inject 21 Units into the skin every morning. Patient taking differently: Inject 10-16 Units into the skin See admin instructions. Inject 10-16 units subcutaneously if blood sugar is 90 or more:  CBG 90's inject 10 units, 115-125 14 units, >125 16 units 03/14/17  Yes Jerald Kiefhiu, Stephen K, MD  latanoprost (XALATAN) 0.005 % ophthalmic solution Place 1 drop into both eyes at bedtime. 02/15/16  Yes [provider]  metoprolol tartrate (LOPRESSOR) 25 MG tablet Take 12.5 mg by mouth 2 (two) times daily. 02/24/17  Yes [provider]  pioglitazone (ACTOS) 15 MG tablet Take 15 mg by mouth daily.  07/21/16  Yes [provider]  potassium chloride SA (K-DUR,KLOR-CON) 20 MEQ tablet Take 20 mEq by mouth 2 (two) times daily.   Yes [provider]  predniSONE (DELTASONE) 10 MG tablet Take 10-60 mg by mouth See admin instructions. Tapered course started 03/15/17: take 1 tablet (10 mg) by mouth 6 times on day 1-3, then take 1 tablet (10 mg) 4 times on days 4-6, then take 1 tablet (10 mg) 2 times on days 7-9, then take 1 tablet (10 mg) daily on days 10-12, then take 1/2 tablet (5 mg) on days 13-15, then stop   Yes [provider]  predniSONE (DELTASONE) 5 MG tablet Take 1 tablet (5 mg total) by mouth daily with  breakfast. Patient not taking: Reported on 03/15/2017 03/14/17   Jerald Kiefhiu, Stephen K, MD    Physical Exam: Vitals:   03/15/17 1628 03/15/17 1645 03/15/17 1715 03/15/17 1730  BP: 123/66 125/70 121/60 130/66  Pulse: (!) 54 60 (!) 55 (!) 59  Resp: 16 20 (!) 22 18  Temp:      TempSrc:      SpO2: 100% 99% 100% 100%  Weight:          Constitutional: NAD, calm and is sleeping well on BiPAP noting he has not received any sedative medications Eyes: PERRL, lids and  conjunctivae normal ENMT: Unable to adequately assess oral or pharyngeal cavity secondary to BiPAP mask in place Neck: normal, supple, no masses, no thyromegaly Respiratory: Coarse to auscultation with diffuse expiratory crackles.  Normal respiratory effort without accessory muscle use with BiPAP in place.  FiO2 30% Cardiovascular: Regular rhythm with occasional bradycardic rates 50s, no murmurs / rubs / gallops. No extremity edema. 2+ pedal pulses. No carotid bruits.  7 cm JVD bilaterally. Abdomen: no tenderness, no masses palpated. No appreciable hepatosplenomegaly. Bowel sounds positive.  Musculoskeletal: no clubbing / cyanosis. No joint deformity upper and lower extremities. Good ROM, no contractures. Normal muscle tone.  Skin: no rashes, lesions, ulcers. No induration Neurologic: CN 2-12 appears to be grossly intact stone visual inspection. Sensation intact, DTR not tested -strength not tested Psychiatric: Unable to assess noting patient sleeping deeply while on BiPAP.    Labs on Admission: I have personally reviewed following labs and imaging studies  CBC: Recent Labs  Lab 03/09/17 1029 03/11/17 0728 03/15/17 1320  WBC 6.0 4.4 10.7*  NEUTROABS 5.0  --  9.2*  HGB 10.3* 9.8* 9.9*  HCT 33.5* 32.4* 32.9*  MCV 100.0 101.3* 98.8  PLT 105* 102* 142*   Basic Metabolic Panel: Recent Labs  Lab 03/09/17 1609  03/11/17 0728 03/12/17 0517 03/13/17 0556 03/14/17 0524 03/15/17 1320  NA  --    < > 139 138 140 141 136  K  --     < > 4.4 3.7 3.7 3.8 3.6  CL  --    < > 102 102 104 101 99*  CO2  --    < > 24 27 26 26 25   GLUCOSE  --    < > 303* 196* 139* 180* 337*  BUN  --    < > 90* 97* 104* 112* 119*  CREATININE  --    < > 3.22* 3.02* 2.93* 2.98* 2.99*  CALCIUM  --    < > 8.4* 8.2* 8.5* 8.6* 8.5*  MG 2.6*  --   --   --   --   --   --    < > = values in this interval not displayed.   GFR: Estimated Creatinine Clearance: 13.8 mL/min (A) (by C-G formula based on SCr of 2.99 mg/dL (H)). Liver Function Tests: Recent Labs  Lab 03/09/17 1029 03/15/17 1320  AST 37 23  ALT 25 18  ALKPHOS 40 40  BILITOT 0.9 0.8  PROT 6.4* 6.0*  ALBUMIN 3.1* 3.0*   No results for input(s): LIPASE, AMYLASE in the last 168 hours. No results for input(s): AMMONIA in the last 168 hours. Coagulation Profile: No results for input(s): INR, PROTIME in the last 168 hours. Cardiac Enzymes: Recent Labs  Lab 03/09/17 1029 03/15/17 1320  TROPONINI 0.26* 1.29*   BNP (last 3 results) Recent Labs    07/03/16 1350  PROBNP 4,153*   HbA1C: No results for input(s): HGBA1C in the last 72 hours. CBG: Recent Labs  Lab 03/13/17 1145 03/13/17 1649 03/13/17 2127 03/14/17 0748 03/14/17 1151  GLUCAP 180* 252* 243* 169* 224*   Lipid Profile: No results for input(s): CHOL, HDL, LDLCALC, TRIG, CHOLHDL, LDLDIRECT in the last 72 hours. Thyroid Function Tests: No results for input(s): TSH, T4TOTAL, FREET4, T3FREE, THYROIDAB in the last 72 hours. Anemia Panel: No results for input(s): VITAMINB12, FOLATE, FERRITIN, TIBC, IRON, RETICCTPCT in the last 72 hours. Urine analysis:    Component Value Date/Time   COLORURINE YELLOW 02/03/2017 0903   APPEARANCEUR HAZY (A) 02/03/2017 9147  APPEARANCEUR Cloudy 05/21/2013 2205   LABSPEC 1.012 02/03/2017 0903   LABSPEC 1.012 05/21/2013 2205   PHURINE 5.0 02/03/2017 0903   GLUCOSEU NEGATIVE 02/03/2017 0903   GLUCOSEU 150 mg/dL 16/12/9602 5409   HGBUR NEGATIVE 02/03/2017 0903   BILIRUBINUR NEGATIVE  02/03/2017 0903   BILIRUBINUR Negative 05/21/2013 2205   KETONESUR NEGATIVE 02/03/2017 0903   PROTEINUR NEGATIVE 02/03/2017 0903   NITRITE NEGATIVE 02/03/2017 0903   LEUKOCYTESUR TRACE (A) 02/03/2017 0903   LEUKOCYTESUR Negative 05/21/2013 2205   Sepsis Labs: @LABRCNTIP (procalcitonin:4,lacticidven:4) ) Recent Results (from the past 240 hour(s))  Culture, blood (routine x 2) Call MD if unable to obtain prior to antibiotics being given     Status: None   Collection Time: 03/09/17  4:24 PM  Result Value Ref Range Status   Specimen Description BLOOD LEFT HAND  Final   Special Requests   Final    BOTTLES DRAWN AEROBIC AND ANAEROBIC Blood Culture adequate volume   Culture NO GROWTH 5 DAYS  Final   Report Status 03/14/2017 FINAL  Final  Culture, blood (routine x 2) Call MD if unable to obtain prior to antibiotics being given     Status: None   Collection Time: 03/09/17  4:24 PM  Result Value Ref Range Status   Specimen Description BLOOD RIGHT HAND  Final   Special Requests   Final    BOTTLES DRAWN AEROBIC AND ANAEROBIC Blood Culture adequate volume   Culture NO GROWTH 5 DAYS  Final   Report Status 03/14/2017 FINAL  Final  Respiratory Panel by PCR     Status: Abnormal   Collection Time: 03/09/17  4:47 PM  Result Value Ref Range Status   Adenovirus NOT DETECTED NOT DETECTED Final   Coronavirus 229E DETECTED (A) NOT DETECTED Final   Coronavirus HKU1 NOT DETECTED NOT DETECTED Final   Coronavirus NL63 NOT DETECTED NOT DETECTED Final   Coronavirus OC43 NOT DETECTED NOT DETECTED Final   Metapneumovirus NOT DETECTED NOT DETECTED Final   Rhinovirus / Enterovirus NOT DETECTED NOT DETECTED Final   Influenza A NOT DETECTED NOT DETECTED Final   Influenza B NOT DETECTED NOT DETECTED Final   Parainfluenza Virus 1 NOT DETECTED NOT DETECTED Final   Parainfluenza Virus 2 NOT DETECTED NOT DETECTED Final   Parainfluenza Virus 3 NOT DETECTED NOT DETECTED Final   Parainfluenza Virus 4 NOT DETECTED NOT  DETECTED Final   Respiratory Syncytial Virus NOT DETECTED NOT DETECTED Final   Bordetella pertussis NOT DETECTED NOT DETECTED Final   Chlamydophila pneumoniae NOT DETECTED NOT DETECTED Final   Mycoplasma pneumoniae NOT DETECTED NOT DETECTED Final  MRSA PCR Screening     Status: None   Collection Time: 03/12/17 10:22 AM  Result Value Ref Range Status   MRSA by PCR NEGATIVE NEGATIVE Final    Comment:        The GeneXpert MRSA Assay (FDA approved for NASAL specimens only), is one component of a comprehensive MRSA colonization surveillance program. It is not intended to diagnose MRSA infection nor to guide or monitor treatment for MRSA infections.      Radiological Exams on Admission: Dg Chest Portable 1 View  Result Date: 03/15/2017 CLINICAL DATA:  Increased shortness of breath, COPD exacerbation, history CHF, diabetes mellitus, hypertension, stage IV chronic kidney disease EXAM: PORTABLE CHEST 1 VIEW COMPARISON:  Portable exam 1346 hours compared to 03/11/2017 FINDINGS: Enlargement of cardiac silhouette with pulmonary vascular congestion. Interstitial infiltrates increased since previous exam favoring pulmonary edema. Central peribronchial thickening. Atherosclerotic calcification aorta. No  gross pleural effusion or pneumothorax. Bones demineralized. IMPRESSION: Probable CHF. Electronically Signed   By: Ulyses Southward M.D.   On: 03/15/2017 14:19    EKG: (Independently reviewed) sinus rhythm with ventricular rate 64 bpm, QTC 468 ms, slightly delayed R wave rotation, underlying left bundle branch block, nonspecific downsloping ST segments in lateral leads unchanged from previous EKG, no acute ischemic changes  Assessment/Plan Principal Problem: Respiratory failure 2/2 acute on chronic combined systolic and diastolic CHF, NYHA class 4 w/ severe pulmonary hypertension secondary to severe mitral regurg -Patient presents less than 24 hours after discharge with acute congestive heart failure  symptoms -Patient has end-stage heart failure with cardiologist recommending hospice care -Patient's daughter conflicted over transitioning to comfort measures and at this time wishes to continue to treat current heart failure symptoms -Lasix 80 mg IV every 12 hours -Recent coronavirus bronchitis so we will continue Solu-Medrol for wheezing -Continue BiPAP -Daily weights, strict I/O -SBP somewhat suboptimal in the 120s so we will hold preadmission ARB but continue low-dose Lopressor IV every 8 hours; was on nitrates prior to admission but given lower blood pressure readings will not continue at this time -Family to discuss possibly transitioning to comfort measures after several days of treatment i.e. if patient does not respond -Daughter conflicted as to whether repeat to [facility] palliative medicine stating personal biases about end-of-life care and how medications are used  Active Problems:   FTT (failure to thrive) in adult -Secondary to significant pulmonary deconditioning -Hospice recommendation previously made by patient's cardiologist    Diabetes mellitus, insulin dependent (IDDM), uncontrolled  -Glucose poorly controlled in the context of acute physiologic stress as well as concomitant use of steroids -Continue preadmission Lantus -NPO so no Actos -Continue moderate SSI    CKD (chronic kidney disease) stage IV -Progressive worsening of renal function since August noting at that time BUN was 35 and creatinine 1.92 with a GFR of 22 -Current BUN 119 with creatinine of 2.99 with GFR of 13 -Patient's altered mental status may also be related to uremia-it appears patient may be developing cardiorenal syndrome    HTN, goal below 140/90 -Current blood pressure controlled on preadmission regimen    HLD (hyperlipidemia) -NPO so hold preadmission Lipitor      DVT prophylaxis: Lovenox with dose adjusted for low GFR Code Status: DNR Family Communication: Daughter and other family  member at bedside Disposition Plan: TBD Consults called: None    ELLIS,ALLISON L. ANP-BC [facility] Pager [phone]   If 7PM-7AM, please contact night-coverage [email or web address] Password [facility]  03/15/2017, 6:07 PM

## 2017-03-15 NOTE — ED Notes (Signed)
This RN attempted IV access twice without success, Another RN to try 

## 2017-03-15 NOTE — ED Notes (Signed)
ED Provider at bedside. 

## 2017-03-15 NOTE — ED Provider Notes (Signed)
[facility] EMERGENCY DEPARTMENT Provider Note   CSN: [id number] Arrival date & time:        History   Chief Complaint Chief Complaint  Patient presents with  . Shortness of Breath  . Pneumonia    HPI Bethany Nelson is a 84 y.o. female.  HPI   84 year old female with history of chronic diastolic heart failure status post recent admission for viral URI with concomitant heart failure, discharged yesterday, here shortness of breath.  The patient reportedly has been increasing short of breath since returning home.  She awoke today with severe worsening of her shortness of breath.  She has had difficulty breathing and speaking.  She endorses associated wet cough with clear sputum.  Denies any fevers.  She is having difficulty doing anything other than sitting, so she subsequently presents for evaluation.  Per review of records, the patient was not interested in palliative consult during her last admission.  Hospice has been mentioned but she is not currently on hospice.  She denies any chest pain currently.  She endorses severe shortness of breath.  This is worse with any kind of movement as well as lying down.  Past Medical History:  Diagnosis Date  . Chronic diastolic congestive heart failure (HCC)    a. 02/2016: echo showing normal EF, Grade 3 DD, mild MR, trivial TR  . CKD (chronic kidney disease), stage IV (HCC)   . Dementia   . Diabetes mellitus without complication (HCC)   . Hypertension   . Pneumonia 03/10/2017    Patient Active Problem List   Diagnosis Date Noted  . Acute on chronic combined systolic and diastolic CHF, NYHA class 4 w/ severe pulmonary hypertension secondary to severe mitral regurg 03/15/2017  . Diabetes mellitus, insulin dependent (IDDM), uncontrolled (HCC) 03/15/2017  . Acute respiratory failure (HCC) 03/15/2017  . CKD (chronic kidney disease) stage IV 03/15/2017  . HTN, goal below 140/90 03/15/2017  . HLD (hyperlipidemia) 03/15/2017   . FTT (failure to thrive) in adult 03/15/2017  . Acute on chronic combined systolic and diastolic heart failure (HCC) 03/09/2017  . Acute respiratory failure with hypoxemia (HCC) 03/09/2017  . Severe mitral regurgitation 03/09/2017  . Moderate to severe pulmonary hypertension (HCC) 03/09/2017  . Dementia 03/09/2017  . Diabetes mellitus type 2, insulin dependent (HCC) 03/09/2017  . CKD (chronic kidney disease) stage 4, GFR 15-29 ml/min (HCC) 03/09/2017  . HCAP (healthcare-associated pneumonia) 03/09/2017  . Palliative care by specialist   . Advance care planning   . Mitral valve disorder   . Decreased urine output   . Dyspnea 02/03/2017  . Arthritis 07/16/2016  . Depression 07/16/2016  . Thyroid disease 07/16/2016  . Elevated troponin 06/22/2016  . Acute respiratory failure with hypoxia (HCC) 06/22/2016  . 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 Chronic diastolic heart failure (HCC) 05/12/2016  . Hyperlipidemia, unspecified 05/12/2016  . Heart failure with reduced ejection fraction (HCC) 02/21/2016  . Hypertension 02/21/2016  . Type 2 diabetes mellitus with stage 3 chronic kidney disease, with long-term current use of insulin (HCC) 02/21/2016  . CKD (chronic kidney disease), stage IV (HCC) 02/21/2016  . New onset of congestive heart failure (HCC) 02/21/2016    Past Surgical History:  Procedure Laterality Date  . CATARACT EXTRACTION    . THYROID SURGERY      OB History    No data available  Home Medications    Prior to Admission medications   Medication Sig Start Date End Date Taking? Authorizing Provider  acetaminophen (TYLENOL) 500 MG tablet Take 1,000 mg by mouth every 6 (six) hours as needed for mild pain.   Yes [provider]  albuterol (PROVENTIL HFA) 108 (90 Base) MCG/ACT inhaler Inhale 1 puff into the lungs every 4 (four) hours  as needed for wheezing. 08/18/16 08/18/17 Yes [provider]  aspirin EC 81 MG tablet Take 81 mg by mouth every other day.   Yes [provider]  atorvastatin (LIPITOR) 40 MG tablet Take 1 tablet (40 mg total) by mouth daily. 02/06/17  Yes Helberg, Jill Alexanders, MD  benzonatate (TESSALON) 100 MG capsule Take 100 mg by mouth 3 (three) times daily as needed for cough. 03/07/17  Yes [provider]  Biotin 1000 MCG CHEW Chew 1,000 mcg by mouth every other day.   Yes [provider]  cetirizine (ZYRTEC) 10 MG tablet Take 10 mg by mouth daily as needed for allergies.   Yes [provider]  cromolyn (OPTICROM) 4 % ophthalmic solution Place 1 drop into both eyes 4 (four) times daily.   Yes [provider]  donepezil (ARICEPT) 5 MG tablet Take 5 mg by mouth daily. 05/13/16  Yes [provider]  dorzolamide-timolol (COSOPT) 22.3-6.8 MG/ML ophthalmic solution Place 1 drop into both eyes 2 (two) times daily. 12/16/15  Yes [provider]  escitalopram (LEXAPRO) 10 MG tablet Take 10 mg by mouth daily.    Yes [provider]  fluticasone (FLONASE) 50 MCG/ACT nasal spray Place 1 spray into both nostrils daily as needed for allergies.  02/17/16  Yes [provider]  furosemide (LASIX) 80 MG tablet Take 1 tablet (80 mg total) by mouth 2 (two) times daily. Patient taking differently: Take 80 mg by mouth daily.  02/06/17  Yes Helberg, Jill Alexanders, MD  glucose-Vitamin C 4-0.006 GM CHEW chewable tablet Chew 1 tablet by mouth every other day.   Yes [provider]  ipratropium-albuterol (DUONEB) 0.5-2.5 (3) MG/3ML SOLN Take 3 mLs by nebulization every 4 (four) hours as needed for wheezing. 01/31/17  Yes [provider]  isosorbide mononitrate (IMDUR) 30 MG 24 hr tablet Take 30 mg by mouth daily. 02/06/17  Yes [provider]  LANTUS SOLOSTAR 100 UNIT/ML Solostar Pen Inject 21 Units into the skin every morning. Patient  taking differently: Inject 16 Units into the skin every morning.  03/14/17  Yes Jerald Kief, MD  latanoprost (XALATAN) 0.005 % ophthalmic solution Place 1 drop into both eyes at bedtime. 02/15/16  Yes [provider]  losartan (COZAAR) 100 MG tablet Take 100 mg by mouth daily. 01/14/17  Yes [provider]  metoprolol tartrate (LOPRESSOR) 25 MG tablet Take 12.5 mg by mouth 2 (two) times daily. 02/24/17  Yes [provider]  pioglitazone (ACTOS) 15 MG tablet Take 15 mg by mouth daily.  07/21/16  Yes [provider]  predniSONE (DELTASONE) 5 MG tablet Take 1 tablet (5 mg total) by mouth daily with breakfast. 03/14/17  Yes Jerald Kief, MD    Family History Family History  Problem Relation Age of Onset  . Esophageal cancer Mother   . Heart disease Neg Hx     Social History Social History   Tobacco Use  . Smoking status: Never Smoker  . Smokeless tobacco: Never Used  Substance Use Topics  . Alcohol use: No  . Drug use: No  Allergies   Zithromax [azithromycin]   Review of Systems Review of Systems  Constitutional: Positive for fatigue. Negative for chills and fever.  HENT: Negative for congestion, rhinorrhea and sore throat.   Eyes: Negative for visual disturbance.  Respiratory: Positive for cough and shortness of breath. Negative for wheezing.   Cardiovascular: Negative for chest pain and leg swelling.  Gastrointestinal: Negative for abdominal pain, diarrhea, nausea and vomiting.  Genitourinary: Negative for dysuria, flank pain, vaginal bleeding and vaginal discharge.  Musculoskeletal: Negative for neck pain.  Skin: Negative for rash.  Allergic/Immunologic: Negative for immunocompromised state.  Neurological: Positive for weakness. Negative for syncope and headaches.  Hematological: Does not bruise/bleed easily.  All other systems reviewed and are negative.    Physical Exam Updated Vital Signs BP 125/70   Pulse 60   Temp (!) 97.5  F (36.4 C) (Oral)   Resp 20   Wt 82.6 kg (182 lb)   SpO2 99%   BMI 30.29 kg/m   Physical Exam  Constitutional: She is oriented to person, place, and time. She appears well-developed and well-nourished. She appears ill. She appears distressed.  HENT:  Head: Normocephalic and atraumatic.  Eyes: Conjunctivae are normal.  Neck: Neck supple.  Cardiovascular: Normal rate, regular rhythm and normal heart sounds. Exam reveals no friction rub.  No murmur heard. Pulmonary/Chest: Accessory muscle usage present. Tachypnea noted. No respiratory distress. She has decreased breath sounds. She has no wheezes. She has rales in the right middle field, the right lower field, the left middle field and the left lower field.  Abdominal: She exhibits no distension.  Musculoskeletal:       Right lower leg: She exhibits edema.       Left lower leg: She exhibits edema.  Neurological: She is alert and oriented to person, place, and time. She exhibits normal muscle tone.  Skin: Skin is warm. Capillary refill takes less than 2 seconds.  Psychiatric: She has a normal mood and affect.  Nursing note and vitals reviewed.    ED Treatments / Results  Labs (all labs ordered are listed, but only abnormal results are displayed) Labs Reviewed  CBC WITH DIFFERENTIAL/PLATELET - Abnormal; Notable for the following components:      Result Value   WBC 10.7 (*)    RBC 3.33 (*)    Hemoglobin 9.9 (*)    HCT 32.9 (*)    Platelets 142 (*)    Neutro Abs 9.2 (*)    All other components within normal limits  COMPREHENSIVE METABOLIC PANEL - Abnormal; Notable for the following components:   Chloride 99 (*)    Glucose, Bld 337 (*)    BUN 119 (*)    Creatinine, Ser 2.99 (*)    Calcium 8.5 (*)    Total Protein 6.0 (*)    Albumin 3.0 (*)    GFR calc non Af Amer 13 (*)    GFR calc Af Amer 15 (*)    All other components within normal limits  BRAIN NATRIURETIC PEPTIDE - Abnormal; Notable for the following components:   B  Natriuretic Peptide 4,468.1 (*)    All other components within normal limits  TROPONIN I - Abnormal; Notable for the following components:   Troponin I 1.29 (*)    All other components within normal limits  BASIC METABOLIC PANEL  I-STAT CG4 LACTIC ACID, ED    EKG  EKG Interpretation  Date/Time:  Sunday March 15 2017 13:03:30 EST Ventricular Rate:  64 PR Interval:  QRS Duration: 127 QT Interval:  453 QTC Calculation: 468 R Axis:   -21 Text Interpretation:  Sinus rhythm Borderline prolonged PR interval Left bundle branch block No significant change since last tracing Confirmed by Shaune Pollack [id number]) on 03/15/2017 3:28:06 PM       Radiology Dg Chest Portable 1 View  Result Date: 03/15/2017 CLINICAL DATA:  Increased shortness of breath, COPD exacerbation, history CHF, diabetes mellitus, hypertension, stage IV chronic kidney disease EXAM: PORTABLE CHEST 1 VIEW COMPARISON:  Portable exam 1346 hours compared to 03/11/2017 FINDINGS: Enlargement of cardiac silhouette with pulmonary vascular congestion. Interstitial infiltrates increased since previous exam favoring pulmonary edema. Central peribronchial thickening. Atherosclerotic calcification aorta. No gross pleural effusion or pneumothorax. Bones demineralized. IMPRESSION: Probable CHF. Electronically Signed   By: Ulyses Southward M.D.   On: 03/15/2017 14:19    Procedures .Critical Care Performed by: Shaune Pollack, MD Authorized by: Shaune Pollack, MD   Critical care provider statement:    Critical care time (minutes):  35   Critical care time was exclusive of:  Separately billable procedures and treating other patients and teaching time   Critical care was necessary to treat or prevent imminent or life-threatening deterioration of the following conditions:  Respiratory failure, circulatory failure and cardiac failure   Critical care was time spent personally by me on the following activities:  Development of treatment plan with  patient or surrogate, discussions with consultants, evaluation of patient's response to treatment, examination of patient, obtaining history from patient or surrogate, ordering and performing treatments and interventions, ordering and review of laboratory studies, ordering and review of radiographic studies, pulse oximetry, re-evaluation of patient's condition and review of old charts   I assumed direction of critical care for this patient from another provider in my specialty: no     (including critical care time)  Medications Ordered in ED Medications  furosemide (LASIX) injection 80 mg (not administered)  methylPREDNISolone sodium succinate (SOLU-MEDROL) 40 mg/mL injection 40 mg (not administered)  insulin aspart (novoLOG) injection 0-15 Units (not administered)  Insulin Glargine (LANTUS) Solostar Pen 16 Units (not administered)  ipratropium-albuterol (DUONEB) 0.5-2.5 (3) MG/3ML nebulizer solution 3 mL (not administered)  metoprolol tartrate (LOPRESSOR) injection 5 mg (5 mg Intravenous Not Given 03/15/17 1702)  sodium chloride flush (NS) 0.9 % injection 3 mL (not administered)  sodium chloride flush (NS) 0.9 % injection 3 mL (not administered)  0.9 %  sodium chloride infusion (not administered)  acetaminophen (TYLENOL) tablet 650 mg (not administered)  ondansetron (ZOFRAN) injection 4 mg (not administered)  enoxaparin (LOVENOX) injection 40 mg (not administered)  albuterol (PROVENTIL) (2.5 MG/3ML) 0.083% nebulizer solution 5 mg (5 mg Nebulization Given 03/15/17 1310)  methylPREDNISolone sodium succinate (SOLU-MEDROL) 125 mg/2 mL injection 125 mg (125 mg Intravenous Given 03/15/17 1349)  magnesium sulfate IVPB 2 g 50 mL (0 g Intravenous Stopped 03/15/17 1518)  albuterol (PROVENTIL,VENTOLIN) solution continuous neb (10 mg/hr Nebulization Given 03/15/17 1348)  ipratropium (ATROVENT) nebulizer solution 1 mg (1 mg Nebulization Given 03/15/17 1348)  furosemide (LASIX) injection 80 mg (80 mg Intravenous  Given 03/15/17 1551)     Initial Impression / Assessment and Plan / ED Course  I have reviewed the triage vital signs and the nursing notes.  Pertinent labs & imaging results that were available during my care of the patient were reviewed by me and considered in my medical decision making (see chart for details).     84 year old female with history of diastolic heart failure and recent coronavirus here with acute  worsening of her shortness of breath.  Patient was just discharged yesterday.  She is markedly tachypneic with accessory muscle usage and bilateral wheezes and rales on exam.  Unclear if this is ongoing reactive airway versus CHF.  Patient given nebulizers, steroids, and placed on BiPAP with significant improvement.  Chest x-ray shows worsening of pulmonary edema.  Patient will be given Lasix and admitted to the stepdown.  Final Clinical Impressions(s) / ED Diagnoses   Final diagnoses:  Acute on chronic diastolic heart failure (HCC)  Acute respiratory failure with hypoxia [facility](HCC)    ED Discharge Orders    None       Shaune PollackIsaacs, Lucinda Spells, MD 03/15/17 1710

## 2017-03-15 NOTE — ED Notes (Signed)
Pt placed on BiPAP

## 2017-03-15 NOTE — ED Triage Notes (Signed)
Pt from home with ems for increased SOB, COPD exacerbation. Pt d.c from hospital yesterday after being diagnosed with pneumonia. Around 8 am pt started to feel more sob with audible wheezing. Pt tried albuterol at home with no relief and pt took 60mg  of prednisone. Ems gave albuterol and atrovent en route. Pt a/o upon arrival, slightly labored breathing. BP 138/70 HR 62 95% on 2L [place]. CBG 396

## 2017-03-15 NOTE — Progress Notes (Signed)
Patient transported on BiPAP to 6E12. Vitals stable.

## 2017-03-16 ENCOUNTER — Other Ambulatory Visit: Payer: Self-pay

## 2017-03-16 ENCOUNTER — Encounter (HOSPITAL_COMMUNITY): Payer: Self-pay | Admitting: General Practice

## 2017-03-16 DIAGNOSIS — J9600 Acute respiratory failure, unspecified whether with hypoxia or hypercapnia: Secondary | ICD-10-CM

## 2017-03-16 DIAGNOSIS — L8990 Pressure ulcer of unspecified site, unspecified stage: Secondary | ICD-10-CM

## 2017-03-16 HISTORY — DX: Acute respiratory failure, unspecified whether with hypoxia or hypercapnia: J96.00

## 2017-03-16 LAB — BASIC METABOLIC PANEL
ANION GAP: 10 (ref 5–15)
BUN: 120 mg/dL — ABNORMAL HIGH (ref 6–20)
CO2: 28 mmol/L (ref 22–32)
CREATININE: 2.82 mg/dL — AB (ref 0.44–1.00)
Calcium: 8.7 mg/dL — ABNORMAL LOW (ref 8.9–10.3)
Chloride: 105 mmol/L (ref 101–111)
GFR, EST AFRICAN AMERICAN: 16 mL/min — AB (ref >60)
GFR, EST NON AFRICAN AMERICAN: 14 mL/min — AB (ref >60)
GLUCOSE: 182 mg/dL — AB (ref 65–99)
POTASSIUM: 3.5 mmol/L (ref 3.5–5.1)
Sodium: 143 mmol/L (ref 135–145)

## 2017-03-16 LAB — GLUCOSE, CAPILLARY
GLUCOSE-CAPILLARY: 115 mg/dL — AB (ref 65–99)
GLUCOSE-CAPILLARY: 160 mg/dL — AB (ref 65–99)
GLUCOSE-CAPILLARY: 257 mg/dL — AB (ref 65–99)
GLUCOSE-CAPILLARY: 262 mg/dL — AB (ref 65–99)
Glucose-Capillary: 106 mg/dL — ABNORMAL HIGH (ref 65–99)

## 2017-03-16 MED ORDER — METHYLPREDNISOLONE SODIUM SUCC 40 MG IJ SOLR
40.0000 mg | Freq: Three times a day (TID) | INTRAMUSCULAR | Status: DC
  Administered 2017-03-16 – 2017-03-18 (×6): 40 mg via INTRAVENOUS
  Filled 2017-03-16 (×6): qty 1

## 2017-03-16 MED ORDER — IPRATROPIUM-ALBUTEROL 0.5-2.5 (3) MG/3ML IN SOLN
3.0000 mL | Freq: Two times a day (BID) | RESPIRATORY_TRACT | Status: DC
Start: 2017-03-16 — End: 2017-03-21
  Administered 2017-03-16 – 2017-03-21 (×10): 3 mL via RESPIRATORY_TRACT
  Filled 2017-03-16 (×10): qty 3

## 2017-03-16 MED ORDER — METHYLPREDNISOLONE SODIUM SUCC 40 MG IJ SOLR: 40.0000 mg | Freq: Four times a day (QID) | INTRAMUSCULAR | Status: DC

## 2017-03-16 MED ORDER — ALBUTEROL SULFATE (2.5 MG/3ML) 0.083% IN NEBU: 2.5000 mg | INHALATION_SOLUTION | RESPIRATORY_TRACT | Status: DC | PRN

## 2017-03-16 NOTE — Progress Notes (Signed)
Patient ID: Bethany Nelson, female   DOB: 06/08/1926, 84 y.o.   MRN: [id number]   Thank you for consulting the Palliative Medicine Team at [facility] to meet your patient's and family's needs.   The reason that you asked us to see your patient is to establish GOC  Daughter was very reluctant to meet with Palliative Medicine but after introduction and emotional support she agrees to talk with me again in the morining.  This NP will f/u in the morning  Lorinda CreedMary Trishelle Devora NP  Palliative Medicine Team Team Phone # [phone] Pager [phone]     No charge

## 2017-03-16 NOTE — Plan of Care (Signed)
  Safety: Ability to remain free from injury will improve. Bed alarm on, call light in reach, hourly rounding performed. No falls or other injuries this shift. Will continue to monitor this shift.  03/16/2017 0541 - Progressing by Mellody DrownKuzmick, Biagio Snelson, RN   Skin Integrity: Risk for impaired skin integrity will decrease. Pt repositioned q2h. Full linen change, sheets free of wrinkles, bony prominences supported with pillows and new foam pad applied to coccyx. No new signs of skin break down this shift.  03/16/2017 0541 - Progressing by Mellody DrownKuzmick, Winferd Wease, RN

## 2017-03-16 NOTE — Progress Notes (Signed)
Pt has been off bipap. O2 99% on 2L via [place]. Pt had lunch without any difficulty swallowing.  Hinton DyerYoko Caprice Mccaffrey,

## 2017-03-16 NOTE — Progress Notes (Signed)
[facility]                                                                              Patient Demographics  Bethany Nelson, is a 84 y.o. female, DOB - 06-30-1926, ZOX:[id number]  Admit date - 03/15/2017   Admitting Physician Pieter Partridge, MD  Outpatient Primary MD for the patient is Jerl Mina, MD  Outpatient specialists:   LOS - 1  days   Medical records reviewed and are as summarized below:    Chief Complaint  Patient presents with  . Shortness of Breath  . Pneumonia       Brief summary   Bethany Nelson is a 84 y.o. female with medical history significant for diabetes on insulin, dementia, stage IV chronic kidney disease, new diagnosis November 2018 of chronic systolic and diastolic heart failure in the context of severe mitral regurgitation.  During that initial diagnosis cardiology was consulted and determined the patient was not a surgical candidate for repair of the mitral valve.  Patient was readmitted on 12/31 for pneumonia, respiratory failure, cardiology was consulted and determined patient was hospice appropriate  given her underlying end-stage CHF and inoperable severe MR.  Family at that time declined home health hospice, patient was just discharged on 1/5.  Patient return back to ED with significant dyspnea, audible wheezing, no significant improvement with albuterol nebs and prednisone at home.  Chest x-ray showed interstitial infiltrates increased consistent with pulmonary edema.  Patient was placed on BiPAP.    Assessment & Plan    Principal Problem:   Acute on chronic combined systolic and diastolic CHF, NYHA class 4 w/ severe pulmonary hypertension secondary to severe mitral regurgitation - 2D echo 01/2017 showed EF of 25-30% with diffuse hypokinesis, grade 3 diastolic dysfunction -Underlying end-stage CHF with cardiology recommending hospice care during the previous admission.  Daughter conflicted over transitioning to  hospice -Continue Lasix IV for diuresis, continue BiPAP as tolerated -Palliative medicine consulted for goals of care  Active Problems:    Acute respiratory failure (HCC) with hypoxia, audible wheezing - Continue duo nebs q6hrs, IV Solu-Medrol, BiPAP - Palliative medicine consulted for goals of care, DNR   diabetes mellitus, insulin dependent (IDDM), uncontrolled (HCC) -Continue Lantus, moderate sliding scale insulin, hold Actos -CBGs expected to rise with IV Solu-Medrol    CKD (chronic kidney disease) stage IV -Presented with creatinine of 2.99, was 3.2 on 1/2. -Progressive worsening of renal function for last 6 months, creatinine improving from yesterday.     HTN, goal below 140/90 -BP currently controlled    HLD (hyperlipidemia) -Currently n.p.o. hence Lipitor held    FTT (failure to thrive) in adult -Palliative goals of care pending    Pressure injury of skin - pressure injury documented by nursing as echymosis on arms  Code Status: DNR DVT Prophylaxis: Lovenox Family Communication: Discussed in detail with the patient, all imaging results, lab results explained to the caregiver at the bedside   Disposition Plan: Awaiting palliative consult  Time Spent in minutes 35 minutes  Procedures:  BiPAP  Consultants:   Palliative medicine  Antimicrobials:      Medications  Scheduled Meds: . enoxaparin (LOVENOX) injection  30 mg Subcutaneous Q24H  . furosemide  80 mg Intravenous Q12H  . insulin aspart  0-15 Units Subcutaneous Q4H  . insulin glargine  16 Units Subcutaneous BH-q7a  . ipratropium-albuterol  3 mL Nebulization Q6H  . methylPREDNISolone (SOLU-MEDROL) injection  40 mg Intravenous Q8H  . metoprolol tartrate  5 mg Intravenous Q8H  . sodium chloride flush  3 mL Intravenous Q12H   Continuous Infusions: . sodium chloride     PRN Meds:.sodium chloride, acetaminophen, LORazepam, ondansetron (ZOFRAN) IV, sodium chloride flush   Antibiotics    Anti-infectives (From admission, onward)   None        Subjective:   Bethany Nelson was seen and examined today.  BiPAP just taken off by RT at the time of my encounter.  Difficult to obtain review of system from the patient, caregiver at the bedside.  Afebrile.  Denies any chest pain, abdominal pain.  No nausea vomiting.  Objective:   Vitals:   03/16/17 0458 03/16/17 0500 03/16/17 0820 03/16/17 0848  BP: 131/64   115/68  Pulse: 66 65  64  Resp: 12 16  20   Temp: 97.6 F (36.4 C)   97.7 F (36.5 C)  TempSrc: Axillary   Oral  SpO2: 100% 100% 100% 100%  Weight:        Intake/Output Summary (Last 24 hours) at 03/16/2017 1047 Last data filed at 03/16/2017 0800 Gross per 24 hour  Intake 50 ml  Output 600 ml  Net -550 ml     Wt Readings from Last 3 Encounters:  03/15/17 82.6 kg (182 lb)  03/14/17 82.6 kg (182 lb)  02/06/17 83.9 kg (184 lb 15.5 oz)     Exam  General: Alert and awake, placed on NRB at the time of my encounter, ill-appearing  Eyes  HEENT:  Atraumatic, normocephalic  Cardiovascular: S1 S2 clear, Regular rate and rhythm.  Respiratory: Diffuse audible expiratory wheezing  Gastrointestinal: Soft, nontender, nondistended, + bowel sounds  Ext:  1+ pedal edema bilaterally  Neuro: no new focal deficits  Musculoskeletal: No digital cyanosis, clubbing  Skin: No rashes  Psych: difficult to assess, patient on NRB    Data Reviewed:  I have personally reviewed following labs and imaging studies  Micro Results Recent Results (from the past 240 hour(s))  Culture, blood (routine x 2) Call MD if unable to obtain prior to antibiotics being given     Status: None   Collection Time: 03/09/17  4:24 PM  Result Value Ref Range Status   Specimen Description BLOOD LEFT HAND  Final   Special Requests   Final    BOTTLES DRAWN AEROBIC AND ANAEROBIC Blood Culture adequate volume   Culture NO GROWTH 5 DAYS  Final   Report Status 03/14/2017 FINAL  Final  Culture,  blood (routine x 2) Call MD if unable to obtain prior to antibiotics being given     Status: None   Collection Time: 03/09/17  4:24 PM  Result Value Ref Range Status   Specimen Description BLOOD RIGHT HAND  Final   Special Requests   Final    BOTTLES DRAWN AEROBIC AND ANAEROBIC Blood Culture adequate volume   Culture NO GROWTH 5 DAYS  Final   Report Status 03/14/2017 FINAL  Final  Respiratory Panel by PCR     Status: Abnormal   Collection Time: 03/09/17  4:47 PM  Result Value Ref Range Status   Adenovirus NOT DETECTED NOT DETECTED Final   Coronavirus 229E  DETECTED (A) NOT DETECTED Final   Coronavirus HKU1 NOT DETECTED NOT DETECTED Final   Coronavirus NL63 NOT DETECTED NOT DETECTED Final   Coronavirus OC43 NOT DETECTED NOT DETECTED Final   Metapneumovirus NOT DETECTED NOT DETECTED Final   Rhinovirus / Enterovirus NOT DETECTED NOT DETECTED Final   Influenza A NOT DETECTED NOT DETECTED Final   Influenza B NOT DETECTED NOT DETECTED Final   Parainfluenza Virus 1 NOT DETECTED NOT DETECTED Final   Parainfluenza Virus 2 NOT DETECTED NOT DETECTED Final   Parainfluenza Virus 3 NOT DETECTED NOT DETECTED Final   Parainfluenza Virus 4 NOT DETECTED NOT DETECTED Final   Respiratory Syncytial Virus NOT DETECTED NOT DETECTED Final   Bordetella pertussis NOT DETECTED NOT DETECTED Final   Chlamydophila pneumoniae NOT DETECTED NOT DETECTED Final   Mycoplasma pneumoniae NOT DETECTED NOT DETECTED Final  MRSA PCR Screening     Status: None   Collection Time: 03/12/17 10:22 AM  Result Value Ref Range Status   MRSA by PCR NEGATIVE NEGATIVE Final    Comment:        The GeneXpert MRSA Assay (FDA approved for NASAL specimens only), is one component of a comprehensive MRSA colonization surveillance program. It is not intended to diagnose MRSA infection nor to guide or monitor treatment for MRSA infections.   MRSA PCR Screening     Status: None   Collection Time: 03/15/17  6:48 PM  Result Value Ref  Range Status   MRSA by PCR NEGATIVE NEGATIVE Final    Comment:        The GeneXpert MRSA Assay (FDA approved for NASAL specimens only), is one component of a comprehensive MRSA colonization surveillance program. It is not intended to diagnose MRSA infection nor to guide or monitor treatment for MRSA infections.     Radiology Reports Dg Chest 2 View  Result Date: 03/10/2017 CLINICAL DATA:  Heart failure EXAM: CHEST  2 VIEW COMPARISON:  03/09/2017 FINDINGS: Cardiac shadow is enlarged but stable. Increasing vascular congestion with interstitial edema is noted consistent with congestive failure. Aortic calcifications and mitral valve calcifications are again seen. Persistent left lower lobe infiltrate is noted. IMPRESSION: Increasing congestive failure. Electronically Signed   By: Alcide Clever M.D.   On: 03/10/2017 09:29   Dg Chest 2 View  Result Date: 03/09/2017 CLINICAL DATA:  Shortness of breath and cough for 2 days EXAM: CHEST  2 VIEW COMPARISON:  02/03/2017 FINDINGS: Normal heart size. Aortic atherosclerosis. Small left pleural effusion. Opacity within the posterior left lower lobe identified compatible is identified on the lateral radiograph. Diffuse chronic interstitial coarsening identified bilaterally. IMPRESSION: 1. Small left pleural effusion and left lower lobe airspace opacity which may represent pneumonia 2.  Aortic Atherosclerosis (ICD10-I70.0). Electronically Signed   By: Signa Kell M.D.   On: 03/09/2017 11:23   Dg Chest Portable 1 View  Result Date: 03/15/2017 CLINICAL DATA:  Increased shortness of breath, COPD exacerbation, history CHF, diabetes mellitus, hypertension, stage IV chronic kidney disease EXAM: PORTABLE CHEST 1 VIEW COMPARISON:  Portable exam 1346 hours compared to 03/11/2017 FINDINGS: Enlargement of cardiac silhouette with pulmonary vascular congestion. Interstitial infiltrates increased since previous exam favoring pulmonary edema. Central peribronchial  thickening. Atherosclerotic calcification aorta. No gross pleural effusion or pneumothorax. Bones demineralized. IMPRESSION: Probable CHF. Electronically Signed   By: Ulyses Southward M.D.   On: 03/15/2017 14:19   Dg Chest Port 1 View  Result Date: 03/11/2017 CLINICAL DATA:  84 year old female with acute respiratory failure. Subsequent encounter. EXAM: PORTABLE CHEST  1 VIEW COMPARISON:  03/10/2017. FINDINGS: Interval decrease in degree of pulmonary vascular congestion. Cardiomegaly.  Mitral valve calcification. Calcified tortuous aorta. Bilateral shoulder joint degenerative changes. IMPRESSION: Interval decrease in degree of pulmonary vascular congestion. Cardiomegaly. Aortic Atherosclerosis (ICD10-I70.0). Electronically Signed   By: Lacy Duverney M.D.   On: 03/11/2017 18:13    Lab Data:  CBC: Recent Labs  Lab 03/11/17 0728 03/15/17 1320  WBC 4.4 10.7*  NEUTROABS  --  9.2*  HGB 9.8* 9.9*  HCT 32.4* 32.9*  MCV 101.3* 98.8  PLT 102* 142*   Basic Metabolic Panel: Recent Labs  Lab 03/09/17 1609  03/12/17 0517 03/13/17 0556 03/14/17 0524 03/15/17 1320 03/16/17 0425  NA  --    < > 138 140 141 136 143  K  --    < > 3.7 3.7 3.8 3.6 3.5  CL  --    < > 102 104 101 99* 105  CO2  --    < > 27 26 26 25 28   GLUCOSE  --    < > 196* 139* 180* 337* 182*  BUN  --    < > 97* 104* 112* 119* 120*  CREATININE  --    < > 3.02* 2.93* 2.98* 2.99* 2.82*  CALCIUM  --    < > 8.2* 8.5* 8.6* 8.5* 8.7*  MG 2.6*  --   --   --   --   --   --    < > = values in this interval not displayed.   GFR: Estimated Creatinine Clearance: 14.6 mL/min (A) (by C-G formula based on SCr of 2.82 mg/dL (H)). Liver Function Tests: Recent Labs  Lab 03/15/17 1320  AST 23  ALT 18  ALKPHOS 40  BILITOT 0.8  PROT 6.0*  ALBUMIN 3.0*   No results for input(s): LIPASE, AMYLASE in the last 168 hours. No results for input(s): AMMONIA in the last 168 hours. Coagulation Profile: No results for input(s): INR, PROTIME in the last  168 hours. Cardiac Enzymes: Recent Labs  Lab 03/15/17 1320  TROPONINI 1.29*   BNP (last 3 results) Recent Labs    07/03/16 1350  PROBNP 4,153*   HbA1C: No results for input(s): HGBA1C in the last 72 hours. CBG: Recent Labs  Lab 03/14/17 1151 03/15/17 2057 03/16/17 0020 03/16/17 0456 03/16/17 0745  GLUCAP 224* 300* 257* 160* 115*   Lipid Profile: No results for input(s): CHOL, HDL, LDLCALC, TRIG, CHOLHDL, LDLDIRECT in the last 72 hours. Thyroid Function Tests: No results for input(s): TSH, T4TOTAL, FREET4, T3FREE, THYROIDAB in the last 72 hours. Anemia Panel: No results for input(s): VITAMINB12, FOLATE, FERRITIN, TIBC, IRON, RETICCTPCT in the last 72 hours. Urine analysis:    Component Value Date/Time   COLORURINE YELLOW 02/03/2017 0903   APPEARANCEUR HAZY (A) 02/03/2017 0903   APPEARANCEUR Cloudy 05/21/2013 2205   LABSPEC 1.012 02/03/2017 0903   LABSPEC 1.012 05/21/2013 2205   PHURINE 5.0 02/03/2017 0903   GLUCOSEU NEGATIVE 02/03/2017 0903   GLUCOSEU 150 mg/dL 16/12/9602 5409   HGBUR NEGATIVE 02/03/2017 0903   BILIRUBINUR NEGATIVE 02/03/2017 0903   BILIRUBINUR Negative 05/21/2013 2205   KETONESUR NEGATIVE 02/03/2017 0903   PROTEINUR NEGATIVE 02/03/2017 0903   NITRITE NEGATIVE 02/03/2017 0903   LEUKOCYTESUR TRACE (A) 02/03/2017 0903   LEUKOCYTESUR Negative 05/21/2013 2205     Ripudeep Rai M.D. [facility] 03/16/2017, 10:47 AM  Pager: [phone] Between 7am to 7pm - call Pager - [phone]  After 7pm go to [email or web address] - password [facility]  Call night coverage person covering after 7pm

## 2017-03-17 LAB — BASIC METABOLIC PANEL
Anion gap: 16 — ABNORMAL HIGH (ref 5–15)
BUN: 126 mg/dL — AB (ref 6–20)
CO2: 23 mmol/L (ref 22–32)
CREATININE: 2.66 mg/dL — AB (ref 0.44–1.00)
Calcium: 8.5 mg/dL — ABNORMAL LOW (ref 8.9–10.3)
Chloride: 101 mmol/L (ref 101–111)
GFR calc Af Amer: 17 mL/min — ABNORMAL LOW (ref >60)
GFR, EST NON AFRICAN AMERICAN: 15 mL/min — AB (ref >60)
Glucose, Bld: 194 mg/dL — ABNORMAL HIGH (ref 65–99)
Potassium: 3.7 mmol/L (ref 3.5–5.1)
SODIUM: 140 mmol/L (ref 135–145)

## 2017-03-17 LAB — GLUCOSE, CAPILLARY
GLUCOSE-CAPILLARY: 178 mg/dL — AB (ref 65–99)
GLUCOSE-CAPILLARY: 244 mg/dL — AB (ref 65–99)
GLUCOSE-CAPILLARY: 232 mg/dL — AB (ref 65–99)
Glucose-Capillary: 175 mg/dL — ABNORMAL HIGH (ref 65–99)
Glucose-Capillary: 219 mg/dL — ABNORMAL HIGH (ref 65–99)

## 2017-03-17 MED ORDER — FLUTICASONE PROPIONATE 50 MCG/ACT NA SUSP
1.0000 | Freq: Every day | NASAL | Status: DC | PRN
  Administered 2017-03-17 – 2017-03-19 (×2): 1 via NASAL
  Filled 2017-03-17: qty 16

## 2017-03-17 MED ORDER — INSULIN ASPART 100 UNIT/ML ~~LOC~~ SOLN
3.0000 [IU] | Freq: Three times a day (TID) | SUBCUTANEOUS | Status: DC
  Administered 2017-03-17 – 2017-03-20 (×9): 3 [IU] via SUBCUTANEOUS

## 2017-03-17 MED ORDER — DORZOLAMIDE HCL-TIMOLOL MAL 2-0.5 % OP SOLN
1.0000 [drp] | Freq: Two times a day (BID) | OPHTHALMIC | Status: DC
Start: 2017-03-17 — End: 2017-03-21
  Administered 2017-03-17 – 2017-03-21 (×9): 1 [drp] via OPHTHALMIC
  Filled 2017-03-17: qty 10

## 2017-03-17 MED ORDER — ATORVASTATIN CALCIUM 40 MG PO TABS
40.0000 mg | ORAL_TABLET | Freq: Every day | ORAL | Status: DC
  Administered 2017-03-17 – 2017-03-21 (×5): 40 mg via ORAL
  Filled 2017-03-17 (×5): qty 1

## 2017-03-17 MED ORDER — ESCITALOPRAM OXALATE 10 MG PO TABS
10.0000 mg | ORAL_TABLET | Freq: Every day | ORAL | Status: DC
  Administered 2017-03-17 – 2017-03-21 (×5): 10 mg via ORAL
  Filled 2017-03-17 (×5): qty 1

## 2017-03-17 MED ORDER — DONEPEZIL HCL 5 MG PO TABS
5.0000 mg | ORAL_TABLET | Freq: Every day | ORAL | Status: DC
  Administered 2017-03-17 – 2017-03-21 (×5): 5 mg via ORAL
  Filled 2017-03-17 (×5): qty 1

## 2017-03-17 MED ORDER — INSULIN GLARGINE 100 UNIT/ML ~~LOC~~ SOLN
18.0000 [IU] | SUBCUTANEOUS | Status: DC
Start: 2017-03-18 — End: 2017-03-21
  Administered 2017-03-18 – 2017-03-21 (×4): 18 [IU] via SUBCUTANEOUS
  Filled 2017-03-17 (×5): qty 0.18

## 2017-03-17 MED ORDER — INSULIN ASPART 100 UNIT/ML ~~LOC~~ SOLN
0.0000 [IU] | Freq: Three times a day (TID) | SUBCUTANEOUS | Status: DC
  Administered 2017-03-17 – 2017-03-18 (×3): 5 [IU] via SUBCUTANEOUS
  Administered 2017-03-18: 8 [IU] via SUBCUTANEOUS
  Administered 2017-03-18: 5 [IU] via SUBCUTANEOUS
  Administered 2017-03-18: 8 [IU] via SUBCUTANEOUS
  Administered 2017-03-19: 5 [IU] via SUBCUTANEOUS
  Administered 2017-03-19: 2 [IU] via SUBCUTANEOUS
  Administered 2017-03-19 – 2017-03-20 (×2): 5 [IU] via SUBCUTANEOUS
  Administered 2017-03-20: 3 [IU] via SUBCUTANEOUS
  Administered 2017-03-20: 2 [IU] via SUBCUTANEOUS
  Administered 2017-03-20: 11 [IU] via SUBCUTANEOUS

## 2017-03-17 MED ORDER — LATANOPROST 0.005 % OP SOLN
1.0000 [drp] | Freq: Every day | OPHTHALMIC | Status: DC
  Administered 2017-03-17 – 2017-03-20 (×4): 1 [drp] via OPHTHALMIC
  Filled 2017-03-17: qty 2.5

## 2017-03-17 NOTE — Progress Notes (Signed)
Pt's daughter and granddaughter at bedside - notified that they would like to speak with Corrie DandyMary (palliative care) and setup meeting for today if possible - [phone] with [facility] response - family made aware. Will re-address tomorrow.

## 2017-03-17 NOTE — Progress Notes (Signed)
[facility]                                                                              Patient Demographics  Bethany Nelson, is a 84 y.o. female, DOB - 05/06/1926, NWG:[id number]  Admit date - 03/15/2017   Admitting Physician Bethany PartridgeSrobona Tublu Chatterjee, MD  Outpatient Primary MD for the patient is Bethany Nelson, James, MD  Outpatient specialists:   LOS - 2  days   Medical records reviewed and are as summarized below:    Chief Complaint  Patient presents with  . Shortness of Breath  . Pneumonia       Brief summary   Bethany Nelson is a 84 y.o. female with medical history significant for diabetes on insulin, dementia, stage IV chronic kidney disease, new diagnosis November 2018 of chronic systolic and diastolic heart failure in the context of severe mitral regurgitation.  During that initial diagnosis cardiology was consulted and determined the patient was not a surgical candidate for repair of the mitral valve.  Patient was readmitted on 12/31 for pneumonia, respiratory failure, cardiology was consulted and determined patient was hospice appropriate  given her underlying end-stage CHF and inoperable severe MR.  Family at that time declined home health hospice, patient was just discharged on 1/5.  Patient return back to ED with significant dyspnea, audible wheezing, no significant improvement with albuterol nebs and prednisone at home.  Chest x-ray showed interstitial infiltrates increased consistent with pulmonary edema.  Patient was placed on BiPAP.    Assessment & Plan    Principal Problem:   Acute on chronic combined systolic and diastolic CHF, NYHA class 4 w/ severe pulmonary hypertension secondary to severe mitral regurgitation, elevated troponin - 2D echo 01/2017 showed EF of 25-30% with diffuse hypokinesis, grade 3 diastolic dysfunction -Underlying end-stage CHF, seen by cardiology during previous admission, recommended hospice care.  Daughter conflicted over  transitioning to hospice.  Palliative medicine consulted for goals of care -Continue Lasix IV for diuresis, on 80 mg IV every 12 hours -Negative balance of 1.3 L  Active Problems:    Acute respiratory failure (HCC) with hypoxia -Wheezing improving, continue duo nebs every 6 hours, IV Solu-Medrol, BiPAP as needed - Palliative medicine consulted for goals of care, DNR   diabetes mellitus, insulin dependent (IDDM), uncontrolled (HCC) -CBGs expected to rise with IV Solu-Medrol -Increase Lantus to 18 units daily, add NovoLog meal coverage 3 units 3 times daily AC    CKD (chronic kidney disease) stage IV -Presented with creatinine of 2.99, was 3.2 on 1/2. -Progressive worsening of renal function for last 6 months, creatinine improving from yesterday.  -Creatinine improving 2.6 today    HTN, goal below 140/90 -BP currently controlled    HLD (hyperlipidemia) -Currently n.p.o. hence Lipitor held    FTT (failure to thrive) in adult -Palliative goals of care pending    Pressure injury of skin - pressure injury documented by nursing as echymosis on arms  Code Status: DNR DVT Prophylaxis: Lovenox Family Communication: No family member at the bedside today   Disposition Plan: Awaiting palliative consult  Time Spent in minutes 25 minutes  Procedures:  BiPAP  Consultants:   Palliative medicine  Antimicrobials:      Medications  Scheduled Meds: . enoxaparin (LOVENOX) injection  30 mg Subcutaneous Q24H  . furosemide  80 mg Intravenous Q12H  . insulin aspart  0-15 Units Subcutaneous Q4H  . insulin glargine  16 Units Subcutaneous BH-q7a  . ipratropium-albuterol  3 mL Nebulization BID  . methylPREDNISolone (SOLU-MEDROL) injection  40 mg Intravenous Q8H  . metoprolol tartrate  5 mg Intravenous Q8H  . sodium chloride flush  3 mL Intravenous Q12H   Continuous Infusions: . sodium chloride     PRN Meds:.sodium chloride, acetaminophen, albuterol, ondansetron (ZOFRAN) IV, sodium  chloride flush   Antibiotics   Anti-infectives (From admission, onward)   None        Subjective:   Bethany Nelson was seen and examined today.  Off BiPAP yesterday, feels better this morning.  Wheezing is improving, no fevers. Denies any chest pain, abdominal pain.  No nausea vomiting.  Afebrile  Objective:   Vitals:   03/17/17 0415 03/17/17 0500 03/17/17 0803 03/17/17 0909  BP: 117/69 117/69 111/71   Pulse: 65 70 66   Resp: (!) 26 (!) 23 (!) 22   Temp:  97.8 F (36.6 C)    TempSrc:  Oral    SpO2: 95% 98% 98% 98%  Weight:  83 kg (182 lb 15.7 oz)      Intake/Output Summary (Last 24 hours) at 03/17/2017 1357 Last data filed at 03/17/2017 0700 Gross per 24 hour  Intake 360 ml  Output 1150 ml  Net -790 ml     Wt Readings from Last 3 Encounters:  03/17/17 83 kg (182 lb 15.7 oz)  03/14/17 82.6 kg (182 lb)  02/06/17 83.9 kg (184 lb 15.5 oz)     Exam    General: Alert and oriented x self, NAD  Eyes:   HEENT:    Cardiovascular: S1 S2 clear, RRR,  1+ pedal edema b/l  Respiratory: Scattered wheezing bilaterally improving   Gastrointestinal: Soft, nontender, nondistended, + bowel sounds  Ext: 1+ pedal edema bilaterally  Neuro: no new neuro deficits  Musculoskeletal: No digital cyanosis, clubbing  Skin: No rashes  Psych: Normal affect and demeanor, alert and oriented   Data Reviewed:  I have personally reviewed following labs and imaging studies  Micro Results Recent Results (from the past 240 hour(s))  Culture, blood (routine x 2) Call MD if unable to obtain prior to antibiotics being given     Status: None   Collection Time: 03/09/17  4:24 PM  Result Value Ref Range Status   Specimen Description BLOOD LEFT HAND  Final   Special Requests   Final    BOTTLES DRAWN AEROBIC AND ANAEROBIC Blood Culture adequate volume   Culture NO GROWTH 5 DAYS  Final   Report Status 03/14/2017 FINAL  Final  Culture, blood (routine x 2) Call MD if unable to obtain prior  to antibiotics being given     Status: None   Collection Time: 03/09/17  4:24 PM  Result Value Ref Range Status   Specimen Description BLOOD RIGHT HAND  Final   Special Requests   Final    BOTTLES DRAWN AEROBIC AND ANAEROBIC Blood Culture adequate volume   Culture NO GROWTH 5 DAYS  Final   Report Status 03/14/2017 FINAL  Final  Respiratory Panel by PCR     Status: Abnormal   Collection Time: 03/09/17  4:47 PM  Result Value Ref Range Status   Adenovirus NOT DETECTED NOT DETECTED Final  Coronavirus 229E DETECTED (A) NOT DETECTED Final   Coronavirus HKU1 NOT DETECTED NOT DETECTED Final   Coronavirus NL63 NOT DETECTED NOT DETECTED Final   Coronavirus OC43 NOT DETECTED NOT DETECTED Final   Metapneumovirus NOT DETECTED NOT DETECTED Final   Rhinovirus / Enterovirus NOT DETECTED NOT DETECTED Final   Influenza A NOT DETECTED NOT DETECTED Final   Influenza B NOT DETECTED NOT DETECTED Final   Parainfluenza Virus 1 NOT DETECTED NOT DETECTED Final   Parainfluenza Virus 2 NOT DETECTED NOT DETECTED Final   Parainfluenza Virus 3 NOT DETECTED NOT DETECTED Final   Parainfluenza Virus 4 NOT DETECTED NOT DETECTED Final   Respiratory Syncytial Virus NOT DETECTED NOT DETECTED Final   Bordetella pertussis NOT DETECTED NOT DETECTED Final   Chlamydophila pneumoniae NOT DETECTED NOT DETECTED Final   Mycoplasma pneumoniae NOT DETECTED NOT DETECTED Final  MRSA PCR Screening     Status: None   Collection Time: 03/12/17 10:22 AM  Result Value Ref Range Status   MRSA by PCR NEGATIVE NEGATIVE Final    Comment:        The GeneXpert MRSA Assay (FDA approved for NASAL specimens only), is one component of a comprehensive MRSA colonization surveillance program. It is not intended to diagnose MRSA infection nor to guide or monitor treatment for MRSA infections.   MRSA PCR Screening     Status: None   Collection Time: 03/15/17  6:48 PM  Result Value Ref Range Status   MRSA by PCR NEGATIVE NEGATIVE Final     Comment:        The GeneXpert MRSA Assay (FDA approved for NASAL specimens only), is one component of a comprehensive MRSA colonization surveillance program. It is not intended to diagnose MRSA infection nor to guide or monitor treatment for MRSA infections.     Radiology Reports Dg Chest 2 View  Result Date: 03/10/2017 CLINICAL DATA:  Heart failure EXAM: CHEST  2 VIEW COMPARISON:  03/09/2017 FINDINGS: Cardiac shadow is enlarged but stable. Increasing vascular congestion with interstitial edema is noted consistent with congestive failure. Aortic calcifications and mitral valve calcifications are again seen. Persistent left lower lobe infiltrate is noted. IMPRESSION: Increasing congestive failure. Electronically Signed   By: Alcide Clever M.D.   On: 03/10/2017 09:29   Dg Chest 2 View  Result Date: 03/09/2017 CLINICAL DATA:  Shortness of breath and cough for 2 days EXAM: CHEST  2 VIEW COMPARISON:  02/03/2017 FINDINGS: Normal heart size. Aortic atherosclerosis. Small left pleural effusion. Opacity within the posterior left lower lobe identified compatible is identified on the lateral radiograph. Diffuse chronic interstitial coarsening identified bilaterally. IMPRESSION: 1. Small left pleural effusion and left lower lobe airspace opacity which may represent pneumonia 2.  Aortic Atherosclerosis (ICD10-I70.0). Electronically Signed   By: Signa Kell M.D.   On: 03/09/2017 11:23   Dg Chest Portable 1 View  Result Date: 03/15/2017 CLINICAL DATA:  Increased shortness of breath, COPD exacerbation, history CHF, diabetes mellitus, hypertension, stage IV chronic kidney disease EXAM: PORTABLE CHEST 1 VIEW COMPARISON:  Portable exam 1346 hours compared to 03/11/2017 FINDINGS: Enlargement of cardiac silhouette with pulmonary vascular congestion. Interstitial infiltrates increased since previous exam favoring pulmonary edema. Central peribronchial thickening. Atherosclerotic calcification aorta. No gross  pleural effusion or pneumothorax. Bones demineralized. IMPRESSION: Probable CHF. Electronically Signed   By: Ulyses Southward M.D.   On: 03/15/2017 14:19   Dg Chest Port 1 View  Result Date: 03/11/2017 CLINICAL DATA:  84 year old female with acute respiratory failure. Subsequent encounter. EXAM:  PORTABLE CHEST 1 VIEW COMPARISON:  03/10/2017. FINDINGS: Interval decrease in degree of pulmonary vascular congestion. Cardiomegaly.  Mitral valve calcification. Calcified tortuous aorta. Bilateral shoulder joint degenerative changes. IMPRESSION: Interval decrease in degree of pulmonary vascular congestion. Cardiomegaly. Aortic Atherosclerosis (ICD10-I70.0). Electronically Signed   By: Lacy Duverney M.D.   On: 03/11/2017 18:13    Lab Data:  CBC: Recent Labs  Lab 03/11/17 0728 03/15/17 1320  WBC 4.4 10.7*  NEUTROABS  --  9.2*  HGB 9.8* 9.9*  HCT 32.4* 32.9*  MCV 101.3* 98.8  PLT 102* 142*   Basic Metabolic Panel: Recent Labs  Lab 03/13/17 0556 03/14/17 0524 03/15/17 1320 03/16/17 0425 03/17/17 0535  NA 140 141 136 143 140  K 3.7 3.8 3.6 3.5 3.7  CL 104 101 99* 105 101  CO2 26 26 25 28 23   GLUCOSE 139* 180* 337* 182* 194*  BUN 104* 112* 119* 120* 126*  CREATININE 2.93* 2.98* 2.99* 2.82* 2.66*  CALCIUM 8.5* 8.6* 8.5* 8.7* 8.5*   GFR: Estimated Creatinine Clearance: 15.6 mL/min (A) (by C-G formula based on SCr of 2.66 mg/dL (H)). Liver Function Tests: Recent Labs  Lab 03/15/17 1320  AST 23  ALT 18  ALKPHOS 40  BILITOT 0.8  PROT 6.0*  ALBUMIN 3.0*   No results for input(s): LIPASE, AMYLASE in the last 168 hours. No results for input(s): AMMONIA in the last 168 hours. Coagulation Profile: No results for input(s): INR, PROTIME in the last 168 hours. Cardiac Enzymes: Recent Labs  Lab 03/15/17 1320  TROPONINI 1.29*   BNP (last 3 results) Recent Labs    07/03/16 1350  PROBNP 4,153*   HbA1C: No results for input(s): HGBA1C in the last 72 hours. CBG: Recent Labs  Lab  03/16/17 1135 03/16/17 2222 03/17/17 0415 03/17/17 0800 03/17/17 1152  GLUCAP 106* 262* 178* 175* 219*   Lipid Profile: No results for input(s): CHOL, HDL, LDLCALC, TRIG, CHOLHDL, LDLDIRECT in the last 72 hours. Thyroid Function Tests: No results for input(s): TSH, T4TOTAL, FREET4, T3FREE, THYROIDAB in the last 72 hours. Anemia Panel: No results for input(s): VITAMINB12, FOLATE, FERRITIN, TIBC, IRON, RETICCTPCT in the last 72 hours. Urine analysis:    Component Value Date/Time   COLORURINE YELLOW 02/03/2017 0903   APPEARANCEUR HAZY (A) 02/03/2017 0903   APPEARANCEUR Cloudy 05/21/2013 2205   LABSPEC 1.012 02/03/2017 0903   LABSPEC 1.012 05/21/2013 2205   PHURINE 5.0 02/03/2017 0903   GLUCOSEU NEGATIVE 02/03/2017 0903   GLUCOSEU 150 mg/dL 16/12/9602 5409   HGBUR NEGATIVE 02/03/2017 0903   BILIRUBINUR NEGATIVE 02/03/2017 0903   BILIRUBINUR Negative 05/21/2013 2205   KETONESUR NEGATIVE 02/03/2017 0903   PROTEINUR NEGATIVE 02/03/2017 0903   NITRITE NEGATIVE 02/03/2017 0903   LEUKOCYTESUR TRACE (A) 02/03/2017 0903   LEUKOCYTESUR Negative 05/21/2013 2205     Jacai Kipp M.D. [facility] 03/17/2017, 1:57 PM  Pager: [phone] Between 7am to 7pm - call Pager - [phone]  After 7pm go to [email or web address] - password [email or web address]  Call night coverage person covering after 7pm

## 2017-03-18 DIAGNOSIS — I10 Essential (primary) hypertension: Secondary | ICD-10-CM

## 2017-03-18 LAB — BASIC METABOLIC PANEL
Anion gap: 10 (ref 5–15)
BUN: 132 mg/dL — AB (ref 6–20)
CHLORIDE: 101 mmol/L (ref 101–111)
CO2: 29 mmol/L (ref 22–32)
Calcium: 8.5 mg/dL — ABNORMAL LOW (ref 8.9–10.3)
Creatinine, Ser: 2.68 mg/dL — ABNORMAL HIGH (ref 0.44–1.00)
GFR calc Af Amer: 17 mL/min — ABNORMAL LOW (ref >60)
GFR calc non Af Amer: 15 mL/min — ABNORMAL LOW (ref >60)
GLUCOSE: 209 mg/dL — AB (ref 65–99)
POTASSIUM: 3.5 mmol/L (ref 3.5–5.1)
SODIUM: 140 mmol/L (ref 135–145)

## 2017-03-18 LAB — GLUCOSE, CAPILLARY
GLUCOSE-CAPILLARY: 249 mg/dL — AB (ref 65–99)
GLUCOSE-CAPILLARY: 270 mg/dL — AB (ref 65–99)
Glucose-Capillary: 216 mg/dL — ABNORMAL HIGH (ref 65–99)
Glucose-Capillary: 269 mg/dL — ABNORMAL HIGH (ref 65–99)

## 2017-03-18 MED ORDER — PREDNISONE 20 MG PO TABS
50.0000 mg | ORAL_TABLET | Freq: Every day | ORAL | Status: AC
  Administered 2017-03-19 – 2017-03-20 (×2): 50 mg via ORAL
  Filled 2017-03-18 (×2): qty 1

## 2017-03-18 NOTE — Progress Notes (Addendum)
PROGRESS NOTE    Bethany Nelson  NWG:[id number] DOB: 11-Apr-1926 DOA: 03/15/2017 PCP: Jerl Mina, MD   Brief Narrative: 84 y.o.femalewith medical history significant fordiabetes on insulin, dementia, stage IV chronic kidney disease, new diagnosis November 2018 of chronic systolic and diastolic heart failure in the context of severe mitral regurgitation. During that initial diagnosis cardiology was consulted and determined the patient was not a surgical candidate for repair of the mitral valve.  Patient was readmitted on 12/31 for pneumonia, respiratory failure, cardiology was consulted and determined patient was hospice appropriate given her underlying end-stage CHF and inoperable severe MR.  Family at that time declined home health hospice, patient was just discharged on 1/5.  Patient return back to ED with significant dyspnea, audible wheezing, no significant improvement with albuterol nebs and prednisone at home.  Chest x-ray showed interstitial infiltrates increased consistent with pulmonary edema.  Patient was placed on BiPAP.   Assessment & Plan:   #Acute on chronic combined systolic and diastolic congestive heart failure, with severe pulmonary hypertension secondary to severe mitral regurgitation/elevated troponin: -EF 25-30% with diffuse hypokinesis, grade 3 diastolic dysfunction.  Seen by cardiologist and previous admission recommended hospice care.  Discussed with the patient's daughter today at baseline.  Palliative care consulted.  Patient will likely benefit from home hospice.  For now continue IV Lasix.  Renal function is stable.  Monitor urine output.  #Acute respiratory failure with hypoxia: Continue oxygen.  This likely contributed by congestive heart failure.  I will discontinue Solu-Medrol and switch to oral prednisone.  Continue bronchodilators.  Currently in room air.  Oxygen as needed.  #Insulin-dependent diabetes, uncontrolled due to hyperglycemia: Continue current  insulin regimen.  Oral steroid.  Monitor blood sugar level.  #Chronic kidney disease stage IV: Serum creatinine level improving.  Monitor BMP.  Avoid for toxins.  #Essential hypertension: Monitor blood pressure.  Continue current medication.  #Failure to thrive: PT OT evaluation.  Palliative care  #Pressure injury of his skin documented as ecchymosis on arms: Continue supportive care.  #Hyperlipidemia: Continue statin.  #Dementia without behavioral disturbance, likely age related: Continue Aricept and supportive care.  #Coughing spells today rule out aspiration: Order a speech and swallow eval.  Aspiration precaution.  DVT prophylaxis: Lovenox subcutaneous Code Status: DNR Family Communication: Discussed with the patient's daughter at bedside Disposition Plan: Admitted    Consultants:   Cardiology  Procedures: None Antimicrobials: None  Subjective: Seen and examined at bedside.  Patient reported coughing and feeling tired.  Denied nausea vomiting chest pain.  Daughter at bedside.  Objective: Vitals:   03/18/17 0005 03/18/17 0419 03/18/17 0739 03/18/17 1040  BP: 110/60 (!) 144/68 139/68   Pulse: 65 67 63   Resp: 17 (!) 22 12   Temp: 98.5 F (36.9 C) 97.7 F (36.5 C) 97.8 F (36.6 C)   TempSrc: Oral Oral Oral   SpO2: 93% 91% 96% 98%  Weight:  83.4 kg (183 lb 13.8 oz)    Height:        Intake/Output Summary (Last 24 hours) at 03/18/2017 1105 Last data filed at 03/18/2017 0400 Gross per 24 hour  Intake 120 ml  Output 600 ml  Net -480 ml   Filed Weights   03/15/17 1305 03/17/17 0500 03/18/17 0419  Weight: 82.6 kg (182 lb) 83 kg (182 lb 15.7 oz) 83.4 kg (183 lb 13.8 oz)    Examination:  General exam: Ill looking elderly female sitting in bed with intermittent coughing spell Respiratory system: Bilateral diffuse rhonchi  with occasional wheeze. Cardiovascular system: S1 & S2 heard, RRR.  No pedal edema. Gastrointestinal system: Abdomen is nondistended, soft and  nontender. Normal bowel sounds heard. Central nervous system: Alert awake and following commands Skin: No rashes, lesions or ulcers Psychiatry:Mood & affect appropriate.     Data Reviewed: I have personally reviewed following labs and imaging studies  CBC: Recent Labs  Lab 03/15/17 1320  WBC 10.7*  NEUTROABS 9.2*  HGB 9.9*  HCT 32.9*  MCV 98.8  PLT 142*   Basic Metabolic Panel: Recent Labs  Lab 03/14/17 0524 03/15/17 1320 03/16/17 0425 03/17/17 0535 03/18/17 0347  NA 141 136 143 140 140  K 3.8 3.6 3.5 3.7 3.5  CL 101 99* 105 101 101  CO2 26 25 28 23 29   GLUCOSE 180* 337* 182* 194* 209*  BUN 112* 119* 120* 126* 132*  CREATININE 2.98* 2.99* 2.82* 2.66* 2.68*  CALCIUM 8.6* 8.5* 8.7* 8.5* 8.5*   GFR: Estimated Creatinine Clearance: 15.5 mL/min (A) (by C-G formula based on SCr of 2.68 mg/dL (H)). Liver Function Tests: Recent Labs  Lab 03/15/17 1320  AST 23  ALT 18  ALKPHOS 40  BILITOT 0.8  PROT 6.0*  ALBUMIN 3.0*   No results for input(s): LIPASE, AMYLASE in the last 168 hours. No results for input(s): AMMONIA in the last 168 hours. Coagulation Profile: No results for input(s): INR, PROTIME in the last 168 hours. Cardiac Enzymes: Recent Labs  Lab 03/15/17 1320  TROPONINI 1.29*   BNP (last 3 results) Recent Labs    07/03/16 1350  PROBNP 4,153*   HbA1C: No results for input(s): HGBA1C in the last 72 hours. CBG: Recent Labs  Lab 03/17/17 0800 03/17/17 1152 03/17/17 1647 03/17/17 2115 03/18/17 0737  GLUCAP 175* 219* 244* 232* 216*   Lipid Profile: No results for input(s): CHOL, HDL, LDLCALC, TRIG, CHOLHDL, LDLDIRECT in the last 72 hours. Thyroid Function Tests: No results for input(s): TSH, T4TOTAL, FREET4, T3FREE, THYROIDAB in the last 72 hours. Anemia Panel: No results for input(s): VITAMINB12, FOLATE, FERRITIN, TIBC, IRON, RETICCTPCT in the last 72 hours. Sepsis Labs: Recent Labs  Lab 03/15/17 1352  LATICACIDVEN 1.49    Recent  Results (from the past 240 hour(s))  Culture, blood (routine x 2) Call MD if unable to obtain prior to antibiotics being given     Status: None   Collection Time: 03/09/17  4:24 PM  Result Value Ref Range Status   Specimen Description BLOOD LEFT HAND  Final   Special Requests   Final    BOTTLES DRAWN AEROBIC AND ANAEROBIC Blood Culture adequate volume   Culture NO GROWTH 5 DAYS  Final   Report Status 03/14/2017 FINAL  Final  Culture, blood (routine x 2) Call MD if unable to obtain prior to antibiotics being given     Status: None   Collection Time: 03/09/17  4:24 PM  Result Value Ref Range Status   Specimen Description BLOOD RIGHT HAND  Final   Special Requests   Final    BOTTLES DRAWN AEROBIC AND ANAEROBIC Blood Culture adequate volume   Culture NO GROWTH 5 DAYS  Final   Report Status 03/14/2017 FINAL  Final  Respiratory Panel by PCR     Status: Abnormal   Collection Time: 03/09/17  4:47 PM  Result Value Ref Range Status   Adenovirus NOT DETECTED NOT DETECTED Final   Coronavirus 229E DETECTED (A) NOT DETECTED Final   Coronavirus HKU1 NOT DETECTED NOT DETECTED Final   Coronavirus NL63 NOT  DETECTED NOT DETECTED Final   Coronavirus OC43 NOT DETECTED NOT DETECTED Final   Metapneumovirus NOT DETECTED NOT DETECTED Final   Rhinovirus / Enterovirus NOT DETECTED NOT DETECTED Final   Influenza A NOT DETECTED NOT DETECTED Final   Influenza B NOT DETECTED NOT DETECTED Final   Parainfluenza Virus 1 NOT DETECTED NOT DETECTED Final   Parainfluenza Virus 2 NOT DETECTED NOT DETECTED Final   Parainfluenza Virus 3 NOT DETECTED NOT DETECTED Final   Parainfluenza Virus 4 NOT DETECTED NOT DETECTED Final   Respiratory Syncytial Virus NOT DETECTED NOT DETECTED Final   Bordetella pertussis NOT DETECTED NOT DETECTED Final   Chlamydophila pneumoniae NOT DETECTED NOT DETECTED Final   Mycoplasma pneumoniae NOT DETECTED NOT DETECTED Final  MRSA PCR Screening     Status: None   Collection Time: 03/12/17  10:22 AM  Result Value Ref Range Status   MRSA by PCR NEGATIVE NEGATIVE Final    Comment:        The GeneXpert MRSA Assay (FDA approved for NASAL specimens only), is one component of a comprehensive MRSA colonization surveillance program. It is not intended to diagnose MRSA infection nor to guide or monitor treatment for MRSA infections.   MRSA PCR Screening     Status: None   Collection Time: 03/15/17  6:48 PM  Result Value Ref Range Status   MRSA by PCR NEGATIVE NEGATIVE Final    Comment:        The GeneXpert MRSA Assay (FDA approved for NASAL specimens only), is one component of a comprehensive MRSA colonization surveillance program. It is not intended to diagnose MRSA infection nor to guide or monitor treatment for MRSA infections.          Radiology Studies: No results found.      Scheduled Meds: . atorvastatin  40 mg Oral Daily  . donepezil  5 mg Oral Daily  . dorzolamide-timolol  1 drop Both Eyes BID  . enoxaparin (LOVENOX) injection  30 mg Subcutaneous Q24H  . escitalopram  10 mg Oral Daily  . furosemide  80 mg Intravenous Q12H  . insulin aspart  0-15 Units Subcutaneous TID AC & HS  . insulin aspart  3 Units Subcutaneous TID WC  . insulin glargine  18 Units Subcutaneous BH-q7a  . ipratropium-albuterol  3 mL Nebulization BID  . latanoprost  1 drop Both Eyes QHS  . methylPREDNISolone (SOLU-MEDROL) injection  40 mg Intravenous Q8H  . metoprolol tartrate  5 mg Intravenous Q8H  . sodium chloride flush  3 mL Intravenous Q12H   Continuous Infusions: . sodium chloride       LOS: 3 days    Skyelyn Scruggs Jaynie CollinsPrasad Zain Lankford, MD [facility] Pager [phone]  If 7PM-7AM, please contact night-coverage [email or web address] Password [email or web address] 03/18/2017, 11:05 AM

## 2017-03-18 NOTE — Evaluation (Signed)
Physical Therapy Evaluation Patient Details Name: Bethany Nelson MRN: [id number] DOB: 12/04/1926 Today's Date: 03/18/2017   History of Present Illness  Pt is an 84 y/o female with a PMH significant for DM, dementia, CKD IV, chronic systolic and diastolic heart failure in the context of severe mitral regurgitation (not a surgical candidate). Multiple recent admissions, the latest d/c was 1/5. She returned back to the ED 1 day later with dyspnea, and chest x-ray suggested pulmonary edema.   Clinical Impression  Pt admitted with above diagnosis. Pt currently with functional limitations due to the deficits listed below (see PT Problem List). At the time of PT eval pt was able to perform transfers with +2 mod assist for balance support and safety. Pt's daughter present during session and reports they have been managing well at home, however she is looking to extend the caregiver's hours to provide more coverage until daughter gets home from work. Pt will benefit from skilled PT to increase their independence and safety with mobility to allow discharge to the venue listed below.       Follow Up Recommendations Home health PT;Supervision/Assistance - 24 hour    Equipment Recommendations  None recommended by PT    Recommendations for Other Services       Precautions / Restrictions Precautions Precautions: Fall Restrictions Weight Bearing Restrictions: No      Mobility  Bed Mobility Overal bed mobility: Needs Assistance Bed Mobility: Supine to Sit     Supine to sit: Min assist;+2 for physical assistance     General bed mobility comments: Assist for transition to EOB. Hand-over-hand assist to reach for bed rail for support, and bed pad used to assist in rotating hips around to EOB.   Transfers Overall transfer level: Needs assistance Equipment used: 2 person hand held assist Transfers: Sit to/from UGI CorporationStand;Stand Pivot Transfers Sit to Stand: +2 physical assistance;Mod assist Stand pivot  transfers: +2 physical assistance;Mod assist       General transfer comment: +2 assist for power-up to full stand. Pt was able to take pivotal steps around to the recliner chair, and increased time was required to initiate movement.   Ambulation/Gait                Stairs            Wheelchair Mobility    Modified Rankin (Stroke Patients Only)       Balance Overall balance assessment: Needs assistance Sitting-balance support: Bilateral upper extremity supported;Feet supported Sitting balance-Leahy Scale: Fair     Standing balance support: Bilateral upper extremity supported;During functional activity Standing balance-Leahy Scale: Poor Standing balance comment: reliant on UE support, very limited by fatigue                              Pertinent Vitals/Pain Pain Assessment: No/denies pain    Home Living Family/patient expects to be discharged to:: Private residence Living Arrangements: Children;Other relatives Available Help at Discharge: Family;Available 24 hours/day;Personal care attendant Type of Home: House Home Access: Stairs to enter   Entrance Stairs-Number of Steps: 1 Home Layout: Two level;Able to live on main level with bedroom/bathroom Home Equipment: Shower seat;Walker - 4 wheels Additional Comments: PCA 5 days/week from 8-3    Prior Function Level of Independence: Needs assistance   Gait / Transfers Assistance Needed: uses a rollator to ambulate  ADL's / Homemaking Assistance Needed: needs assist bathing, can dress herself with occasional assist from her daughter  Hand Dominance   Dominant Hand: Right    Extremity/Trunk Assessment   Upper Extremity Assessment Upper Extremity Assessment: Generalized weakness    Lower Extremity Assessment Lower Extremity Assessment: Generalized weakness    Cervical / Trunk Assessment Cervical / Trunk Assessment: Kyphotic  Communication   Communication: HOH  Cognition  Arousal/Alertness: Awake/alert Behavior During Therapy: Flat affect Overall Cognitive Status: History of cognitive impairments - at baseline                                        General Comments      Exercises     Assessment/Plan    PT Assessment Patient needs continued PT services  PT Problem List Decreased strength;Decreased range of motion;Decreased activity tolerance;Decreased balance;Decreased mobility;Decreased knowledge of use of DME;Decreased safety awareness;Decreased knowledge of precautions       PT Treatment Interventions DME instruction;Gait training;Stair training;Functional mobility training;Therapeutic activities;Therapeutic exercise;Neuromuscular re-education;Patient/family education    PT Goals (Current goals can be found in the Care Plan section)  Acute Rehab PT Goals Patient Stated Goal: to go home  PT Goal Formulation: With patient Time For Goal Achievement: 04/01/17 Potential to Achieve Goals: Good    Frequency Min 3X/week   Barriers to discharge        Co-evaluation               AM-PAC PT "6 Clicks" Daily Activity  Outcome Measure Difficulty turning over in bed (including adjusting bedclothes, sheets and blankets)?: Unable Difficulty moving from lying on back to sitting on the side of the bed? : Unable Difficulty sitting down on and standing up from a chair with arms (e.g., wheelchair, bedside commode, etc,.)?: Unable Help needed moving to and from a bed to chair (including a wheelchair)?: A Lot Help needed walking in hospital room?: A Lot Help needed climbing 3-5 steps with a railing? : Total 6 Click Score: 8    End of Session Equipment Utilized During Treatment: Gait belt Activity Tolerance: Patient limited by fatigue Patient left: in chair;with chair alarm set;with call bell/phone within reach;with family/visitor present Nurse Communication: Mobility status PT Visit Diagnosis: Unsteadiness on feet (R26.81);Adult,  failure to thrive (R62.7)    Time: 1610-9604 PT Time Calculation (min) (ACUTE ONLY): 21 min   Charges:   PT Evaluation $PT Eval Moderate Complexity: 1 Mod     PT G Codes:        Bethany Nelson, PT, DPT Acute Rehabilitation Services Pager: [phone]   Bethany Nelson 03/18/2017, 3:01 PM

## 2017-03-18 NOTE — Evaluation (Signed)
Clinical/Bedside Swallow Evaluation Patient Details  Name: Bethany Nelson MRN: [id number] Date of Birth: 1926/12/31  Today's Date: 03/18/2017 Time: SLP Start Time (ACUTE ONLY): 1435 SLP Stop Time (ACUTE ONLY): 1450 SLP Time Calculation (min) (ACUTE ONLY): 15 min  Past Medical History:  Past Medical History:  Diagnosis Date  . Acute respiratory failure (HCC) 03/16/2017  . Chronic diastolic congestive heart failure (HCC)    a. 02/2016: echo showing normal EF, Grade 3 DD, mild MR, trivial TR  . CKD (chronic kidney disease), stage IV (HCC)   . Dementia   . Diabetes mellitus without complication (HCC)   . Hypertension   . Pneumonia 03/10/2017   Past Surgical History:  Past Surgical History:  Procedure Laterality Date  . CATARACT EXTRACTION    . THYROID SURGERY     HPI:  84 y.o.femalewith medical history significant fordiabetes on insulin, dementia, stage IV chronic kidney disease, new diagnosis November 2018 of chronic systolic and diastolic heart failure in the context of severe mitral regurgitation.Readmitted on 12/31 for pneumonia, respiratory failure, cardiology was consulted and determined patient was hospice appropriategiven her underlying end-stage CHF and inoperable severe MR. Family at that time declined home health hospice, patient was just discharged on 1/5. Patient return back to ED 1/6 with acute respiratory failure. Noted to be coughing with meals.   Assessment / Plan / Recommendation Clinical Impression  Pt presented with functional oropharyngeal swallow with adequate mastication, brisk swallow response, no overt s/s of aspiration, even when taxed with sequential boluses using a straw.  Discussed with her daughter basic strategies to improve safety with eating should difficulty recur.  No further SLP needs identified - continue current diet; give meds whole with puree.  SLP Visit Diagnosis: Dysphagia, unspecified (R13.10)    Aspiration Risk       Diet Recommendation    regular diet, thin liquids  Medication Administration: Whole meds with puree    Other  Recommendations Oral Care Recommendations: Oral care BID   Follow up Recommendations None      Frequency and Duration            Prognosis        Swallow Study   General Date of Onset: 03/15/17 HPI: 84 y.o.femalewith medical history significant fordiabetes on insulin, dementia, stage IV chronic kidney disease, new diagnosis November 2018 of chronic systolic and diastolic heart failure in the context of severe mitral regurgitation.Readmitted on 12/31 for pneumonia, respiratory failure, cardiology was consulted and determined patient was hospice appropriategiven her underlying end-stage CHF and inoperable severe MR. Family at that time declined home health hospice, patient was just discharged on 1/5. Patient return back to ED 1/6 with acute respiratory failure. Noted to be coughing with meals. Type of Study: Bedside Swallow Evaluation Previous Swallow Assessment: no Diet Prior to this Study: Regular;Thin liquids Temperature Spikes Noted: No Respiratory Status: Nasal cannula History of Recent Intubation: No Behavior/Cognition: Alert;Cooperative Oral Cavity Assessment: Within Functional Limits Oral Care Completed by SLP: No Oral Cavity - Dentition: Adequate natural dentition Vision: Functional for self-feeding Self-Feeding Abilities: Able to feed self Patient Positioning: Upright in chair Baseline Vocal Quality: Normal Volitional Cough: Strong Volitional Swallow: Able to elicit    Oral/Motor/Sensory Function Overall Oral Motor/Sensory Function: Within functional limits   Ice Chips Ice chips: Within functional limits   Thin Liquid Thin Liquid: Within functional limits    Nectar Thick Nectar Thick Liquid: Not tested   Honey Thick Honey Thick Liquid: Not tested   Puree Puree: Within functional  limits   Solid   GO   Solid: Within functional limits        Blenda MountsCouture, Rony Ratz  Laurice 03/18/2017,3:10 PM

## 2017-03-19 DIAGNOSIS — Z515 Encounter for palliative care: Secondary | ICD-10-CM

## 2017-03-19 LAB — BASIC METABOLIC PANEL
ANION GAP: 11 (ref 5–15)
BUN: 133 mg/dL — ABNORMAL HIGH (ref 6–20)
CHLORIDE: 101 mmol/L (ref 101–111)
CO2: 31 mmol/L (ref 22–32)
Calcium: 8.8 mg/dL — ABNORMAL LOW (ref 8.9–10.3)
Creatinine, Ser: 2.54 mg/dL — ABNORMAL HIGH (ref 0.44–1.00)
GFR, EST AFRICAN AMERICAN: 18 mL/min — AB (ref >60)
GFR, EST NON AFRICAN AMERICAN: 16 mL/min — AB (ref >60)
Glucose, Bld: 87 mg/dL (ref 65–99)
POTASSIUM: 3 mmol/L — AB (ref 3.5–5.1)
SODIUM: 143 mmol/L (ref 135–145)

## 2017-03-19 LAB — GLUCOSE, CAPILLARY
GLUCOSE-CAPILLARY: 66 mg/dL (ref 65–99)
GLUCOSE-CAPILLARY: 103 mg/dL — AB (ref 65–99)
Glucose-Capillary: 125 mg/dL — ABNORMAL HIGH (ref 65–99)
Glucose-Capillary: 198 mg/dL — ABNORMAL HIGH (ref 65–99)
Glucose-Capillary: 221 mg/dL — ABNORMAL HIGH (ref 65–99)

## 2017-03-19 LAB — MAGNESIUM: Magnesium: 2.6 mg/dL — ABNORMAL HIGH (ref 1.7–2.4)

## 2017-03-19 MED ORDER — POTASSIUM CHLORIDE 20 MEQ/15ML (10%) PO SOLN
40.0000 meq | Freq: Three times a day (TID) | ORAL | Status: DC
  Administered 2017-03-19 – 2017-03-21 (×6): 40 meq via ORAL
  Filled 2017-03-19 (×7): qty 30

## 2017-03-19 NOTE — Progress Notes (Addendum)
PROGRESS NOTE    Bethany Nelson  WUJ:[id number] DOB: 04/09/1926 DOA: 03/15/2017 PCP: Jerl MinaHedrick, James, MD   Brief Narrative: 84 y.o.femalewith medical history significant fordiabetes on insulin, dementia, stage IV chronic kidney disease, new diagnosis November 2018 of chronic systolic and diastolic heart failure in the context of severe mitral regurgitation. During that initial diagnosis cardiology was consulted and determined the patient was not a surgical candidate for repair of the mitral valve.  Patient was readmitted on 12/31 for pneumonia, respiratory failure, cardiology was consulted and determined patient was hospice appropriate given her underlying end-stage CHF and inoperable severe MR.  Family at that time declined home health hospice, patient was just discharged on 1/5.  Patient return back to ED with significant dyspnea, audible wheezing, no significant improvement with albuterol nebs and prednisone at home.  Chest x-ray showed interstitial infiltrates increased consistent with pulmonary edema.  Patient was placed on BiPAP.   Assessment & Plan:   #Acute on chronic combined systolic and diastolic congestive heart failure, with severe pulmonary hypertension secondary to severe mitral regurgitation/elevated troponin: -EF 25-30% with diffuse hypokinesis, grade 3 diastolic dysfunction.  Seen by cardiologist and previous admission recommended hospice care.   -Continue IV Lasix.  Shortness of breath is better.  Creatinine is improving. -Palliative care evaluation ongoing.  PT OT evaluation.  Discussed with the patient's daughter today.  Family wanting her to go to rehab/SNF.  Social worker consulted.  #Acute respiratory failure with hypoxia: Improved.  Patient is on room air.  On short course of prednisone.  Continue bronchodilators.  Oxygen if needed.    #Insulin-dependent diabetes, uncontrolled due to hyperglycemia: Continue current insulin regimen.  Oral steroid.  Monitor blood sugar  level.  #Chronic kidney disease stage IV: Serum creatinine level improving.  Monitor BMP.  Avoid nephrotoxins  #Hypokalemia in the setting of diuretics.  Check magnesium level.  Start liquid potassium chloride.  Repeat lab.  #Essential hypertension: Monitor blood pressure.  Continue current medication.  #Failure to thrive: PT OT evaluation.  Palliative care  #Pressure injury of his skin documented as ecchymosis on arms: Continue supportive care.  #Hyperlipidemia: Continue statin.  #Dementia without behavioral disturbance, likely age related: Continue Aricept and supportive care.  #Coughing spells today rule out aspiration: Improved.  She had by swallow team.  DVT prophylaxis: Lovenox subcutaneous Code Status: DNR Family Communication: Called patient's daughter and discussed with her in detail. Disposition Plan: Admitted    Consultants:   Cardiology  Procedures: None Antimicrobials: None  Subjective: Seen and examined at bedside.  Patient's caregiver at bedside.  Patient reported generalized weakness.  Mild cough.  No shortness of breath.  No chest pain.  No nausea or vomiting.  Objective: Vitals:   03/18/17 2004 03/18/17 2352 03/19/17 0435 03/19/17 0845  BP: 135/67 (!) 145/71 134/71 136/73  Pulse: (!) 59 (!) 56 (!) 50 (!) 57  Resp: (!) 21 20 19  (!) 21  Temp: (!) 97.5 F (36.4 C) 97.7 F (36.5 C) 97.8 F (36.6 C) 97.6 F (36.4 C)  TempSrc: Axillary Oral Oral Oral  SpO2: 100% 99% 95% 95%  Weight:   83 kg (182 lb 15.7 oz)   Height:        Intake/Output Summary (Last 24 hours) at 03/19/2017 1023 Last data filed at 03/19/2017 0847 Gross per 24 hour  Intake 480 ml  Output 1400 ml  Net -920 ml   Filed Weights   03/17/17 0500 03/18/17 0419 03/19/17 0435  Weight: 83 kg (182 lb 15.7 oz)  83.4 kg (183 lb 13.8 oz) 83 kg (182 lb 15.7 oz)    Examination:  General exam: Elderly female sitting on bed comfortable, not in distress Respiratory system: Bibasilar decreased  breath sound, rhonchi better. Cardiovascular system: Regular rate rhythm S1-S2 normal.  No pedal edema.. Gastrointestinal system: Abdomen is nondistended, soft and nontender. Normal bowel sounds heard. Central nervous system: Alert awake along simple commands. Skin: No rashes, lesions or ulcers Psychiatry:Mood & affect appropriate.     Data Reviewed: I have personally reviewed following labs and imaging studies  CBC: Recent Labs  Lab 03/15/17 1320  WBC 10.7*  NEUTROABS 9.2*  HGB 9.9*  HCT 32.9*  MCV 98.8  PLT 142*   Basic Metabolic Panel: Recent Labs  Lab 03/15/17 1320 03/16/17 0425 03/17/17 0535 03/18/17 0347 03/19/17 0833  NA 136 143 140 140 143  K 3.6 3.5 3.7 3.5 3.0*  CL 99* 105 101 101 101  CO2 25 28 23 29 31   GLUCOSE 337* 182* 194* 209* 87  BUN 119* 120* 126* 132* 133*  CREATININE 2.99* 2.82* 2.66* 2.68* 2.54*  CALCIUM 8.5* 8.7* 8.5* 8.5* 8.8*   GFR: Estimated Creatinine Clearance: 16.3 mL/min (A) (by C-G formula based on SCr of 2.54 mg/dL (H)). Liver Function Tests: Recent Labs  Lab 03/15/17 1320  AST 23  ALT 18  ALKPHOS 40  BILITOT 0.8  PROT 6.0*  ALBUMIN 3.0*   No results for input(s): LIPASE, AMYLASE in the last 168 hours. No results for input(s): AMMONIA in the last 168 hours. Coagulation Profile: No results for input(s): INR, PROTIME in the last 168 hours. Cardiac Enzymes: Recent Labs  Lab 03/15/17 1320  TROPONINI 1.29*   BNP (last 3 results) Recent Labs    07/03/16 1350  PROBNP 4,153*   HbA1C: No results for input(s): HGBA1C in the last 72 hours. CBG: Recent Labs  Lab 03/18/17 1114 03/18/17 1639 03/18/17 2137 03/19/17 0812 03/19/17 1000  GLUCAP 269* 249* 270* 66 103*   Lipid Profile: No results for input(s): CHOL, HDL, LDLCALC, TRIG, CHOLHDL, LDLDIRECT in the last 72 hours. Thyroid Function Tests: No results for input(s): TSH, T4TOTAL, FREET4, T3FREE, THYROIDAB in the last 72 hours. Anemia Panel: No results for  input(s): VITAMINB12, FOLATE, FERRITIN, TIBC, IRON, RETICCTPCT in the last 72 hours. Sepsis Labs: Recent Labs  Lab 03/15/17 1352  LATICACIDVEN 1.49    Recent Results (from the past 240 hour(s))  Culture, blood (routine x 2) Call MD if unable to obtain prior to antibiotics being given     Status: None   Collection Time: 03/09/17  4:24 PM  Result Value Ref Range Status   Specimen Description BLOOD LEFT HAND  Final   Special Requests   Final    BOTTLES DRAWN AEROBIC AND ANAEROBIC Blood Culture adequate volume   Culture NO GROWTH 5 DAYS  Final   Report Status 03/14/2017 FINAL  Final  Culture, blood (routine x 2) Call MD if unable to obtain prior to antibiotics being given     Status: None   Collection Time: 03/09/17  4:24 PM  Result Value Ref Range Status   Specimen Description BLOOD RIGHT HAND  Final   Special Requests   Final    BOTTLES DRAWN AEROBIC AND ANAEROBIC Blood Culture adequate volume   Culture NO GROWTH 5 DAYS  Final   Report Status 03/14/2017 FINAL  Final  Respiratory Panel by PCR     Status: Abnormal   Collection Time: 03/09/17  4:47 PM  Result Value Ref  Range Status   Adenovirus NOT DETECTED NOT DETECTED Final   Coronavirus 229E DETECTED (A) NOT DETECTED Final   Coronavirus HKU1 NOT DETECTED NOT DETECTED Final   Coronavirus NL63 NOT DETECTED NOT DETECTED Final   Coronavirus OC43 NOT DETECTED NOT DETECTED Final   Metapneumovirus NOT DETECTED NOT DETECTED Final   Rhinovirus / Enterovirus NOT DETECTED NOT DETECTED Final   Influenza A NOT DETECTED NOT DETECTED Final   Influenza B NOT DETECTED NOT DETECTED Final   Parainfluenza Virus 1 NOT DETECTED NOT DETECTED Final   Parainfluenza Virus 2 NOT DETECTED NOT DETECTED Final   Parainfluenza Virus 3 NOT DETECTED NOT DETECTED Final   Parainfluenza Virus 4 NOT DETECTED NOT DETECTED Final   Respiratory Syncytial Virus NOT DETECTED NOT DETECTED Final   Bordetella pertussis NOT DETECTED NOT DETECTED Final   Chlamydophila  pneumoniae NOT DETECTED NOT DETECTED Final   Mycoplasma pneumoniae NOT DETECTED NOT DETECTED Final  MRSA PCR Screening     Status: None   Collection Time: 03/12/17 10:22 AM  Result Value Ref Range Status   MRSA by PCR NEGATIVE NEGATIVE Final    Comment:        The GeneXpert MRSA Assay (FDA approved for NASAL specimens only), is one component of a comprehensive MRSA colonization surveillance program. It is not intended to diagnose MRSA infection nor to guide or monitor treatment for MRSA infections.   MRSA PCR Screening     Status: None   Collection Time: 03/15/17  6:48 PM  Result Value Ref Range Status   MRSA by PCR NEGATIVE NEGATIVE Final    Comment:        The GeneXpert MRSA Assay (FDA approved for NASAL specimens only), is one component of a comprehensive MRSA colonization surveillance program. It is not intended to diagnose MRSA infection nor to guide or monitor treatment for MRSA infections.          Radiology Studies: No results found.      Scheduled Meds: . atorvastatin  40 mg Oral Daily  . donepezil  5 mg Oral Daily  . dorzolamide-timolol  1 drop Both Eyes BID  . enoxaparin (LOVENOX) injection  30 mg Subcutaneous Q24H  . escitalopram  10 mg Oral Daily  . furosemide  80 mg Intravenous Q12H  . insulin aspart  0-15 Units Subcutaneous TID AC & HS  . insulin aspart  3 Units Subcutaneous TID WC  . insulin glargine  18 Units Subcutaneous BH-q7a  . ipratropium-albuterol  3 mL Nebulization BID  . latanoprost  1 drop Both Eyes QHS  . metoprolol tartrate  5 mg Intravenous Q8H  . potassium chloride  40 mEq Oral TID  . predniSONE  50 mg Oral Q breakfast  . sodium chloride flush  3 mL Intravenous Q12H   Continuous Infusions: . sodium chloride       LOS: 4 days    Bethany Vanloan Jaynie Collins, MD [facility] Pager [phone]  If 7PM-7AM, please contact night-coverage [email or web address] Password [facility] 03/19/2017, 10:23 AM

## 2017-03-19 NOTE — Clinical Social Work Note (Signed)
Clinical Social Work Assessment  Patient Details  Name: Bethany Nelson MRN: [id number] Date of Birth: 03/11/1926  Date of referral:  03/19/17               Reason for consult:  Facility Placement                Permission sought to share information with:  Facility Medical sales representativeContact Representative, Family Supports Permission granted to share information::  No(patient not fully oriented)  Name::     Crosby OysterBarbara Sessoms  Agency::  SNFs  Relationship::  daughter  Contact Information:  [phone]  Housing/Transportation Living arrangements for the past 2 months:  Single Family Home Source of Information:  Adult Children Patient Interpreter Needed:  None Criminal Activity/Legal Involvement Pertinent to Current Situation/Hospitalization:  No - Comment as needed Significant Relationships:  Adult Children Lives with:  Self Do you feel safe going back to the place where you live?  Yes Need for family participation in patient care:  Yes (Comment)  Care giving concerns: Patient from home. Family would like SNF. Patient requiring 2 person assist.    Social Worker assessment / plan: CSW updated by palliative that patient's family would like SNF for patient. CSW called patient's daughter, [name] and discussed disposition planning. Daughter prefers [facility] and would like patient to stay there for two weeks for rehab. CSW explained insurance authorization process and informed daughter that insurance would approve for a few days at a time and selected SNF would need to submit updates. Patient has [employer] medicare and will require pre-auth to admit to facility. CSW discussed family preferences with PT. CSW to start [employer] auth and give SNF bed offers when available.  Employment status:  Retired Health and safety inspectornsurance information:  Managed Medicare([employer]) PT Recommendations:  Skilled Nursing Facility Information / Referral to community resources:  Skilled Nursing Facility  Patient/Family's Response to care: Daughter  appreciative of care.  Patient/Family's Understanding of and Emotional Response to Diagnosis, Current Treatment, and Prognosis: Daughter with understanding of patient's condition and hopeful for short term rehab.  Emotional Assessment Appearance:  Appears stated age Attitude/Demeanor/Rapport:  Unable to Assess Affect (typically observed):  Unable to Assess Orientation:  Oriented to Self, Oriented to Place Alcohol / Substance use:  Not Applicable Psych involvement (Current and /or in the community):  No (Comment)  Discharge Needs  Concerns to be addressed:  Discharge Planning Concerns, Care Coordination Readmission within the last 30 days:  No Current discharge risk:  Physical Impairment, Dependent with Mobility Barriers to Discharge:  Continued Medical Work up, Insurance Authorization   Abigail ButtsSusan Gracious Renken, LCSW 03/19/2017, 11:05 AM

## 2017-03-19 NOTE — [place] FL2 (Signed)
[place] MED[place] FL2 LEVEL OF CARE SCREENING TOOL     IDENTIFICATION  Patient Name: Bethany OhmsMargie M Huether Birthdate: 08/26/1926 Sex: female Admission Date (Current Location): 03/15/2017  [facility] [place]   Facility and Address:  [facility] [place], [address], [place], [place] [address]      Provider Number: [id number]  Attending Physician Name and Address:  Maxie BarbBhandari, Dron Prasad, MD  Relative Name and Phone Number:  Crosby OysterBarbara Sessoms, daughter, [phone]    Current Level of Care: Hospital Recommended Level of Care: Skilled Nursing Facility Prior Approval Number:    Date Approved/Denied:   PASRR Number: [id number]  Discharge Plan: SNF    Current Diagnoses: Patient Active Problem List   Diagnosis Date Noted  . Pressure injury of skin 03/16/2017  . Acute on chronic combined systolic and diastolic CHF, NYHA class 4 w/ severe pulmonary hypertension secondary to severe mitral regurg 03/15/2017  . Diabetes mellitus, insulin dependent (IDDM), uncontrolled (HCC) 03/15/2017  . Acute respiratory failure (HCC) 03/15/2017  . CKD (chronic kidney disease) stage IV 03/15/2017  . HTN, goal below 140/90 03/15/2017  . HLD (hyperlipidemia) 03/15/2017  . FTT (failure to thrive) in adult 03/15/2017  . Acute on chronic combined systolic and diastolic heart failure (HCC) 03/09/2017  . Acute respiratory failure with hypoxemia (HCC) 03/09/2017  . Severe mitral regurgitation 03/09/2017  . Moderate to severe pulmonary hypertension (HCC) 03/09/2017  . Dementia 03/09/2017  . Diabetes mellitus type 2, insulin dependent (HCC) 03/09/2017  . CKD (chronic kidney disease) stage 4, GFR 15-29 ml/min (HCC) 03/09/2017  . HCAP (healthcare-associated pneumonia) 03/09/2017  . Palliative care by specialist   . Advance care planning   . Mitral valve disorder   . Decreased urine output   . Dyspnea 02/03/2017  . Arthritis 07/16/2016  . Depression 07/16/2016  . Thyroid  disease 07/16/2016  . Elevated troponin 06/22/2016  . Acute respiratory failure with hypoxia (HCC) 06/22/2016  . 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 Chronic diastolic heart failure (HCC) 05/12/2016  . Hyperlipidemia, unspecified 05/12/2016  . Heart failure with reduced ejection fraction (HCC) 02/21/2016  . Hypertension 02/21/2016  . Type 2 diabetes mellitus with stage 3 chronic kidney disease, with long-term current use of insulin (HCC) 02/21/2016  . CKD (chronic kidney disease), stage IV (HCC) 02/21/2016  . New onset of congestive heart failure (HCC) 02/21/2016    Orientation RESPIRATION BLADDER Height & Weight     Self, Place  Normal Incontinent, External catheter Weight: 182 lb 15.7 oz (83 kg) Height:  5\' 5"  (165.1 cm)  BEHAVIORAL SYMPTOMS/MOOD NEUROLOGICAL BOWEL NUTRITION STATUS      Incontinent Diet(please see DC summary)  AMBULATORY STATUS COMMUNICATION OF NEEDS Skin   Extensive Assist Verbally PU Stage and Appropriate Care(PU stage I coccyx, foam)                       Personal Care Assistance Level of Assistance  Bathing, Feeding, Dressing Bathing Assistance: Maximum assistance Feeding assistance: Limited assistance Dressing Assistance: Maximum assistance     Functional Limitations Info  Sight, Hearing, Speech Sight Info: Adequate Hearing Info: Impaired Speech Info: Adequate    SPECIAL CARE FACTORS FREQUENCY  PT (By licensed PT), OT (By licensed OT)     PT Frequency: 5x/week OT Frequency: 5x/week  Contractures Contractures Info: Not present    Additional Factors Info  Code Status, Allergies, Psychotropic, Insulin Sliding Scale Code Status Info: DNR Allergies Info: Zithromax Azithromycin Psychotropic Info: lexapro Insulin Sliding Scale Info: insulin novolog 3x/day with meals and at bedtime, lantus in the morning        Current Medications (03/19/2017):  This is the current hospital active medication list Current Facility-Administered Medications  Medication Dose Route Frequency Provider Last Rate Last Dose  . 0.9 %  sodium chloride infusion  250 mL Intravenous PRN Russella Dar, NP      . acetaminophen (TYLENOL) tablet 650 mg  650 mg Oral Q4H PRN Russella Dar, NP      . albuterol (PROVENTIL) (2.5 MG/3ML) 0.083% nebulizer solution 2.5 mg  2.5 mg Nebulization Q4H PRN Rai, Ripudeep K, MD      . atorvastatin (LIPITOR) tablet 40 mg  40 mg Oral Daily Rai, Ripudeep K, MD   40 mg at 03/19/17 1003  . donepezil (ARICEPT) tablet 5 mg  5 mg Oral Daily Rai, Ripudeep K, MD   5 mg at 03/19/17 1003  . dorzolamide-timolol (COSOPT) 22.3-6.8 MG/ML ophthalmic solution 1 drop  1 drop Both Eyes BID Rai, Ripudeep K, MD   1 drop at 03/19/17 1005  . enoxaparin (LOVENOX) injection 30 mg  30 mg Subcutaneous Q24H Russella Dar, NP   30 mg at 03/18/17 2209  . escitalopram (LEXAPRO) tablet 10 mg  10 mg Oral Daily Rai, Ripudeep K, MD   10 mg at 03/19/17 1002  . fluticasone (FLONASE) 50 MCG/ACT nasal spray 1 spray  1 spray Each Nare Daily PRN Rai, Ripudeep K, MD   1 spray at 03/19/17 1005  . furosemide (LASIX) injection 80 mg  80 mg Intravenous Q12H Russella Dar, NP   80 mg at 03/19/17 0306  . insulin aspart (novoLOG) injection 0-15 Units  0-15 Units Subcutaneous TID AC & HS Rai, Ripudeep K, MD   8 Units at 03/18/17 2211  . insulin aspart (novoLOG) injection 3 Units  3 Units Subcutaneous TID WC Rai, Ripudeep K, MD   3 Units at 03/18/17 1644  . insulin glargine (LANTUS) injection 18 Units  18 Units Subcutaneous BH-q7a Rai, Ripudeep K, MD   18 Units at 03/18/17 0830  . ipratropium-albuterol (DUONEB) 0.5-2.5 (3) MG/3ML nebulizer solution 3 mL  3 mL Nebulization BID Rai, Ripudeep K, MD   3 mL at 03/19/17 0842  . latanoprost (XALATAN) 0.005 % ophthalmic solution 1 drop  1 drop Both Eyes QHS Rai, Ripudeep K, MD   1 drop at  03/18/17 2224  . metoprolol tartrate (LOPRESSOR) injection 5 mg  5 mg Intravenous Q8H Russella Dar, NP   5 mg at 03/19/17 0550  . ondansetron (ZOFRAN) injection 4 mg  4 mg Intravenous Q6H PRN Russella Dar, NP      . potassium chloride 20 MEQ/15ML (10%) solution 40 mEq  40 mEq Oral TID Maxie Barb, MD      . predniSONE (DELTASONE) tablet 50 mg  50 mg Oral Q breakfast Maxie Barb, MD   50 mg at 03/19/17 1002  . sodium chloride flush (NS) 0.9 % injection 3 mL  3 mL Intravenous Q12H Russella Dar, NP   3 mL at 03/18/17 2224  . sodium chloride flush (NS) 0.9 % injection 3 mL  3 mL Intravenous PRN Russella Dar, NP         Discharge Medications: Please see discharge summary for  a list of discharge medications.  Relevant Imaging Results:  Relevant Lab Results:   Additional Information SSN: [id number]  Abigail Butts, LCSW

## 2017-03-19 NOTE — Care Management Important Message (Signed)
Important Message  Patient Details  Name: Bethany OhmsMargie M Balling MRN: [id number] Date of Birth: 06/20/1926   Medicare Important Message Given:  Yes    Kyla BalzarineShealy, Elmor Kost Abena 03/19/2017, 12:58 PM

## 2017-03-19 NOTE — Consult Note (Signed)
Consultation Note Date: 03/19/2017   Patient Name: Bethany Nelson  DOB: 02/02/1927  MRN: [id number]  Age / Sex: 84 y.o., female  PCP: Jerl Mina, MD Referring Physician: Maxie Barb, MD  Reason for Consultation: Establishing goals of care and Psychosocial/spiritual support  HPI/Patient Profile: 84 y.o. female   admitted on 03/15/2017 with a past  medical history significant for diabetes on insulin, dementia, stage IV chronic kidney disease, new diagnosis November 2018 of chronic systolic and diastolic heart failure in the context of severe mitral regurgitation.  During that initial diagnosis cardiology was consulted and determined the patient was not a surgical candidate for repair of the mitral valve.    Patient was readmitted to the hospital on 12/31 for dyspnea without hypoxemia and elevated BNP.  Chest x-ray revealed a left lower lobe pneumonia noting patient had oral Augmentin prior to admission.     Because of the severity of her symptoms cardiology was consulted and determined the patient was hospice appropriate given her underlying end-stage heart failure and an operable valvular heart disease.    Palliative medicine was consulted.  Patient was made DNR status but family was not ready to progress to comfort measures.  Apparently family declined home health hospice and the patient was discharged home yesterday on 1/5.    Readmitted with  significant dyspnea shortness of breath with audible wheezing.    Family once again face advanced directive decisions, EOL decisions and anticipatory care needs.   Clinical Assessment and Goals of Care:  This NP Lorinda Creed reviewed medical records, received report from team, assessed the patient and then meet at the patient's bedside along with her daughter and GrandD by phone  to discuss diagnosis, prognosis, GOC, EOL wishes disposition and  options.  Concept of Hospice ( educated on myths) and Palliative Care were discussed  A detailed discussion was had today regarding advanced directives.  Concepts specific to code status, artifical feeding and hydration, continued IV antibiotics and rehospitalization was had.  The difference between a aggressive medical intervention path  and a palliative comfort care path for this patient at this time was had.  Values and goals of care important to patient and family were attempted to be elicited.  MOST form introduced  Natural trajectory and expectations at EOL were discussed.  Questions and concerns addressed.   Family encouraged to call with questions or concerns.    PMT will continue to support holistically.  Although the daughter/main decision maker engaged with me in discussion of the seriousness of her mother's illness it was difficult for her.  The grand- daughter stated "my mother has a hard time with death".  Mother was tearful throughout the meeting.   Emotional support offered Hard Choices booklet left for review      SUMMARY OF RECOMMENDATIONS    Code Status/Advance Care Planning:  DNR    Symptom Management:   Discussed use of opioids for management of dyspnea  Palliative Prophylaxis:   Aspiration, Bowel Regimen, Delirium Protocol and Oral Care  Additional Recommendations (Limitations, Scope, Preferences):  Full scope of treatment   Daughter will need ongoing support and education helping her understand the limitation of medical interventions to prolong quality of life in this situation   Psycho-social/Spiritual:   Desire for further Chaplaincy support:yes  Additional Recommendations: Education on Hospice  Prognosis:   < 6 months  Discharge Planning: family is now considering SNF for rehab if patient is eligible, daughter is interested in [facility]/SNF     Primary Diagnoses: Present on Admission: . Acute on chronic combined systolic and diastolic CHF,  NYHA class 4 w/ severe pulmonary hypertension secondary to severe mitral regurg . Diabetes mellitus, insulin dependent (IDDM), uncontrolled (HCC) . Acute respiratory failure (HCC) . CKD (chronic kidney disease) stage IV . HTN, goal below 140/90 . HLD (hyperlipidemia) . FTT (failure to thrive) in adult   I have reviewed the medical record, interviewed the patient and family, and examined the patient. The following aspects are pertinent.  Past Medical History:  Diagnosis Date  . Acute respiratory failure (HCC) 03/16/2017  . Chronic diastolic congestive heart failure (HCC)    a. 02/2016: echo showing normal EF, Grade 3 DD, mild MR, trivial TR  . CKD (chronic kidney disease), stage IV (HCC)   . Dementia   . Diabetes mellitus without complication (HCC)   . Hypertension   . Pneumonia 03/10/2017   Social History   Socioeconomic History  . Marital status: Widowed    Spouse name: None  . Number of children: None  . Years of education: None  . Highest education level: None  Social Needs  . Financial resource strain: None  . Food insecurity - worry: None  . Food insecurity - inability: None  . Transportation needs - medical: None  . Transportation needs - non-medical: None  Occupational History  . None  Tobacco Use  . Smoking status: Never Smoker  . Smokeless tobacco: Never Used  Substance and Sexual Activity  . Alcohol use: No  . Drug use: No  . Sexual activity: None  Other Topics Concern  . None  Social History Narrative  . None   Family History  Problem Relation Age of Onset  . Esophageal cancer Mother   . Heart disease Neg Hx    Scheduled Meds: . atorvastatin  40 mg Oral Daily  . donepezil  5 mg Oral Daily  . dorzolamide-timolol  1 drop Both Eyes BID  . enoxaparin (LOVENOX) injection  30 mg Subcutaneous Q24H  . escitalopram  10 mg Oral Daily  . furosemide  80 mg Intravenous Q12H  . insulin aspart  0-15 Units Subcutaneous TID AC & HS  . insulin aspart  3 Units  Subcutaneous TID WC  . insulin glargine  18 Units Subcutaneous BH-q7a  . ipratropium-albuterol  3 mL Nebulization BID  . latanoprost  1 drop Both Eyes QHS  . metoprolol tartrate  5 mg Intravenous Q8H  . potassium chloride  40 mEq Oral TID  . predniSONE  50 mg Oral Q breakfast  . sodium chloride flush  3 mL Intravenous Q12H   Continuous Infusions: . sodium chloride     PRN Meds:.sodium chloride, acetaminophen, albuterol, fluticasone, ondansetron (ZOFRAN) IV, sodium chloride flush Medications Prior to Admission:  Prior to Admission medications   Medication Sig Start Date End Date Taking? Authorizing Provider  acetaminophen (TYLENOL) 500 MG tablet Take 1,000 mg by mouth every 6 (six) hours as needed for mild pain.   Yes [provider]  albuterol (PROVENTIL HFA) 108 (90  Base) MCG/ACT inhaler Inhale 1 puff into the lungs every 4 (four) hours as needed for wheezing. 08/18/16 08/18/17 Yes [provider]  aspirin EC 81 MG tablet Take 81 mg by mouth every other day.   Yes [provider]  atorvastatin (LIPITOR) 40 MG tablet Take 1 tablet (40 mg total) by mouth daily. 02/06/17  Yes Helberg, Jill Alexanders, MD  benzonatate (TESSALON) 100 MG capsule Take 100 mg by mouth 3 (three) times daily as needed for cough. 03/07/17  Yes [provider]  Biotin 1000 MCG CHEW Chew 1,000 mcg by mouth daily.    Yes [provider]  cetirizine (ZYRTEC) 10 MG tablet Take 10 mg by mouth daily as needed for allergies.   Yes [provider]  cromolyn (OPTICROM) 4 % ophthalmic solution Place 1 drop into both eyes 4 (four) times daily.   Yes [provider]  donepezil (ARICEPT) 5 MG tablet Take 5 mg by mouth daily. 05/13/16  Yes [provider]  dorzolamide-timolol (COSOPT) 22.3-6.8 MG/ML ophthalmic solution Place 1 drop into both eyes 2 (two) times daily. 12/16/15  Yes [provider]  escitalopram (LEXAPRO) 10 MG tablet Take 10 mg by mouth daily.    Yes  [provider]  fluticasone (FLONASE) 50 MCG/ACT nasal spray Place 1 spray into both nostrils daily as needed for allergies.  02/17/16  Yes [provider]  furosemide (LASIX) 80 MG tablet Take 1 tablet (80 mg total) by mouth 2 (two) times daily. 02/06/17  Yes Helberg, Jill Alexanders, MD  ipratropium-albuterol (DUONEB) 0.5-2.5 (3) MG/3ML SOLN Take 3 mLs by nebulization every 4 (four) hours as needed for wheezing. 01/31/17  Yes [provider]  LANTUS SOLOSTAR 100 UNIT/ML Solostar Pen Inject 21 Units into the skin every morning. Patient taking differently: Inject 10-16 Units into the skin See admin instructions. Inject 10-16 units subcutaneously if blood sugar is 90 or more:  CBG 90's inject 10 units, 115-125 14 units, >125 16 units 03/14/17  Yes Jerald Kief, MD  latanoprost (XALATAN) 0.005 % ophthalmic solution Place 1 drop into both eyes at bedtime. 02/15/16  Yes [provider]  metoprolol tartrate (LOPRESSOR) 25 MG tablet Take 12.5 mg by mouth 2 (two) times daily. 02/24/17  Yes [provider]  pioglitazone (ACTOS) 15 MG tablet Take 15 mg by mouth daily.  07/21/16  Yes [provider]  potassium chloride SA (K-DUR,KLOR-CON) 20 MEQ tablet Take 20 mEq by mouth 2 (two) times daily.   Yes [provider]  predniSONE (DELTASONE) 10 MG tablet Take 10-60 mg by mouth See admin instructions. Tapered course started 03/15/17: take 1 tablet (10 mg) by mouth 6 times on day 1-3, then take 1 tablet (10 mg) 4 times on days 4-6, then take 1 tablet (10 mg) 2 times on days 7-9, then take 1 tablet (10 mg) daily on days 10-12, then take 1/2 tablet (5 mg) on days 13-15, then stop   Yes [provider]  predniSONE (DELTASONE) 5 MG tablet Take 1 tablet (5 mg total) by mouth daily with breakfast. Patient not taking: Reported on 03/15/2017 03/14/17   Jerald Kief, MD   Allergies  Allergen Reactions  . Zithromax [Azithromycin] Other (See Comments)    Noted red  enhancement of vein distal to IV insertion site after several minutes of IV drug infusion- perfect outline of vein branch- no itching or edema   Review of Systems  Unable to perform ROS: Dementia    Physical Exam  Constitutional:  She appears well-developed.  Cardiovascular: Bradycardia present.  Pulmonary/Chest: Effort normal and breath sounds normal.  Skin: Skin is warm and dry.    Vital Signs: BP 103/85 (BP Location: Right Wrist)   Pulse (!) 59   Temp 97.8 F (36.6 C) (Oral)   Resp 18   Ht 5\' 5"  (1.651 m)   Wt 83 kg (182 lb 15.7 oz)   SpO2 99%   BMI 30.45 kg/m  Pain Assessment: 0-10   Pain Score: 0-No pain   SpO2: SpO2: 99 % O2 Device:SpO2: 99 % O2 Flow Rate: .O2 Flow Rate (L/min): 1 L/min  IO: Intake/output summary:   Intake/Output Summary (Last 24 hours) at 03/19/2017 1503 Last data filed at 03/19/2017 1252 Gross per 24 hour  Intake 360 ml  Output 800 ml  Net -440 ml    LBM: Last BM Date: 03/18/17 Baseline Weight: Weight: 82.6 kg (182 lb) Most recent weight: Weight: 83 kg (182 lb 15.7 oz)     Palliative Assessment/Data: 30 % at best   Discussed with Dr. Ronalee Belts  Time In: 1630 Time Out: 1745 Time Total: 75 minutes Greater than 50%  of this time was spent counseling and coordinating care related to the above assessment and plan.  Signed by: Lorinda Creed, NP   Please contact Palliative Medicine Team phone at [phone] for questions and concerns.  For individual provider: See Loretha Stapler

## 2017-03-19 NOTE — Evaluation (Signed)
Occupational Therapy Evaluation Patient Details Name: Bethany Nelson MRN: [id number] DOB: [birth year] Today's Date: 03/19/2017    History of Present Illness Pt is an 84 y/o female with a PMH significant for DM, dementia, CKD IV, chronic systolic and diastolic heart failure in the context of severe mitral regurgitation (not a surgical candidate). Multiple recent admissions, the latest d/c was 1/5. She returned back to the ED 1 day later with dyspnea, and chest x-ray suggested pulmonary edema.    Clinical Impression   Per pts aide, she required some assist with ADL PTA. Currently pt overall min-mod assist for stand pivot transfers, min-mod assist for UB ADL, and max assist for LB ADL. Recommending SNF for follow up to maximize independence and safety with ADL and functional mobility prior to return home. Pt would benefit from continued skilled OT to address established goals.    Follow Up Recommendations  SNF;Supervision/Assistance - 24 hour    Equipment Recommendations  3 in 1 bedside commode    Recommendations for Other Services       Precautions / Restrictions Precautions Precautions: Fall Restrictions Weight Bearing Restrictions: No      Mobility Bed Mobility Overal bed mobility: Needs Assistance Bed Mobility: Supine to Sit;Sit to Supine     Supine to sit: Min guard Sit to supine: Min assist   General bed mobility comments: Increased time and effort. Min assist for LEs back to bed  Transfers Overall transfer level: Needs assistance Equipment used: Rolling walker (2 wheeled) Transfers: Sit to/from [employer] Sit to Stand: Min assist;Mod assist(Mod assist from bed, min from [facility]) Stand pivot transfers: Min assist;Mod assist(Mod EOB>BSC, Min BSC>EOB)       General transfer comment: Assist to boost up and for balance in standing, assist for positioning of RW during transfer    Balance Overall balance assessment: Needs assistance Sitting-balance  support: Feet supported;No upper extremity supported Sitting balance-Leahy Scale: Fair     Standing balance support: Bilateral upper extremity supported Standing balance-Leahy Scale: Poor Standing balance comment: RW for support                           ADL either performed or assessed with clinical judgement   ADL Overall ADL's : Needs assistance/impaired Eating/Feeding: Set up   Grooming: Supervision/safety;Sitting   Upper Body Bathing: Moderate assistance;Sitting   Lower Body Bathing: Maximal assistance;Sit to/from stand   Upper Body Dressing : Minimal assistance;Sitting   Lower Body Dressing: Maximal assistance;Sit to/from stand   Toilet Transfer: Moderate assistance;Minimal assistance;Stand-pivot;BSC;RW Toilet Transfer Details (indicate cue type and reason): Mod for stand pivot EOB to BSC, min assist for return to bed Toileting- Clothing Manipulation and Hygiene: Total assistance;Sit to/from stand Toileting - Clothing Manipulation Details (indicate cue type and reason): for peri care following BM     Functional mobility during ADLs: Minimal assistance;Moderate assistance;Rolling walker(for stand pivot only)       Vision         Perception     Praxis      Pertinent Vitals/Pain Pain Assessment: Faces Faces Pain Scale: Hurts little more Pain Location: bottom during BM Pain Descriptors / Indicators: Grimacing Pain Intervention(s): Monitored during session     Hand Dominance Right   Extremity/Trunk Assessment Upper Extremity Assessment Upper Extremity Assessment: Generalized weakness   Lower Extremity Assessment Lower Extremity Assessment: Defer to PT evaluation   Cervical / Trunk Assessment Cervical / Trunk Assessment: Kyphotic   Communication Communication Communication: [facility]  Cognition Arousal/Alertness: Awake/alert Behavior During Therapy: Flat affect Overall Cognitive Status: History of cognitive impairments - at baseline                                      General Comments  Home aide present during session    Exercises    Shoulder Instructions      Home Living Family/patient expects to be discharged to:: Private residence Living Arrangements: Children;Other relatives Available Help at Discharge: Family;Available 24 hours/day;Personal care attendant Type of Home: House Home Access: Stairs to enter Entrance Stairs-Number of Steps: 1   Home Layout: Two level;Able to live on main level with bedroom/bathroom     Bathroom Shower/Tub: Producer, television/film/videoWalk-in shower   Bathroom Toilet: Handicapped height     Home Equipment: Emergency planning/management officerhower seat;Walker - 4 wheels   Additional Comments: PCA 5 days/week from 8-3      Prior Functioning/Environment Level of Independence: Needs assistance  Gait / Transfers Assistance Needed: uses a rollator to ambulate ADL's / Homemaking Assistance Needed: needs assist bathing, can dress herself with occasional assist from her daughter            OT Problem List: Decreased strength;Decreased activity tolerance;Impaired balance (sitting and/or standing);Decreased cognition;Decreased safety awareness;Decreased knowledge of use of DME or AE;Pain      OT Treatment/Interventions: Self-care/ADL training;Energy conservation;DME and/or AE instruction;Therapeutic activities;Patient/family education;Balance training    OT Goals(Current goals can be found in the care plan section) Acute Rehab OT Goals Patient Stated Goal: none stated OT Goal Formulation: With patient Time For Goal Achievement: 04/02/17 Potential to Achieve Goals: Good ADL Goals Pt Will Perform Grooming: with min guard assist;standing Pt Will Perform Upper Body Dressing: with supervision;sitting Pt Will Perform Lower Body Dressing: with min assist;sit to/from stand Pt Will Transfer to Toilet: with min assist;ambulating;bedside commode Pt Will Perform Toileting - Clothing Manipulation and hygiene: with min assist;sit to/from  stand  OT Frequency: Min 2X/week   Barriers to D/C:            Co-evaluation              AM-PAC PT "6 Clicks" Daily Activity     Outcome Measure Help from another person eating meals?: None Help from another person taking care of personal grooming?: A Little Help from another person toileting, which includes using toliet, bedpan, or urinal?: A Lot Help from another person bathing (including washing, rinsing, drying)?: A Lot Help from another person to put on and taking off regular upper body clothing?: A Little Help from another person to put on and taking off regular lower body clothing?: A Lot 6 Click Score: 16   End of Session Equipment Utilized During Treatment: Rolling walker Nurse Communication: Mobility status;Other (comment)(pt had BM on BSC)  Activity Tolerance: Patient tolerated treatment well Patient left: in bed;with call bell/phone within reach;with bed alarm set;with family/visitor present  OT Visit Diagnosis: Unsteadiness on feet (R26.81)                Time: 1351-1411 OT Time Calculation (min): 20 min Charges:  OT General Charges $OT Visit: 1 Visit OT Evaluation $OT Eval Moderate Complexity: 1 Mod G-Codes:     Norvil Martensen A. Brett Albinooffey, M.S., OTR/L Pager: [phone]  Gaye AlkenBailey A Jezebel Pollet 03/19/2017, 3:02 PM

## 2017-03-19 NOTE — Progress Notes (Signed)
Physical Therapy Treatment Patient Details Name: Bethany Nelson MRN: [id number] DOB: 03/28/1926 Today's Date: 03/19/2017    History of Present Illness Pt is an 84 y/o female with a PMH significant for DM, dementia, CKD IV, chronic systolic and diastolic heart failure in the context of severe mitral regurgitation (not a surgical candidate). Multiple recent admissions, the latest d/c was 1/5. She returned back to the ED 1 day later with dyspnea, and chest x-ray suggested pulmonary edema.     PT Comments    Pt admitted with above diagnosis. Pt currently with functional limitations due to balance and endurance deficits. Pt was able to ambulate with RW with mod assist of 1 person with 2nd person following with chair.  Will benefit from SNF to incr mobility and strength as pt currently will need 24 hour care/physical assist.  Updated d/c plan below.  Will continue acute PT.  Pt will benefit from skilled PT to increase their independence and safety with mobility to allow discharge to the venue listed below.     Follow Up Recommendations  Supervision/Assistance - 24 hour;SNF     Equipment Recommendations  None recommended by PT    Recommendations for Other Services       Precautions / Restrictions Precautions Precautions: Fall Restrictions Weight Bearing Restrictions: No    Mobility  Bed Mobility Overal bed mobility: Needs Assistance Bed Mobility: Supine to Sit     Supine to sit: +2 for physical assistance;Mod assist     General bed mobility comments: Assist for transition to EOB. Hand-over-hand assist to reach for bed rail for support, and bed pad used to assist in rotating hips around to EOB.   Transfers Overall transfer level: Needs assistance Equipment used: Rolling walker (2 wheeled) Transfers: Sit to/from Stand Sit to Stand: Mod assist;+2 physical assistance         General transfer comment: +2 assist for power-up to full stand. Incr time was required to initiate  movement. Incr time to come to full stand.  Needed assist for slight posterior lean.   Ambulation/Gait Ambulation/Gait assistance: Min assist;+2 safety/equipment Ambulation Distance (Feet): 30 Feet(15 feet x 2) Assistive device: Rolling walker (2 wheeled) Gait Pattern/deviations: Step-through pattern;Decreased step length - left;Decreased stance time - right;Decreased dorsiflexion - right;Decreased dorsiflexion - left;Drifts right/left;Trunk flexed   Gait velocity interpretation: Below normal speed for age/gender General Gait Details: gait distance limited by fatigue, chair follow.  Noted BM smell and upon checking pt was having a BM therefore walked to bathroom.  Pt constipated and possibly impacted as she went to bathroom but there was a lot more when PT wiped pt.  Called nurse to check as question whether pt needs suppository as she has some bleeding as well due to firm stool.  Pt was cleaned appropriately by PT and nurse contacting MD about suppository.  Left in chair with caregiver in room and chair alarm set.     Stairs            Wheelchair Mobility    Modified Rankin (Stroke Patients Only)       Balance Overall balance assessment: Needs assistance Sitting-balance support: Bilateral upper extremity supported;Feet supported Sitting balance-Leahy Scale: Fair     Standing balance support: Bilateral upper extremity supported;During functional activity Standing balance-Leahy Scale: Poor Standing balance comment: reliant on UE support, very limited by fatigue  Cognition Arousal/Alertness: Awake/alert Behavior During Therapy: Flat affect Overall Cognitive Status: History of cognitive impairments - at baseline                                        Exercises General Exercises - Lower Extremity Ankle Circles/Pumps: AROM;Both;5 reps;Supine Long Arc Quad: AROM;Both;10 reps;Seated    General Comments General comments  (skin integrity, edema, etc.): Home aide present during session      Pertinent Vitals/Pain Pain Assessment: No/denies pain  VSS  Home Living                      Prior Function            PT Goals (current goals can now be found in the care plan section) Acute Rehab PT Goals Patient Stated Goal: to go home  Progress towards PT goals: Progressing toward goals    Frequency    Min 3X/week      PT Plan Discharge plan needs to be updated    Co-evaluation              AM-PAC PT "6 Clicks" Daily Activity  Outcome Measure  Difficulty turning over in bed (including adjusting bedclothes, sheets and blankets)?: Unable Difficulty moving from lying on back to sitting on the side of the bed? : Unable Difficulty sitting down on and standing up from a chair with arms (e.g., wheelchair, bedside commode, etc,.)?: A Lot Help needed moving to and from a bed to chair (including a wheelchair)?: A Lot Help needed walking in hospital room?: A Lot Help needed climbing 3-5 steps with a railing? : Total 6 Click Score: 9    End of Session Equipment Utilized During Treatment: Gait belt Activity Tolerance: Patient limited by fatigue Patient left: in chair;with chair alarm set;with call bell/phone within reach;with family/visitor present Nurse Communication: Mobility status PT Visit Diagnosis: Unsteadiness on feet (R26.81);Adult, failure to thrive (R62.7)     Time: 1610-9604 PT Time Calculation (min) (ACUTE ONLY): 30 min  Charges:  $Gait Training: 23-37 mins                    G Codes:       Mida Cory,PT Acute Rehabilitation [phone] (pager)    Berline Lopes 03/19/2017, 1:14 PM

## 2017-03-19 NOTE — Progress Notes (Addendum)
Inpatient Diabetes Program Recommendations  AACE/ADA: New Consensus Statement on Inpatient Glycemic Control (2015)  Target Ranges:  Prepandial:   less than 140 mg/dL      Peak postprandial:   less than 180 mg/dL (1-2 hours)      Critically ill patients:  140 - 180 mg/dL   Lab Results  Component Value Date   GLUCAP 66 03/19/2017   HGBA1C 8.5 (H) 04/08/2011   Review of Glycemic Control  Diabetes history: DM 2 Outpatient Diabetes medications: Lantus 10-16 units if glucose is >90 mg/dl, Actos 15 mg Daily Current orders for Inpatient glycemic control: Lantus 18 units, Novolog Moderate Correction 0-15 units tid + Novolog 3 units tid meal coverage  Inpatient Diabetes Program Recommendations:    Steroids being transitioned from IV solumedrol to PO prednisone today. Lantus increased to 18 units yesterday. Fasting glucose 66 mg/dl. Please consider decreasing Lantus back down to 16 units. May need to increase meal coverage if postprandial glucose continues to be elevated.  Please note patient is on Actos at home which increases CHF exacerbations and risk. Please discontinue Actos upon discharge.  Thanks,  Christena DeemShannon Ancelmo Hunt RN, MSN, [facility] Inpatient Diabetes Coordinator Team Pager [phone] (8a-5p)

## 2017-03-19 NOTE — Progress Notes (Signed)
Patient ID: Bethany Nelson, female   DOB: 06/03/1926, 84 y.o.   MRN: [id number]  This NP visited patient at the bedside as a follow up for palliative needs and emotional support.     Patient is weak, caregiver at bedside.  Placed call to daughter for f/u for palliative needs and emotional support.    Decision if for discharge home and family is not interested in hospice services at this time.  Education offered on hospice benefit and how to make self referral.  Discussed natural trajectory and expectations  in an overall failure  to thrive situation.    Questions and concerns addressed  Discussed with daughter the importance of continued conversation with self, family and   medical providers regarding overall plan of care and treatment options,  ensuring decisions are within the context of the patient's values and GOCs.  Hard Choices booklet left for review  Time in 1200          Time out  1220   Total time spent on the unit was 20 minutes  Greater than 50% of the time was spent in counseling and coordination of care  Lorinda CreedMary Davien Malone NP  Palliative Medicine Team Team Phone # [phone] Pager [phone]

## 2017-03-20 LAB — BASIC METABOLIC PANEL
Anion gap: 8 (ref 5–15)
BUN: 126 mg/dL — AB (ref 6–20)
CHLORIDE: 101 mmol/L (ref 101–111)
CO2: 32 mmol/L (ref 22–32)
Calcium: 8.3 mg/dL — ABNORMAL LOW (ref 8.9–10.3)
Creatinine, Ser: 2.35 mg/dL — ABNORMAL HIGH (ref 0.44–1.00)
GFR calc Af Amer: 20 mL/min — ABNORMAL LOW (ref >60)
GFR calc non Af Amer: 17 mL/min — ABNORMAL LOW (ref >60)
Glucose, Bld: 188 mg/dL — ABNORMAL HIGH (ref 65–99)
POTASSIUM: 4 mmol/L (ref 3.5–5.1)
SODIUM: 141 mmol/L (ref 135–145)

## 2017-03-20 LAB — GLUCOSE, CAPILLARY
GLUCOSE-CAPILLARY: 147 mg/dL — AB (ref 65–99)
GLUCOSE-CAPILLARY: 183 mg/dL — AB (ref 65–99)
GLUCOSE-CAPILLARY: 323 mg/dL — AB (ref 65–99)
Glucose-Capillary: 230 mg/dL — ABNORMAL HIGH (ref 65–99)

## 2017-03-20 NOTE — Progress Notes (Signed)
Preferred facility for patient, [facility], accepted referral. [facility]; awaiting determination. CSW to follow and support with discharge.  Abigail ButtsSusan Arthuro Canelo, LCSWA [phone]

## 2017-03-20 NOTE — Progress Notes (Signed)
PROGRESS NOTE    Bethany OhmsMargie M Nelson  ZOX:[id number] DOB: 12/10/1926 DOA: 03/15/2017 PCP: Bethany Nelson, James, MD   Brief Narrative: [age] y.o.femalewith medical history significant fordiabetes on insulin, dementia, stage IV chronic kidney disease, new diagnosis November 2018 of chronic systolic and diastolic heart failure in the context of severe mitral regurgitation. During that initial diagnosis cardiology was consulted and determined the patient was not a surgical candidate for repair of the mitral valve.  Patient was readmitted on 12/31 for pneumonia, respiratory failure, cardiology was consulted and determined patient was hospice appropriate given her underlying end-stage CHF and inoperable severe MR.  Family at that time declined home health hospice, patient was just discharged on 1/5.  Patient return back to ED with significant dyspnea, audible wheezing, no significant improvement with albuterol nebs and prednisone at home.  Chest x-ray showed interstitial infiltrates increased consistent with pulmonary edema.  Patient was placed on BiPAP.   Assessment & Plan:   #Acute on chronic combined systolic and diastolic congestive heart failure, with severe pulmonary hypertension secondary to severe mitral regurgitation/elevated troponin: -EF 25-30% with diffuse hypokinesis, grade 3 diastolic dysfunction.  Seen by cardiologist and previous admission recommended hospice care.   -Patient is clinically improving with Lasix IV.  Renal function is improving as well.  Plan to continue IV Lasix and monitor lab.  Palliative care evaluation ongoing.  PT OT evaluation.  Social worker consult for skilled nursing home discharge planning.  Waiting for bed.  #Acute respiratory failure with hypoxia: Improved.  Patient is on room air.  Completed short course of prednisone.  Continue bronchodilators.  Oxygen if needed.    #Insulin-dependent diabetes, uncontrolled due to hyperglycemia: Continue current insulin regimen.  Off  steroid.  Monitor blood sugar level.  #Chronic kidney disease stage IV: Serum creatinine level improving.  Monitor BMP.  Avoid nephrotoxins  #Hypokalemia in the setting of diuretics.  Potassium level acceptable.  Magnesium level acceptable.  On daily potassium chloride.  #Essential hypertension: Monitor blood pressure.  Continue current medication.  #Failure to thrive: PT OT evaluation.  Palliative care  #Pressure injury of his skin documented as ecchymosis on arms: Continue supportive care.  #Hyperlipidemia: Continue statin.  #Dementia without behavioral disturbance, likely age related: Continue Aricept and supportive care.  #Coughing spells today rule out aspiration: Improved.  She had seen by swallow team.  DVT prophylaxis: Lovenox subcutaneous Code Status: DNR Family Communication: Called patient's daughter and discussed with her in detail on 1/10 Disposition Plan: Likely discharge to skilled nursing home in 1-2 days when bed is available.   Consultants:   Cardiology  Procedures: None Antimicrobials: None  Subjective: Seen and examined at bedside.  Shortness of breath is gradually improving.  Cough is getting better.  No chest pain.  Caregiver at bedside. Objective: Vitals:   03/20/17 0600 03/20/17 0745 03/20/17 0930 03/20/17 1134  BP:    (!) 122/49  Pulse:  (!) 59  (!) 54  Resp:  19  13  Temp:  98.3 F (36.8 C)    TempSrc:  Oral  Oral  SpO2:   93% 98%  Weight: 80.2 kg (176 lb 12.9 oz)     Height:        Intake/Output Summary (Last 24 hours) at 03/20/2017 1416 Last data filed at 03/20/2017 0700 Gross per 24 hour  Intake 360 ml  Output 1600 ml  Net -1240 ml   Filed Weights   03/18/17 0419 03/19/17 0435 03/20/17 0600  Weight: 83.4 kg (183 lb 13.8 oz) 83  kg (182 lb 15.7 oz) 80.2 kg (176 lb 12.9 oz)    Examination:  General exam: Elderly female, not in distress Respiratory system: Bibasilar decreased breath sound, rhonchi is better. Cardiovascular system:  Regular rate rhythm S1-S2 normal.  No pedal edema. Gastrointestinal system: Abdomen is nondistended, soft and nontender. Normal bowel sounds heard. Central nervous system: Alert awake along simple commands. Skin: No rashes, lesions or ulcers Psychiatry:Mood & affect appropriate.     Data Reviewed: I have personally reviewed following labs and imaging studies  CBC: Recent Labs  Lab 03/15/17 1320  WBC 10.7*  NEUTROABS 9.2*  HGB 9.9*  HCT 32.9*  MCV 98.8  PLT 142*   Basic Metabolic Panel: Recent Labs  Lab 03/16/17 0425 03/17/17 0535 03/18/17 0347 03/19/17 0833 03/20/17 0250  NA 143 140 140 143 141  K 3.5 3.7 3.5 3.0* 4.0  CL 105 101 101 101 101  CO2 28 23 29 31  32  GLUCOSE 182* 194* 209* 87 188*  BUN 120* 126* 132* 133* 126*  CREATININE 2.82* 2.66* 2.68* 2.54* 2.35*  CALCIUM 8.7* 8.5* 8.5* 8.8* 8.3*  MG  --   --   --  2.6*  --    GFR: Estimated Creatinine Clearance: 17.3 mL/min (A) (by C-G formula based on SCr of 2.35 mg/dL (H)). Liver Function Tests: Recent Labs  Lab 03/15/17 1320  AST 23  ALT 18  ALKPHOS 40  BILITOT 0.8  PROT 6.0*  ALBUMIN 3.0*   No results for input(s): LIPASE, AMYLASE in the last 168 hours. No results for input(s): AMMONIA in the last 168 hours. Coagulation Profile: No results for input(s): INR, PROTIME in the last 168 hours. Cardiac Enzymes: Recent Labs  Lab 03/15/17 1320  TROPONINI 1.29*   BNP (last 3 results) Recent Labs    07/03/16 1350  PROBNP 4,153*   HbA1C: No results for input(s): HGBA1C in the last 72 hours. CBG: Recent Labs  Lab 03/19/17 1207 03/19/17 1639 03/19/17 2214 03/20/17 0743 03/20/17 1138  GLUCAP 125* 198* 221* 147* 183*   Lipid Profile: No results for input(s): CHOL, HDL, LDLCALC, TRIG, CHOLHDL, LDLDIRECT in the last 72 hours. Thyroid Function Tests: No results for input(s): TSH, T4TOTAL, FREET4, T3FREE, THYROIDAB in the last 72 hours. Anemia Panel: No results for input(s): VITAMINB12, FOLATE,  FERRITIN, TIBC, IRON, RETICCTPCT in the last 72 hours. Sepsis Labs: Recent Labs  Lab 03/15/17 1352  LATICACIDVEN 1.49    Recent Results (from the past 240 hour(s))  MRSA PCR Screening     Status: None   Collection Time: 03/12/17 10:22 AM  Result Value Ref Range Status   MRSA by PCR NEGATIVE NEGATIVE Final    Comment:        The GeneXpert MRSA Assay (FDA approved for NASAL specimens only), is one component of a comprehensive MRSA colonization surveillance program. It is not intended to diagnose MRSA infection nor to guide or monitor treatment for MRSA infections.   MRSA PCR Screening     Status: None   Collection Time: 03/15/17  6:48 PM  Result Value Ref Range Status   MRSA by PCR NEGATIVE NEGATIVE Final    Comment:        The GeneXpert MRSA Assay (FDA approved for NASAL specimens only), is one component of a comprehensive MRSA colonization surveillance program. It is not intended to diagnose MRSA infection nor to guide or monitor treatment for MRSA infections.          Radiology Studies: No results found.  Scheduled Meds: . atorvastatin  40 mg Oral Daily  . donepezil  5 mg Oral Daily  . dorzolamide-timolol  1 drop Both Eyes BID  . enoxaparin (LOVENOX) injection  30 mg Subcutaneous Q24H  . escitalopram  10 mg Oral Daily  . furosemide  80 mg Intravenous Q12H  . insulin aspart  0-15 Units Subcutaneous TID AC & HS  . insulin aspart  3 Units Subcutaneous TID WC  . insulin glargine  18 Units Subcutaneous BH-q7a  . ipratropium-albuterol  3 mL Nebulization BID  . latanoprost  1 drop Both Eyes QHS  . metoprolol tartrate  5 mg Intravenous Q8H  . potassium chloride  40 mEq Oral TID  . sodium chloride flush  3 mL Intravenous Q12H   Continuous Infusions: . sodium chloride       LOS: 5 days    Daneesha Quinteros Jaynie Collins, MD [facility] Pager [phone]  If 7PM-7AM, please contact night-coverage [email or web address] Password [email or web address] 03/20/2017, 2:16 PM

## 2017-03-20 NOTE — Care Management Note (Addendum)
Case Management Note  Patient Details  Name: Bethany Nelson MRN: [id number] Date of Birth: 09/27/1926  Subjective/Objective: Pt presented for Acute on Chronic CHF. Initiated on IV Lasix. MD feels that pt may be stable for SNF [facility] 03-21-17. CSW is assisting with disposition needs.                  Action/Plan: Question if needs Palliative Services at SNF. CM will continue to monitor for additional needs.   Expected Discharge Date:                  Expected Discharge Plan:  Skilled Nursing Facility  In-House Referral:  Clinical Social Work  Discharge planning Services  CM Consult  Post Acute Care Choice:  NA Choice offered to:  NA  DME Arranged:  N/A DME Agency:  NA  HH Arranged:  NA HH Agency:  NA  Status of Service:  Completed, signed off  If discussed at Long Length of Stay Meetings, dates discussed:    Additional Comments:  Bethany Nelson, Bethany Aguinaldo Kaye, RN 03/20/2017, 10:49 AM

## 2017-03-21 DIAGNOSIS — Z7189 Other specified counseling: Secondary | ICD-10-CM

## 2017-03-21 DIAGNOSIS — I5043 Acute on chronic combined systolic (congestive) and diastolic (congestive) heart failure: Secondary | ICD-10-CM

## 2017-03-21 LAB — RENAL FUNCTION PANEL
ALBUMIN: 2.8 g/dL — AB (ref 3.5–5.0)
Anion gap: 12 (ref 5–15)
BUN: 118 mg/dL — ABNORMAL HIGH (ref 6–20)
CO2: 30 mmol/L (ref 22–32)
Calcium: 8.8 mg/dL — ABNORMAL LOW (ref 8.9–10.3)
Chloride: 102 mmol/L (ref 101–111)
Creatinine, Ser: 2.14 mg/dL — ABNORMAL HIGH (ref 0.44–1.00)
GFR calc Af Amer: 23 mL/min — ABNORMAL LOW (ref >60)
GFR, EST NON AFRICAN AMERICAN: 19 mL/min — AB (ref >60)
Glucose, Bld: 111 mg/dL — ABNORMAL HIGH (ref 65–99)
PHOSPHORUS: 3.8 mg/dL (ref 2.5–4.6)
POTASSIUM: 4.3 mmol/L (ref 3.5–5.1)
Sodium: 144 mmol/L (ref 135–145)

## 2017-03-21 LAB — GLUCOSE, CAPILLARY
GLUCOSE-CAPILLARY: 104 mg/dL — AB (ref 65–99)
GLUCOSE-CAPILLARY: 157 mg/dL — AB (ref 65–99)

## 2017-03-21 MED ORDER — FUROSEMIDE 80 MG PO TABS: 80.0000 mg | ORAL_TABLET | Freq: Two times a day (BID) | ORAL | Status: DC

## 2017-03-21 MED ORDER — LANTUS SOLOSTAR 100 UNIT/ML ~~LOC~~ SOPN: 18.0000 [IU] | PEN_INJECTOR | SUBCUTANEOUS | 0 refills | Status: DC

## 2017-03-21 MED ORDER — METOPROLOL TARTRATE 12.5 MG HALF TABLET
12.5000 mg | ORAL_TABLET | Freq: Two times a day (BID) | ORAL | Status: DC
  Administered 2017-03-21: 12.5 mg via ORAL
  Filled 2017-03-21: qty 1

## 2017-03-21 NOTE — Discharge Summary (Signed)
Physician Discharge Summary  Bethany Nelson UJW:[id number] DOB: December 07, 1926 DOA: 03/15/2017  PCP: Jerl Mina, MD  Admit date: 03/15/2017 Discharge date: 03/21/2017  Admitted From: Home  Disposition:  SNF  Recommendations for Outpatient Follow-up:  1. Follow up with PCP in 1 week 2. Recommend Palliative Care referral at SNF to continue goals of care conversations.  3. Check BMP in 1 week to ensure stability of potassium.   Discharge Condition: Stable, poor prognosis  CODE STATUS: DNR  Diet recommendation: Heart healthy   Brief/Interim Summary: Bethany Nelson is a 84 y.o.femalewith medical history significant fordiabetes on insulin, dementia, stage IV chronic kidney disease, new diagnosis November 2018 of chronic systolic and diastolic heart failure in the context of severe mitral regurgitation. During that initial diagnosis cardiology was consulted and determined the patient was not a surgical candidate for repair of the mitral valve. Patient was readmitted on 12/31 for pneumonia, respiratory failure, cardiology was consulted and determined patient was hospice appropriategiven her underlying end-stage CHF and inoperable severe MR. Family at that time declined home health hospice, patient was just discharged on 1/5. Patient return back to ED on 1/6 with significant dyspnea, audible wheezing, no significant improvement with albuterol nebs and prednisone at home. Chest x-ray showed interstitial infiltrates increased consistent with pulmonary edema. Patient was placed on BiPAP. She has made slow improvements and is now on room air. See below for further details.   Discharge Diagnoses:  Principal Problem:   Acute on chronic combined systolic and diastolic CHF, NYHA class 4 w/ severe pulmonary hypertension secondary to severe mitral regurg Active Problems:   Diabetes mellitus, insulin dependent (IDDM), uncontrolled (HCC)   Acute respiratory failure (HCC)   CKD (chronic kidney disease)  stage IV   HTN, goal below 140/90   HLD (hyperlipidemia)   FTT (failure to thrive) in adult   Pressure injury of skin  #Acute on chronic combined systolic and diastolic congestive heart failure, with severe pulmonary hypertension secondary to severe mitral regurgitation/elevated troponin: -EF 25-30% with diffuse hypokinesis, grade 3 diastolic dysfunction. Non-surgical candidate for valve surgery. Seen by cardiologist in previous admission; recommended hospice care/end of life but family had declined.  -Patient clinically improved with Lasix IV, resume home PO lasix today. Renal function is improving as well.    #Acute respiratory failure with hypoxia: Improved.  Patient is on room air.  Completed short course of prednisone.  Continue bronchodilators.   #Insulin-dependent diabetes, uncontrolled due to hyperglycemia: Continue current insulin regimen.  Off steroid.  Monitor blood sugar level.    #Chronic kidney disease stage IV: Serum creatinine level improving.  Monitor BMP.  Avoid nephrotoxins.   #Hypokalemia in the setting of diuretics.  Resolved after replacement.   #Essential hypertension: Monitor blood pressure.  Continue current medication.   #Failure to thrive: PT OT evaluation.  Palliative care.  Recommend SNF and hospice.   #Pressure injury of his skin documented as ecchymosis on arms: Continue supportive care.  #Hyperlipidemia: Continue statin.  #Dementia without behavioral disturbance, likely age related: Continue Aricept and supportive care.  #Coughing spells rule out aspiration: Improved.  She had seen by swallow team.   Discharge Instructions  Discharge Instructions    Diet - low sodium heart healthy   Complete by:  As directed    Discharge instructions   Complete by:  As directed    You were cared for by a hospitalist during your hospital stay. If you have any questions about your discharge medications or the care you received  while you were in the  hospital after you are discharged, you can call the unit and asked to speak with the hospitalist on call if the hospitalist that took care of you is not available. Once you are discharged, your primary care physician will handle any further medical issues. Please note that NO REFILLS for any discharge medications will be authorized once you are discharged, as it is imperative that you return to your primary care physician (or establish a relationship with a primary care physician if you do not have one) for your aftercare needs so that they can reassess your need for medications and monitor your lab values.   Increase activity slowly   Complete by:  As directed      Allergies as of 03/21/2017      Reactions   Zithromax [azithromycin] Other (See Comments)   Noted red enhancement of vein distal to IV insertion site after several minutes of IV drug infusion- perfect outline of vein branch- no itching or edema      Medication List    STOP taking these medications   pioglitazone 15 MG tablet Commonly known as:  ACTOS   predniSONE 10 MG tablet Commonly known as:  DELTASONE     TAKE these medications   acetaminophen 500 MG tablet Commonly known as:  TYLENOL Take 1,000 mg by mouth every 6 (six) hours as needed for mild pain.   aspirin EC 81 MG tablet Take 81 mg by mouth every other day.   atorvastatin 40 MG tablet Commonly known as:  LIPITOR Take 1 tablet (40 mg total) by mouth daily.   benzonatate 100 MG capsule Commonly known as:  TESSALON Take 100 mg by mouth 3 (three) times daily as needed for cough.   Biotin 1000 MCG Chew Chew 1,000 mcg by mouth daily.   cetirizine 10 MG tablet Commonly known as:  ZYRTEC Take 10 mg by mouth daily as needed for allergies.   cromolyn 4 % ophthalmic solution Commonly known as:  OPTICROM Place 1 drop into both eyes 4 (four) times daily.   donepezil 5 MG tablet Commonly known as:  ARICEPT Take 5 mg by mouth daily.   dorzolamide-timolol  22.3-6.8 MG/ML ophthalmic solution Commonly known as:  COSOPT Place 1 drop into both eyes 2 (two) times daily.   fluticasone 50 MCG/ACT nasal spray Commonly known as:  FLONASE Place 1 spray into both nostrils daily as needed for allergies.   furosemide 80 MG tablet Commonly known as:  LASIX Take 1 tablet (80 mg total) by mouth 2 (two) times daily.   ipratropium-albuterol 0.5-2.5 (3) MG/3ML Soln Commonly known as:  DUONEB Take 3 mLs by nebulization every 4 (four) hours as needed for wheezing.   LANTUS SOLOSTAR 100 UNIT/ML Solostar Pen Generic drug:  Insulin Glargine Inject 18 Units into the skin every morning. What changed:  how much to take   latanoprost 0.005 % ophthalmic solution Commonly known as:  XALATAN Place 1 drop into both eyes at bedtime.   LEXAPRO 10 MG tablet Generic drug:  escitalopram Take 10 mg by mouth daily.   metoprolol tartrate 25 MG tablet Commonly known as:  LOPRESSOR Take 12.5 mg by mouth 2 (two) times daily.   potassium chloride SA 20 MEQ tablet Commonly known as:  K-DUR,KLOR-CON Take 20 mEq by mouth 2 (two) times daily.   PROVENTIL HFA 108 (90 Base) MCG/ACT inhaler Generic drug:  albuterol Inhale 1 puff into the lungs every 4 (four) hours as needed for wheezing.  Contact information for follow-up providers    Jerl MinaHedrick, James, MD. Schedule an appointment as soon as possible for a visit in 1 week(s).   Specialty:  Family Medicine Contact information: [address] [facility] [place] [id number] [phone]            Contact information for after-discharge care    Destination    [facility] Follow up.   Service:  Skilled Nursing Contact information: [address] [place] [id number] [phone]                 Allergies  Allergen Reactions  . Zithromax [Azithromycin] Other (See Comments)    Noted red enhancement of vein distal to IV insertion site after  several minutes of IV drug infusion- perfect outline of vein branch- no itching or edema    Consultations:  Palliative care    Procedures/Studies: Dg Chest 2 View  Result Date: 03/10/2017 CLINICAL DATA:  Heart failure EXAM: CHEST  2 VIEW COMPARISON:  03/09/2017 FINDINGS: Cardiac shadow is enlarged but stable. Increasing vascular congestion with interstitial edema is noted consistent with congestive failure. Aortic calcifications and mitral valve calcifications are again seen. Persistent left lower lobe infiltrate is noted. IMPRESSION: Increasing congestive failure. Electronically Signed   By: Alcide CleverMark  Lukens M.D.   On: 03/10/2017 09:29   Dg Chest 2 View  Result Date: 03/09/2017 CLINICAL DATA:  Shortness of breath and cough for 2 days EXAM: CHEST  2 VIEW COMPARISON:  02/03/2017 FINDINGS: Normal heart size. Aortic atherosclerosis. Small left pleural effusion. Opacity within the posterior left lower lobe identified compatible is identified on the lateral radiograph. Diffuse chronic interstitial coarsening identified bilaterally. IMPRESSION: 1. Small left pleural effusion and left lower lobe airspace opacity which may represent pneumonia 2.  Aortic Atherosclerosis (ICD10-I70.0). Electronically Signed   By: Signa Kellaylor  Stroud M.D.   On: 03/09/2017 11:23   Dg Chest Portable 1 View  Result Date: 03/15/2017 CLINICAL DATA:  Increased shortness of breath, COPD exacerbation, history CHF, diabetes mellitus, hypertension, stage IV chronic kidney disease EXAM: PORTABLE CHEST 1 VIEW COMPARISON:  Portable exam 1346 hours compared to 03/11/2017 FINDINGS: Enlargement of cardiac silhouette with pulmonary vascular congestion. Interstitial infiltrates increased since previous exam favoring pulmonary edema. Central peribronchial thickening. Atherosclerotic calcification aorta. No gross pleural effusion or pneumothorax. Bones demineralized. IMPRESSION: Probable CHF. Electronically Signed   By: Ulyses SouthwardMark  Boles M.D.   On: 03/15/2017  14:19   Dg Chest Port 1 View  Result Date: 03/11/2017 CLINICAL DATA:  84 year old female with acute respiratory failure. Subsequent encounter. EXAM: PORTABLE CHEST 1 VIEW COMPARISON:  03/10/2017. FINDINGS: Interval decrease in degree of pulmonary vascular congestion. Cardiomegaly.  Mitral valve calcification. Calcified tortuous aorta. Bilateral shoulder joint degenerative changes. IMPRESSION: Interval decrease in degree of pulmonary vascular congestion. Cardiomegaly. Aortic Atherosclerosis (ICD10-I70.0). Electronically Signed   By: Lacy DuverneySteven  Olson M.D.   On: 03/11/2017 18:13      Discharge Exam: Vitals:   03/21/17 0613 03/21/17 0901  BP: (!) 150/83   Pulse: 72   Resp: 18   Temp: (!) 97.3 F (36.3 C)   SpO2: 100% 100%   Vitals:   03/20/17 2031 03/20/17 2355 03/21/17 0613 03/21/17 0901  BP: 124/62 (!) 152/67 (!) 150/83   Pulse: 63 69 72   Resp: (!) 26 18 18    Temp: 98 F (36.7 C) 98 F (36.7 C) (!) 97.3 F (36.3 C)   TempSrc: Oral Oral Oral   SpO2: 100% 100% 100% 100%  Weight:   78 kg (171 lb 15.3 oz)   Height:        General: Pt is alert, awake, not in acute distress Cardiovascular: RRR, S1/S2 + Respiratory: CTA bilaterally, no wheezing, no rhonchi, no respiratory distress, on room air  Abdominal: Soft, NT, ND, bowel sounds + Extremities: no edema, no cyanosis    The results of significant diagnostics from this hospitalization (including imaging, microbiology, ancillary and laboratory) are listed below for reference.     Microbiology: Recent Results (from the past 240 hour(s))  MRSA PCR Screening     Status: None   Collection Time: 03/12/17 10:22 AM  Result Value Ref Range Status   MRSA by PCR NEGATIVE NEGATIVE Final    Comment:        The GeneXpert MRSA Assay (FDA approved for NASAL specimens only), is one component of a comprehensive MRSA colonization surveillance program. It is not intended to diagnose MRSA infection nor to guide or monitor treatment for MRSA  infections.   MRSA PCR Screening     Status: None   Collection Time: 03/15/17  6:48 PM  Result Value Ref Range Status   MRSA by PCR NEGATIVE NEGATIVE Final    Comment:        The GeneXpert MRSA Assay (FDA approved for NASAL specimens only), is one component of a comprehensive MRSA colonization surveillance program. It is not intended to diagnose MRSA infection nor to guide or monitor treatment for MRSA infections.      Labs: BNP (last 3 results) Recent Labs    02/03/17 1022 03/09/17 1029 03/15/17 1320  BNP >4,500.0* 4,093.2* 4,468.1*   Basic Metabolic Panel: Recent Labs  Lab 03/17/17 0535 03/18/17 0347 03/19/17 0833 03/20/17 0250 03/21/17 0641  NA 140 140 143 141 144  K 3.7 3.5 3.0* 4.0 4.3  CL 101 101 101 101 102  CO2 23 29 31  32 30  GLUCOSE 194* 209* 87 188* 111*  BUN 126* 132* 133* 126* 118*  CREATININE 2.66* 2.68* 2.54* 2.35* 2.14*  CALCIUM 8.5* 8.5* 8.8* 8.3* 8.8*  MG  --   --  2.6*  --   --   PHOS  --   --   --   --  3.8   Liver Function Tests: Recent Labs  Lab 03/15/17 1320 03/21/17 0641  AST 23  --   ALT 18  --   ALKPHOS 40  --   BILITOT 0.8  --   PROT 6.0*  --   ALBUMIN 3.0* 2.8*   No results for input(s): LIPASE, AMYLASE in the last 168 hours. No results for input(s): AMMONIA in the last 168 hours. CBC: Recent Labs  Lab 03/15/17 1320  WBC 10.7*  NEUTROABS 9.2*  HGB 9.9*  HCT 32.9*  MCV 98.8  PLT 142*   Cardiac Enzymes: Recent Labs  Lab 03/15/17 1320  TROPONINI 1.29*   BNP: Invalid input(s): POCBNP CBG: Recent Labs  Lab 03/20/17 0743 03/20/17 1138 03/20/17 1637 03/20/17 2054 03/21/17 0733  GLUCAP 147* 183* 230* 323* 104*   D-Dimer No results for input(s): DDIMER in the last 72 hours. Hgb A1c No results for input(s): HGBA1C in the last 72 hours. Lipid Profile No results for input(s): CHOL, HDL, LDLCALC, TRIG, CHOLHDL, LDLDIRECT in the last 72 hours. Thyroid function studies No results for input(s): TSH, T4TOTAL,  T3FREE, THYROIDAB in the last 72 hours.  Invalid input(s): FREET3 Anemia work up No results for input(s): VITAMINB12, FOLATE, FERRITIN, TIBC, IRON, RETICCTPCT in the last 72  hours. Urinalysis    Component Value Date/Time   COLORURINE YELLOW 02/03/2017 0903   APPEARANCEUR HAZY (A) 02/03/2017 0903   APPEARANCEUR Cloudy 05/21/2013 2205   LABSPEC 1.012 02/03/2017 0903   LABSPEC 1.012 05/21/2013 2205   PHURINE 5.0 02/03/2017 0903   GLUCOSEU NEGATIVE 02/03/2017 0903   GLUCOSEU 150 mg/dL 24/40/1027 2536   HGBUR NEGATIVE 02/03/2017 0903   BILIRUBINUR NEGATIVE 02/03/2017 0903   BILIRUBINUR Negative 05/21/2013 2205   KETONESUR NEGATIVE 02/03/2017 0903   PROTEINUR NEGATIVE 02/03/2017 0903   NITRITE NEGATIVE 02/03/2017 0903   LEUKOCYTESUR TRACE (A) 02/03/2017 0903   LEUKOCYTESUR Negative 05/21/2013 2205   Sepsis Labs Invalid input(s): PROCALCITONIN,  WBC,  LACTICIDVEN Microbiology Recent Results (from the past 240 hour(s))  MRSA PCR Screening     Status: None   Collection Time: 03/12/17 10:22 AM  Result Value Ref Range Status   MRSA by PCR NEGATIVE NEGATIVE Final    Comment:        The GeneXpert MRSA Assay (FDA approved for NASAL specimens only), is one component of a comprehensive MRSA colonization surveillance program. It is not intended to diagnose MRSA infection nor to guide or monitor treatment for MRSA infections.   MRSA PCR Screening     Status: None   Collection Time: 03/15/17  6:48 PM  Result Value Ref Range Status   MRSA by PCR NEGATIVE NEGATIVE Final    Comment:        The GeneXpert MRSA Assay (FDA approved for NASAL specimens only), is one component of a comprehensive MRSA colonization surveillance program. It is not intended to diagnose MRSA infection nor to guide or monitor treatment for MRSA infections.      Time coordinating discharge: 40 minutes  SIGNED:  Noralee Stain, DO [facility] Pager [phone]  If 7PM-7AM, please contact  night-coverage [email or web address] Password [facility] 03/21/2017, 10:45 AM

## 2017-03-21 NOTE — Progress Notes (Signed)
Patient will Discharge To: [facility] Anticipated DC Date:03/21/17 Family Notified:Yes Crosby OysterBarbara Sessoms Transport UX:[id number]:PTAR   Per MD [facility] . RN, patient, patient's family, and facility notified of DC. Assessment, Fl2/Pasrr, and Discharge Summary sent to facility. RN given number for report [phone]). DC packet on chart. Ambulance transport requested for patient.   CSW signing off.  Budd Palmerara Antwain Caliendo LCSWA [phone]

## 2017-05-26 ENCOUNTER — Encounter (HOSPITAL_COMMUNITY): Payer: Self-pay

## 2017-05-26 ENCOUNTER — Emergency Department (HOSPITAL_COMMUNITY): Payer: Medicare PPO

## 2017-05-26 ENCOUNTER — Inpatient Hospital Stay (HOSPITAL_COMMUNITY)
Admission: EM | Admit: 2017-05-26 | Discharge: 2017-05-29 | DRG: 291 | Disposition: A | Discharge status: Hospice | Payer: Medicare PPO | Attending: Internal Medicine | Admitting: Internal Medicine

## 2017-05-26 DIAGNOSIS — E1122 Type 2 diabetes mellitus with diabetic chronic kidney disease: Secondary | ICD-10-CM | POA: Diagnosis present

## 2017-05-26 DIAGNOSIS — N186 End stage renal disease: Secondary | ICD-10-CM | POA: Diagnosis present

## 2017-05-26 DIAGNOSIS — R079 Chest pain, unspecified: Secondary | ICD-10-CM | POA: Diagnosis present

## 2017-05-26 DIAGNOSIS — E872 Acidosis: Secondary | ICD-10-CM | POA: Diagnosis present

## 2017-05-26 DIAGNOSIS — R402343 Coma scale, best motor response, flexion withdrawal, at hospital admission: Secondary | ICD-10-CM | POA: Diagnosis present

## 2017-05-26 DIAGNOSIS — R627 Adult failure to thrive: Secondary | ICD-10-CM | POA: Diagnosis present

## 2017-05-26 DIAGNOSIS — N183 Chronic kidney disease, stage 3 unspecified: Secondary | ICD-10-CM

## 2017-05-26 DIAGNOSIS — Z515 Encounter for palliative care: Secondary | ICD-10-CM | POA: Diagnosis present

## 2017-05-26 DIAGNOSIS — I272 Pulmonary hypertension, unspecified: Secondary | ICD-10-CM | POA: Diagnosis present

## 2017-05-26 DIAGNOSIS — I132 Hypertensive heart and chronic kidney disease with heart failure and with stage 5 chronic kidney disease, or end stage renal disease: Principal | ICD-10-CM | POA: Diagnosis present

## 2017-05-26 DIAGNOSIS — F0390 Unspecified dementia without behavioral disturbance: Secondary | ICD-10-CM | POA: Diagnosis present

## 2017-05-26 DIAGNOSIS — R0602 Shortness of breath: Secondary | ICD-10-CM | POA: Diagnosis not present

## 2017-05-26 DIAGNOSIS — F419 Anxiety disorder, unspecified: Secondary | ICD-10-CM | POA: Diagnosis present

## 2017-05-26 DIAGNOSIS — N19 Unspecified kidney failure: Secondary | ICD-10-CM | POA: Diagnosis present

## 2017-05-26 DIAGNOSIS — I5043 Acute on chronic combined systolic (congestive) and diastolic (congestive) heart failure: Secondary | ICD-10-CM | POA: Diagnosis present

## 2017-05-26 DIAGNOSIS — G629 Polyneuropathy, unspecified: Secondary | ICD-10-CM | POA: Diagnosis present

## 2017-05-26 DIAGNOSIS — Z9981 Dependence on supplemental oxygen: Secondary | ICD-10-CM | POA: Diagnosis not present

## 2017-05-26 DIAGNOSIS — I248 Other forms of acute ischemic heart disease: Secondary | ICD-10-CM | POA: Diagnosis present

## 2017-05-26 DIAGNOSIS — J9621 Acute and chronic respiratory failure with hypoxia: Secondary | ICD-10-CM | POA: Diagnosis present

## 2017-05-26 DIAGNOSIS — Z7401 Bed confinement status: Secondary | ICD-10-CM

## 2017-05-26 DIAGNOSIS — Z881 Allergy status to other antibiotic agents status: Secondary | ICD-10-CM

## 2017-05-26 DIAGNOSIS — I5020 Unspecified systolic (congestive) heart failure: Secondary | ICD-10-CM | POA: Diagnosis present

## 2017-05-26 DIAGNOSIS — Z7982 Long term (current) use of aspirin: Secondary | ICD-10-CM

## 2017-05-26 DIAGNOSIS — N184 Chronic kidney disease, stage 4 (severe): Secondary | ICD-10-CM | POA: Diagnosis not present

## 2017-05-26 DIAGNOSIS — Z66 Do not resuscitate: Secondary | ICD-10-CM | POA: Diagnosis present

## 2017-05-26 DIAGNOSIS — I959 Hypotension, unspecified: Secondary | ICD-10-CM | POA: Diagnosis present

## 2017-05-26 DIAGNOSIS — W19XXXA Unspecified fall, initial encounter: Secondary | ICD-10-CM | POA: Diagnosis present

## 2017-05-26 DIAGNOSIS — Z79899 Other long term (current) drug therapy: Secondary | ICD-10-CM

## 2017-05-26 DIAGNOSIS — J441 Chronic obstructive pulmonary disease with (acute) exacerbation: Secondary | ICD-10-CM | POA: Diagnosis present

## 2017-05-26 DIAGNOSIS — R570 Cardiogenic shock: Secondary | ICD-10-CM | POA: Diagnosis present

## 2017-05-26 DIAGNOSIS — N179 Acute kidney failure, unspecified: Secondary | ICD-10-CM | POA: Diagnosis not present

## 2017-05-26 DIAGNOSIS — Y92009 Unspecified place in unspecified non-institutional (private) residence as the place of occurrence of the external cause: Secondary | ICD-10-CM

## 2017-05-26 DIAGNOSIS — J9601 Acute respiratory failure with hypoxia: Secondary | ICD-10-CM | POA: Diagnosis not present

## 2017-05-26 DIAGNOSIS — I340 Nonrheumatic mitral (valve) insufficiency: Secondary | ICD-10-CM | POA: Diagnosis present

## 2017-05-26 DIAGNOSIS — I130 Hypertensive heart and chronic kidney disease with heart failure and stage 1 through stage 4 chronic kidney disease, or unspecified chronic kidney disease: Secondary | ICD-10-CM | POA: Diagnosis present

## 2017-05-26 DIAGNOSIS — N189 Chronic kidney disease, unspecified: Secondary | ICD-10-CM | POA: Diagnosis not present

## 2017-05-26 DIAGNOSIS — Z794 Long term (current) use of insulin: Secondary | ICD-10-CM

## 2017-05-26 DIAGNOSIS — Z7189 Other specified counseling: Secondary | ICD-10-CM

## 2017-05-26 DIAGNOSIS — R402253 Coma scale, best verbal response, oriented, at hospital admission: Secondary | ICD-10-CM | POA: Diagnosis present

## 2017-05-26 LAB — CBC WITH DIFFERENTIAL/PLATELET
Basophils Absolute: 0 10*3/uL (ref 0.0–0.1)
Basophils Relative: 0 %
Eosinophils Absolute: 0.1 10*3/uL (ref 0.0–0.7)
Eosinophils Relative: 1 %
HEMATOCRIT: 30.5 % — AB (ref 36.0–46.0)
HEMOGLOBIN: 9.3 g/dL — AB (ref 12.0–15.0)
LYMPHS ABS: 0.7 10*3/uL (ref 0.7–4.0)
Lymphocytes Relative: 8 %
MCH: 30.5 pg (ref 26.0–34.0)
MCHC: 30.5 g/dL (ref 30.0–36.0)
MCV: 100 fL (ref 78.0–100.0)
Monocytes Absolute: 0.4 10*3/uL (ref 0.1–1.0)
Monocytes Relative: 4 %
NEUTROS ABS: 8.6 10*3/uL — AB (ref 1.7–7.7)
NEUTROS PCT: 87 %
Platelets: 137 10*3/uL — ABNORMAL LOW (ref 150–400)
RBC: 3.05 MIL/uL — AB (ref 3.87–5.11)
RDW: 15.6 % — ABNORMAL HIGH (ref 11.5–15.5)
WBC: 9.7 10*3/uL (ref 4.0–10.5)

## 2017-05-26 LAB — COMPREHENSIVE METABOLIC PANEL
ALT: 11 U/L — ABNORMAL LOW (ref 14–54)
ANION GAP: 21 — AB (ref 5–15)
AST: 19 U/L (ref 15–41)
Albumin: 3.1 g/dL — ABNORMAL LOW (ref 3.5–5.0)
Alkaline Phosphatase: 31 U/L — ABNORMAL LOW (ref 38–126)
BUN: 148 mg/dL — ABNORMAL HIGH (ref 6–20)
CHLORIDE: 102 mmol/L (ref 101–111)
CO2: 16 mmol/L — AB (ref 22–32)
Calcium: 8.1 mg/dL — ABNORMAL LOW (ref 8.9–10.3)
Creatinine, Ser: 9.03 mg/dL — ABNORMAL HIGH (ref 0.44–1.00)
GFR, EST AFRICAN AMERICAN: 4 mL/min — AB (ref >60)
GFR, EST NON AFRICAN AMERICAN: 3 mL/min — AB (ref >60)
Glucose, Bld: 166 mg/dL — ABNORMAL HIGH (ref 65–99)
Potassium: 5 mmol/L (ref 3.5–5.1)
SODIUM: 139 mmol/L (ref 135–145)
Total Bilirubin: 0.6 mg/dL (ref 0.3–1.2)
Total Protein: 6.8 g/dL (ref 6.5–8.1)

## 2017-05-26 LAB — BRAIN NATRIURETIC PEPTIDE

## 2017-05-26 LAB — TROPONIN I: Troponin I: 0.06 ng/mL (ref <0.03)

## 2017-05-26 MED ORDER — ASPIRIN EC 81 MG PO TBEC
81.0000 mg | DELAYED_RELEASE_TABLET | ORAL | Status: DC
  Administered 2017-05-26: 81 mg via ORAL
  Filled 2017-05-26: qty 1

## 2017-05-26 MED ORDER — ATORVASTATIN CALCIUM 40 MG PO TABS
40.0000 mg | ORAL_TABLET | Freq: Every day | ORAL | Status: DC
  Filled 2017-05-26: qty 1

## 2017-05-26 MED ORDER — ACETAMINOPHEN 500 MG PO TABS: 1000.0000 mg | ORAL_TABLET | Freq: Four times a day (QID) | ORAL | Status: DC | PRN

## 2017-05-26 MED ORDER — METHYLPREDNISOLONE SODIUM SUCC 40 MG IJ SOLR
40.0000 mg | Freq: Three times a day (TID) | INTRAMUSCULAR | Status: DC
  Administered 2017-05-26: 40 mg via INTRAVENOUS
  Filled 2017-05-26: qty 1

## 2017-05-26 MED ORDER — MORPHINE SULFATE (PF) 4 MG/ML IV SOLN
0.5000 mg | INTRAVENOUS | Status: DC | PRN
  Filled 2017-05-26: qty 1

## 2017-05-26 MED ORDER — LORAZEPAM 2 MG/ML PO CONC: 1.0000 mg | ORAL | Status: DC | PRN

## 2017-05-26 MED ORDER — ALBUTEROL SULFATE (2.5 MG/3ML) 0.083% IN NEBU
5.0000 mg | INHALATION_SOLUTION | Freq: Once | RESPIRATORY_TRACT | Status: AC
  Administered 2017-05-26: 5 mg via RESPIRATORY_TRACT
  Filled 2017-05-26: qty 6

## 2017-05-26 MED ORDER — METHYLPREDNISOLONE SODIUM SUCC 40 MG IJ SOLR: 40.0000 mg | Freq: Three times a day (TID) | INTRAMUSCULAR | Status: DC

## 2017-05-26 MED ORDER — ASPIRIN 81 MG PO CHEW
324.0000 mg | CHEWABLE_TABLET | Freq: Once | ORAL | Status: AC
  Administered 2017-05-26: 324 mg via ORAL
  Filled 2017-05-26: qty 4

## 2017-05-26 MED ORDER — METHYLPREDNISOLONE SODIUM SUCC 125 MG IJ SOLR
125.0000 mg | Freq: Once | INTRAMUSCULAR | Status: AC
  Administered 2017-05-26: 125 mg via INTRAVENOUS
  Filled 2017-05-26: qty 2

## 2017-05-26 MED ORDER — MORPHINE SULFATE (PF) 4 MG/ML IV SOLN: 2.0000 mg | Freq: Once | INTRAVENOUS | Status: DC

## 2017-05-26 MED ORDER — LORAZEPAM 2 MG/ML IJ SOLN
1.0000 mg | INTRAMUSCULAR | Status: DC | PRN
  Administered 2017-05-27: 1 mg via INTRAVENOUS
  Filled 2017-05-26: qty 1

## 2017-05-26 MED ORDER — FLUTICASONE PROPIONATE 50 MCG/ACT NA SUSP: 1.0000 | Freq: Every day | NASAL | Status: DC | PRN

## 2017-05-26 MED ORDER — SODIUM CHLORIDE 0.9 % IV SOLN: 250.0000 mL | INTRAVENOUS | Status: DC | PRN

## 2017-05-26 MED ORDER — SODIUM CHLORIDE 0.9 % IV SOLN: 1.5000 g | Freq: Three times a day (TID) | INTRAVENOUS | Status: DC

## 2017-05-26 MED ORDER — IPRATROPIUM-ALBUTEROL 0.5-2.5 (3) MG/3ML IN SOLN
3.0000 mL | Freq: Four times a day (QID) | RESPIRATORY_TRACT | Status: DC
  Administered 2017-05-26 – 2017-05-27 (×2): 3 mL via RESPIRATORY_TRACT
  Filled 2017-05-26 (×2): qty 3

## 2017-05-26 MED ORDER — OXYCODONE HCL 5 MG PO TABS: 5.0000 mg | ORAL_TABLET | ORAL | Status: DC | PRN

## 2017-05-26 MED ORDER — LORATADINE 10 MG PO TABS
10.0000 mg | ORAL_TABLET | Freq: Every day | ORAL | Status: DC
  Administered 2017-05-26: 10 mg via ORAL
  Filled 2017-05-26: qty 1

## 2017-05-26 MED ORDER — SODIUM CHLORIDE 0.9% FLUSH
3.0000 mL | INTRAVENOUS | Status: DC | PRN
  Administered 2017-05-26: 3 mL via INTRAVENOUS
  Filled 2017-05-26: qty 3

## 2017-05-26 MED ORDER — LORAZEPAM 1 MG PO TABS: 1.0000 mg | ORAL_TABLET | ORAL | Status: DC | PRN

## 2017-05-26 MED ORDER — CROMOLYN SODIUM 4 % OP SOLN
1.0000 [drp] | Freq: Four times a day (QID) | OPHTHALMIC | Status: DC
  Administered 2017-05-26 – 2017-05-29 (×4): 1 [drp] via OPHTHALMIC
  Filled 2017-05-26: qty 10

## 2017-05-26 MED ORDER — ATROPINE SULFATE 1 % OP SOLN: 4.0000 [drp] | OPHTHALMIC | Status: DC | PRN

## 2017-05-26 MED ORDER — ALBUTEROL SULFATE (2.5 MG/3ML) 0.083% IN NEBU: 2.5000 mg | INHALATION_SOLUTION | RESPIRATORY_TRACT | Status: DC | PRN

## 2017-05-26 MED ORDER — ONDANSETRON HCL 4 MG PO TABS: 4.0000 mg | ORAL_TABLET | Freq: Four times a day (QID) | ORAL | Status: DC | PRN

## 2017-05-26 MED ORDER — ONDANSETRON HCL 4 MG/2ML IJ SOLN: 4.0000 mg | Freq: Four times a day (QID) | INTRAMUSCULAR | Status: DC | PRN

## 2017-05-26 MED ORDER — SODIUM CHLORIDE 0.9% FLUSH
3.0000 mL | Freq: Two times a day (BID) | INTRAVENOUS | Status: DC
  Administered 2017-05-26 – 2017-05-29 (×2): 3 mL via INTRAVENOUS

## 2017-05-26 NOTE — ED Notes (Signed)
Lab called to report Troponin 0.06.  Reported to Dr. Jeraldine LootsLockwood.

## 2017-05-26 NOTE — ED Triage Notes (Signed)
To room via EMS.  Pt was walking across room with sitter when she started having left sided chest and left shoulder pain.  Pt oriented to person only.  On home oxygen @ 3L via Grandfather.  EMS EKG SR. 1st degree HB, LBBB.

## 2017-05-26 NOTE — ED Notes (Signed)
Dr. Jeraldine LootsLockwood made aware of BP.

## 2017-05-26 NOTE — Progress Notes (Signed)
Received report from Carla,RN. Room ready for patient. Bethany Nelson, Drinda Buttsharito Joselita, RCharity fundraiser

## 2017-05-26 NOTE — Progress Notes (Signed)
New Admission Note:   Arrival Method: Patient arrived from ED via stretcher Mental Orientation: Alert and oriented x1 Telemetry: N/A Assessment: Completed Skin: See doc flowsheet IV: Lt FA Pain: See docflowsheet Tubes: N/A Safety Measures: Safety Fall Prevention Plan has been discussed. Admission: Completed 1M Orientation: Patient has been orientated to the room, unit and staff.  Family: Daughter and granddaughter at bedside  Orders have been reviewed and implemented. Will continue to monitor the patient. Call light has been placed within reach and bed alarm has been activated.   Raahi Korber [employer] BSN, RN-BC Phone number: [phone]

## 2017-05-26 NOTE — ED Notes (Signed)
Paged Dr. Sunnie Nielsenegalado, Solu-medrol 125 mg given @ 1531.  Orders to start med 8 hours from 1531.  Order modified.

## 2017-05-26 NOTE — ED Notes (Signed)
Patient's daughter, Oneita HurtBarbara Sessons, would like to be called if conditions change or if Ms. Azucena CecilBurton needs her. [phone]

## 2017-05-26 NOTE — Progress Notes (Signed)
Patient's daughter asked why her Mom is not on the heart monitor,explained to her that the MD did not order it. RN asked daughter if MD talked to her on what plan is and daughter verbalized understanding.Daughter insists though on patient to be placed on telemetry. Text paged MD on call regarding daughter's request,no order received. Patient's daughter very upset and crying  when RN told her that  MD did not want patient on a heart monitor. RN did assure her that her Mom will be monitored by staff but she's upset stating " how?,she's not on a monitor!". Explained to her that staff wil checking  on her Mom. At this time,daughter very upset and crying. Charge nurse made aware of situation Carletta Feasel, Drinda Buttsharito Joselita, RN

## 2017-05-26 NOTE — H&P (Signed)
History and Physical    Bethany Nelson BJY:[id number] DOB: 11/06/1926 DOA: 05/26/2017  PCP: Jerl Mina, MD  Patient coming from: Home   I have personally briefly reviewed patient's old medical records in [facility]  Chief Complaint: Chest pain   HPI: Bethany Nelson is a 84 y.o. female with medical history significant of DM insulin dependent, Stage IV Chronic kidney diseases cr 2.3, COPD on home oxygen,  Chronic systolic and diastolic HF EF 25 -30 % , severe mitral regurgitation, no candidate for surgery on prior evaluation by cardiology, prior admissions for PNA 12-31, also admission for HF exacerbation 1-06, after last discharge she went to rehab, and subsequently home. She was walking in her house with PT , she was going to the kitchen when she started to have chest pain and SOB. She was brought to the ED, she was notice to be in respiratory distress.She was give IV solumedrol, nebulizer, and aspirin which help with SOB. Patient subsequently became hypotensive. Patient was also found to have worsening Renal failure with Cr at 9. Dr Jeraldine Loots discussed with patient's daughter and agreed for admission for comfort measure.   On my evaluation patient is hypotensive, resting, keep eye close, respond to voice. She denies chest pain currently and is breathing better.   I discussed with multiple family members and patient 's Daughter who is POA, patient 's medical condition and critical condition. Plan if to admit patient for comfort care. They understand , difficult situation, regarding hypotension and we are not able to give fluids due to heart failure. Also it was discussed with family IV pressors, and we wont proceed with IV pressors, the goals is for comfort. Daughter was ok with morphine PRN if patient is in distress or in pain.   ED work up; Hb at 9, BNP 4500, troponin 0.06, bicarb 16, cr 9, chest x ray; Cardiomegaly with vascular congestion and mild diffuse interstitial opacities  suspicious for pulmonary edema. . Probable left pleural effusion. Worsening airspace disease at the left lung base may reflect atelectasis or pneumonia.  Review of Systems: As per HPI otherwise 10 point review of systems negative.    Past Medical History:  Diagnosis Date  . Acute respiratory failure (HCC) 03/16/2017  . Chronic diastolic congestive heart failure (HCC)    a. 02/2016: echo showing normal EF, Grade 3 DD, mild MR, trivial TR  . CKD (chronic kidney disease), stage IV (HCC)   . Dementia   . Diabetes mellitus without complication (HCC)   . Hypertension   . Pneumonia 03/10/2017    Past Surgical History:  Procedure Laterality Date  . CATARACT EXTRACTION    . THYROID SURGERY       reports that  has never smoked. she has never used smokeless tobacco. She reports that she does not drink alcohol or use drugs.  Allergies  Allergen Reactions  . Zithromax [Azithromycin] Other (See Comments)    Noted red enhancement of vein distal to IV insertion site after several minutes of IV drug infusion- perfect outline of vein branch- no itching or edema    Family History  Problem Relation Age of Onset  . Esophageal cancer Mother   . Heart disease Neg Hx     Prior to Admission medications   Medication Sig Start Date End Date Taking? Authorizing Provider  acetaminophen (TYLENOL) 500 MG tablet Take 1,000 mg by mouth every 6 (six) hours as needed for mild pain.   Yes [provider]  albuterol (PROVENTIL  HFA) 108 (90 Base) MCG/ACT inhaler Inhale 1 puff into the lungs every 4 (four) hours as needed for wheezing. 08/18/16 08/18/17 Yes [provider]  aspirin EC 81 MG tablet Take 81 mg by mouth every other day.   Yes [provider]  atorvastatin (LIPITOR) 40 MG tablet Take 1 tablet (40 mg total) by mouth daily. 02/06/17  Yes Helberg, Jill Alexanders, MD  Biotin 1000 MCG CHEW Chew 1,000 mcg by mouth daily.    Yes [provider]  cetirizine (ZYRTEC) 10 MG tablet  Take 10 mg by mouth daily as needed for allergies.   Yes [provider]  cromolyn (OPTICROM) 4 % ophthalmic solution Place 1 drop into both eyes 4 (four) times daily.   Yes [provider]  donepezil (ARICEPT) 5 MG tablet Take 5 mg by mouth daily. 05/13/16  Yes [provider]  dorzolamide-timolol (COSOPT) 22.3-6.8 MG/ML ophthalmic solution Place 1 drop into both eyes 2 (two) times daily. 12/16/15  Yes [provider]  escitalopram (LEXAPRO) 10 MG tablet Take 10 mg by mouth daily.    Yes [provider]  fluticasone (FLONASE) 50 MCG/ACT nasal spray Place 1 spray into both nostrils daily as needed for allergies.  02/17/16  Yes [provider]  furosemide (LASIX) 80 MG tablet Take 1 tablet (80 mg total) by mouth 2 (two) times daily. 02/06/17  Yes Helberg, Jill Alexanders, MD  ipratropium-albuterol (DUONEB) 0.5-2.5 (3) MG/3ML SOLN Take 3 mLs by nebulization every 4 (four) hours as needed for wheezing. 01/31/17  Yes [provider]  LANTUS SOLOSTAR 100 UNIT/ML Solostar Pen Inject 18 Units into the skin every morning. Patient taking differently: Inject 18 Units into the skin daily.  03/21/17  Yes Noralee Stain, DO  latanoprost (XALATAN) 0.005 % ophthalmic solution Place 1 drop into both eyes at bedtime. 02/15/16  Yes [provider]  metoprolol tartrate (LOPRESSOR) 25 MG tablet Take 12.5 mg by mouth 2 (two) times daily. 02/24/17  Yes [provider]  potassium chloride SA (K-DUR,KLOR-CON) 20 MEQ tablet Take 20 mEq by mouth 2 (two) times daily.   Yes [provider]    Physical Exam: Vitals:   05/26/17 1600 05/26/17 1615 05/26/17 1630 05/26/17 1645  BP: (!) 83/46 (!) 69/49 (!) 61/43 (!) 79/33  Pulse: 61 79 64 65  Resp: (!) 30 19 16 18   Temp:      TempSrc:      SpO2: 100% 100% 100% 100%    Constitutional: , calm, comfortable, ill appearing.  Vitals:   05/26/17 1600 05/26/17 1615 05/26/17 1630 05/26/17 1645  BP: (!)  83/46 (!) 69/49 (!) 61/43 (!) 79/33  Pulse: 61 79 64 65  Resp: (!) 30 19 16 18   Temp:      TempSrc:      SpO2: 100% 100% 100% 100%   Eyes: PERRL, lids and conjunctivae normal ENMT: Mucous membranes are moist. Posterior pharynx clear of any exudate or lesions.Normal dentition.  Neck: normal, supple, no masses, no thyromegaly Respiratory: Tachypnea, bilateral crackles.  Cardiovascular: Regular rate and rhythm, systolic  murmurs / rubs / gallops. No extremity edema. Abdomen: no tenderness, no masses palpated. No hepatosplenomegaly. Bowel sounds positive.  Musculoskeletal: no clubbing / cyanosis. No joint deformity upper and lower extremities. Good ROM, no contractures. Normal muscle tone.  Skin: no rashes, lesions, ulcers. No induration Neurologic: sleepy, answer few questions.  Psychiatric:unable to assess.   Labs on Admission: I have personally reviewed following labs and imaging studies  CBC: Recent Labs  Lab 05/26/17 1423  WBC 9.7  NEUTROABS 8.6*  HGB 9.3*  HCT 30.5*  MCV 100.0  PLT 137*   Basic Metabolic Panel: Recent Labs  Lab 05/26/17 1423  NA 139  K 5.0  CL 102  CO2 16*  GLUCOSE 166*  BUN 148*  CREATININE 9.03*  CALCIUM 8.1*   GFR: CrCl cannot be calculated (Unknown ideal weight.). Liver Function Tests: Recent Labs  Lab 05/26/17 1423  AST 19  ALT 11*  ALKPHOS 31*  BILITOT 0.6  PROT 6.8  ALBUMIN 3.1*   No results for input(s): LIPASE, AMYLASE in the last 168 hours. No results for input(s): AMMONIA in the last 168 hours. Coagulation Profile: No results for input(s): INR, PROTIME in the last 168 hours. Cardiac Enzymes: Recent Labs  Lab 05/26/17 1423  TROPONINI 0.06*   BNP (last 3 results) Recent Labs    07/03/16 1350  PROBNP 4,153*   HbA1C: No results for input(s): HGBA1C in the last 72 hours. CBG: No results for input(s): GLUCAP in the last 168 hours. Lipid Profile: No results for input(s): CHOL, HDL, LDLCALC, TRIG, CHOLHDL, LDLDIRECT  in the last 72 hours. Thyroid Function Tests: No results for input(s): TSH, T4TOTAL, FREET4, T3FREE, THYROIDAB in the last 72 hours. Anemia Panel: No results for input(s): VITAMINB12, FOLATE, FERRITIN, TIBC, IRON, RETICCTPCT in the last 72 hours. Urine analysis:    Component Value Date/Time   COLORURINE YELLOW 02/03/2017 0903   APPEARANCEUR HAZY (A) 02/03/2017 0903   APPEARANCEUR Cloudy 05/21/2013 2205   LABSPEC 1.012 02/03/2017 0903   LABSPEC 1.012 05/21/2013 2205   PHURINE 5.0 02/03/2017 0903   GLUCOSEU NEGATIVE 02/03/2017 0903   GLUCOSEU 150 mg/dL 16/10/960403/14/2015 [id number]   HGBUR NEGATIVE 02/03/2017 0903   BILIRUBINUR NEGATIVE 02/03/2017 0903   BILIRUBINUR Negative 05/21/2013 2205   KETONESUR NEGATIVE 02/03/2017 0903   PROTEINUR NEGATIVE 02/03/2017 0903   NITRITE NEGATIVE 02/03/2017 0903   LEUKOCYTESUR TRACE (A) 02/03/2017 0903   LEUKOCYTESUR Negative 05/21/2013 2205    Radiological Exams on Admission: Dg Chest Port 1 View  Result Date: 05/26/2017 CLINICAL DATA:  Shortness of breath EXAM: PORTABLE CHEST 1 VIEW COMPARISON:  03/15/2017 FINDINGS: Cardiomegaly with vascular congestion and mild diffuse interstitial opacity suspect for edema. Airspace disease at the left lung base. Probable left pleural effusion. Aortic atherosclerosis. No pneumothorax. Mitral annular calcification. IMPRESSION: 1. Cardiomegaly with vascular congestion and mild diffuse interstitial opacities suspicious for pulmonary edema 2. Probable left pleural effusion. Worsening airspace disease at the left lung base may reflect atelectasis or pneumonia. Electronically Signed   By: Jasmine PangKim  Fujinaga M.D.   On: 05/26/2017 15:04    EKG: Independently reviewed. Sinus rhythm, prolong PR  Assessment/Plan Active Problems:   Heart failure with reduced ejection fraction (HCC)   Type 2 diabetes mellitus with stage 3 chronic kidney disease, with long-term current use of insulin (HCC)   CKD (chronic kidney disease), stage IV (HCC)    Hypertensive heart and chronic kidney disease with heart failure and stage 1 through stage 4 chronic kidney disease, or chronic kidney disease (HCC)   Acute respiratory failure with hypoxia (HCC)  1-Acute on chronic Respiratory failure, secondary to Acute on chronic systolic HF exacerbation; CODP exacerbation.  Admit to hospital for comfort measure.  IV solumedrol to help with COPD exacerbation and symptoms. Schedule nebulizer.  Will hold on IV lasix due to hypotension;  Palliative care team consulted.  IV morphine PRN for Dyspnea and respiratory distress.  Atropine PRN for increase secretion.   2-Hypotension; suspect  related to cardiogenic shock. Patient is afebrile, no leukocytosis. Discussed with family , no IV pressors.   3-Acute on chronic Stage IV renal failure, suspect related to hypoperfusion from HF.  Not candidate for HD.   4-Chest pain; resolved. Suspect related to demand ischemia.      DVT prophylaxis: comfort care.  Code Status: DNR Family Communication: Care discussed with daughter  Disposition Plan: to be determine Consults called: palliative care.  Admission status: Inpatient.    Alba Cory MD [facility] Pager [phone]  If 7PM-7AM, please contact night-coverage [email or web address] Password [facility]  05/26/2017, 5:49 PM

## 2017-05-26 NOTE — ED Notes (Signed)
Dr. Jeraldine LootsLockwood made aware of BP.  Hold Morphine for now. Pt is resting quietly at this time.

## 2017-05-26 NOTE — ED Provider Notes (Signed)
[facility] EMERGENCY DEPARTMENT Provider Note   CSN: [id number] Arrival date & time: 05/26/17  1415     History   Chief Complaint Chief Complaint  Patient presents with  . Chest Pain    HPI Bethany Nelson is a 84 y.o. female.  HPI  Patient presents from home with concern of exertional dyspnea. Patient has multiple medical issues, including dementia. Level 5 caveat secondary to this. However, the patient seemingly answers questions about her current status appropriately.  She cannot specify when her pain starts, but states that her chest hurts, feels tight. Reportedly the patient was ambulating across the room at home, when she complained of chest tightness. No reported new dyspnea, though she has baseline oxygen dependency, and dyspnea She has history of CHF, COPD, and severe mitral regurgitation, and after a recent hospitalization, had initiation of palliative care services due to her status as a non-candidate for surgical repair and multiple other medical issues. EMS notes the patient was dyspneic on arrival, borderline hypotensive, but awake and alert.   Past Medical History:  Diagnosis Date  . Acute respiratory failure (HCC) 03/16/2017  . Chronic diastolic congestive heart failure (HCC)    a. 02/2016: echo showing normal EF, Grade 3 DD, mild MR, trivial TR  . CKD (chronic kidney disease), stage IV (HCC)   . Dementia   . Diabetes mellitus without complication (HCC)   . Hypertension   . Pneumonia 03/10/2017    Patient Active Problem List   Diagnosis Date Noted  . Counseling regarding end of life decision making   . Pressure injury of skin 03/16/2017  . Acute on chronic combined systolic and diastolic CHF, NYHA class 4 w/ severe pulmonary hypertension secondary to severe mitral regurg 03/15/2017  . Diabetes mellitus, insulin dependent (IDDM), uncontrolled (HCC) 03/15/2017  . Acute respiratory failure (HCC) 03/15/2017  . CKD (chronic kidney  disease) stage IV 03/15/2017  . HTN, goal below 140/90 03/15/2017  . HLD (hyperlipidemia) 03/15/2017  . FTT (failure to thrive) in adult 03/15/2017  . Acute on chronic combined systolic and diastolic heart failure (HCC) 03/09/2017  . Acute respiratory failure with hypoxemia (HCC) 03/09/2017  . Severe mitral regurgitation 03/09/2017  . Moderate to severe pulmonary hypertension (HCC) 03/09/2017  . Dementia 03/09/2017  . Diabetes mellitus type 2, insulin dependent (HCC) 03/09/2017  . CKD (chronic kidney disease) stage 4, GFR 15-29 ml/min (HCC) 03/09/2017  . HCAP (healthcare-associated pneumonia) 03/09/2017  . Palliative care by specialist   . Advance care planning   . Mitral valve disorder   . Decreased urine output   . Dyspnea 02/03/2017  . Arthritis 07/16/2016  . Depression 07/16/2016  . Thyroid disease 07/16/2016  . Elevated troponin 06/22/2016  . Acute respiratory failure with hypoxia (HCC) 06/22/2016  . 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 Acute on chronic diastolic heart failure (HCC) 05/12/2016  . Hyperlipidemia, unspecified 05/12/2016  . Heart failure with reduced ejection fraction (HCC) 02/21/2016  . Hypertension 02/21/2016  . Type 2 diabetes mellitus with stage 3 chronic kidney disease, with long-term current use of insulin (HCC) 02/21/2016  . CKD (chronic kidney disease), stage IV (HCC) 02/21/2016  . New onset of congestive heart failure (HCC) 02/21/2016    Past Surgical History:  Procedure Laterality Date  . CATARACT EXTRACTION    . THYROID SURGERY      OB History  No data available       Home Medications    Prior to Admission medications   Medication Sig Start Date End Date Taking? Authorizing Provider  acetaminophen (TYLENOL) 500 MG tablet Take 1,000 mg by mouth every 6 (six) hours as needed for mild pain.    [provider]  albuterol (PROVENTIL HFA) 108 (90 Base) MCG/ACT inhaler Inhale 1 puff into the lungs every 4 (four) hours as needed for wheezing. 08/18/16 08/18/17  [provider]  aspirin EC 81 MG tablet Take 81 mg by mouth every other day.    [provider]  atorvastatin (LIPITOR) 40 MG tablet Take 1 tablet (40 mg total) by mouth daily. 02/06/17   Levora DredgeHelberg, Justin, MD  benzonatate (TESSALON) 100 MG capsule Take 100 mg by mouth 3 (three) times daily as needed for cough. 03/07/17   [provider]  Biotin 1000 MCG CHEW Chew 1,000 mcg by mouth daily.     [provider]  cetirizine (ZYRTEC) 10 MG tablet Take 10 mg by mouth daily as needed for allergies.    [provider]  cromolyn (OPTICROM) 4 % ophthalmic solution Place 1 drop into both eyes 4 (four) times daily.    [provider]  donepezil (ARICEPT) 5 MG tablet Take 5 mg by mouth daily. 05/13/16   [provider]  dorzolamide-timolol (COSOPT) 22.3-6.8 MG/ML ophthalmic solution Place 1 drop into both eyes 2 (two) times daily. 12/16/15   [provider]  escitalopram (LEXAPRO) 10 MG tablet Take 10 mg by mouth daily.     [provider]  fluticasone (FLONASE) 50 MCG/ACT nasal spray Place 1 spray into both nostrils daily as needed for allergies.  02/17/16   [provider]  furosemide (LASIX) 80 MG tablet Take 1 tablet (80 mg total) by mouth 2 (two) times daily. 02/06/17   Helberg, Jill AlexandersJustin, MD  ipratropium-albuterol (DUONEB) 0.5-2.5 (3) MG/3ML SOLN Take 3 mLs by nebulization every 4 (four) hours as needed for wheezing. 01/31/17   [provider]  LANTUS SOLOSTAR 100 UNIT/ML Solostar Pen Inject 18 Units into the skin every morning. 03/21/17   Noralee Stainhoi, Jennifer, DO  latanoprost (XALATAN) 0.005 % ophthalmic solution Place 1 drop into both eyes at bedtime. 02/15/16   [provider]  metoprolol tartrate (LOPRESSOR) 25 MG tablet Take 12.5 mg by mouth 2 (two) times daily.  02/24/17   [provider]  potassium chloride SA (K-DUR,KLOR-CON) 20 MEQ tablet Take 20 mEq by mouth 2 (two) times daily.    [provider]    Family History Family History  Problem Relation Age of Onset  . Esophageal cancer Mother   . Heart disease Neg Hx     Social History Social History   Tobacco Use  . Smoking status: Never Smoker  . Smokeless tobacco: Never Used  Substance Use Topics  . Alcohol use: No  . Drug use: No     Allergies   Zithromax [azithromycin]   Review of Systems Review of Systems  Unable to perform ROS: Dementia     Physical Exam Updated Vital Signs SpO2 94%   Physical Exam  Constitutional: She has a sickly appearance.  Uncomfortable appearing elderly female  HENT:  Head: Normocephalic and atraumatic.  Eyes: Conjunctivae and EOM are normal.  Cardiovascular: Normal rate and regular rhythm.  Pulmonary/Chest: No stridor. Tachypnea noted. She has decreased breath sounds. She has wheezes. She has rhonchi.  Abdominal: She exhibits no distension.  Musculoskeletal: She exhibits no edema.  Neurological: She is alert. She displays atrophy. She displays no tremor. No cranial nerve deficit. She displays no seizure activity.  Skin: Skin is warm and dry.  Psychiatric: She is slowed. Cognition and memory are impaired.  Nursing note and vitals reviewed.    ED Treatments / Results  Labs (all labs ordered are listed, but only abnormal results are displayed) Labs Reviewed  COMPREHENSIVE METABOLIC PANEL  CBC WITH DIFFERENTIAL/PLATELET  TROPONIN I  BRAIN NATRIURETIC PEPTIDE    EKG  EKG Interpretation  Date/Time:  Tuesday May 26 2017 14:22:18 EDT Ventricular Rate:  63 PR Interval:    QRS Duration: 144 QT Interval:  499 QTC Calculation: 511 R Axis:   -23 Text Interpretation:  Sinus rhythm Prolonged PR interval Left bundle branch block No significant change since last tracing Abnormal ekg Confirmed by Gerhard Munch [phone])  on 05/26/2017 2:27:52 PM       Radiology No results found.  Procedures Procedures (including critical care time)  Medications Ordered in ED Medications  aspirin chewable tablet 324 mg (not administered)  albuterol (PROVENTIL) (2.5 MG/3ML) 0.083% nebulizer solution 5 mg (not administered)  methylPREDNISolone sodium succinate (SOLU-MEDROL) 125 mg/2 mL injection 125 mg (not administered)     Initial Impression / Assessment and Plan / ED Course  I have reviewed the triage vital signs and the nursing notes.  Pertinent labs & imaging results that were available during my care of the patient were reviewed by me and considered in my medical decision making (see chart for details).   After the initial evaluation I reviewed the patient's chart, notable for recent discharge, following evaluation for respiratory distress. Notably, on discharge, the patient was scheduled to enroll in palliative care services given her multiple medical issues.  3:53 PM Family now present. We discussed the patient's recent hospitalization, and decline over the past few weeks since that time.  The patient is not formally enrolled in hospice care, though the daughter notes that she has had that discussion with advanced home health services. Patient remains in similar condition, with dyspnea, describing chest pain. She remains hypotensive as well. We discussed difficulty with treatment given the patient's hypotension, multiple medical issues, and agreed that the patient will receive morphine, ongoing albuterol, pending additional lab results. 5:03 PM Labs resulting, most notable for creatinine of 9, substantial increase since recent hospitalization when the creatinine was 2. Patient has positive troponin value as well, and given x-ray findings concerning for fluid overload status, there is suspicion for multi-factorial etiology given today's episode of chest pain, dyspnea, with consideration of both renal failure and  worsening heart failure, contributing. I discussed these findings with the patient's daughter, and agreed that the patient is appropriate for hospice care.   She herself appears calm, remains hypotensive.  6:26 PM Patient in similar condition. Remaining labs notable for BNP beyond calculable value. Given this, her x-ray, her acute renal failure, all findings consistent with discussion I had with the multiple family members about patient's cumulative disease burden concerning for end-of-life stage. We confirmed the patient's resuscitation status, and I discussed her case with her hospice physician Patient will have hospice consult, performed tomorrow, will be admitted to the hospitalist team for ongoing comfort care measures. Final Clinical Impressions(s) / ED Diagnoses  Acute renal failure Acute exacerbation of congestive heart failure   Gerhard Munch, MD 05/26/17 1827

## 2017-05-27 DIAGNOSIS — J9621 Acute and chronic respiratory failure with hypoxia: Secondary | ICD-10-CM

## 2017-05-27 DIAGNOSIS — N189 Chronic kidney disease, unspecified: Secondary | ICD-10-CM

## 2017-05-27 DIAGNOSIS — Z515 Encounter for palliative care: Secondary | ICD-10-CM

## 2017-05-27 DIAGNOSIS — N184 Chronic kidney disease, stage 4 (severe): Secondary | ICD-10-CM

## 2017-05-27 DIAGNOSIS — I5043 Acute on chronic combined systolic (congestive) and diastolic (congestive) heart failure: Secondary | ICD-10-CM

## 2017-05-27 DIAGNOSIS — J9601 Acute respiratory failure with hypoxia: Secondary | ICD-10-CM

## 2017-05-27 MED ORDER — MORPHINE SULFATE (PF) 2 MG/ML IV SOLN: 1.0000 mg | INTRAVENOUS | Status: DC | PRN

## 2017-05-27 MED ORDER — HALOPERIDOL 1 MG PO TABS
0.5000 mg | ORAL_TABLET | ORAL | Status: DC | PRN
  Filled 2017-05-27: qty 1

## 2017-05-27 MED ORDER — LORAZEPAM 2 MG/ML IJ SOLN
0.5000 mg | INTRAMUSCULAR | Status: DC | PRN
Start: 2017-05-27 — End: 2017-05-29

## 2017-05-27 MED ORDER — MORPHINE SULFATE (PF) 4 MG/ML IV SOLN: 1.0000 mg | INTRAVENOUS | Status: DC | PRN

## 2017-05-27 MED ORDER — LORAZEPAM 2 MG/ML PO CONC: 0.5000 mg | ORAL | Status: DC | PRN

## 2017-05-27 MED ORDER — LORAZEPAM 0.5 MG PO TABS: 0.5000 mg | ORAL_TABLET | ORAL | Status: DC | PRN

## 2017-05-27 MED ORDER — SODIUM CHLORIDE 0.9% FLUSH: 3.0000 mL | INTRAVENOUS | Status: DC | PRN

## 2017-05-27 MED ORDER — SODIUM CHLORIDE 0.9 % IV SOLN: 250.0000 mL | INTRAVENOUS | Status: DC | PRN

## 2017-05-27 MED ORDER — MORPHINE SULFATE (CONCENTRATE) 10 MG/0.5ML PO SOLN: 2.5000 mg | Freq: Two times a day (BID) | ORAL | Status: DC

## 2017-05-27 MED ORDER — BIOTENE DRY MOUTH MT LIQD: 15.0000 mL | OROMUCOSAL | Status: DC | PRN

## 2017-05-27 MED ORDER — HALOPERIDOL LACTATE 5 MG/ML IJ SOLN: 0.5000 mg | INTRAMUSCULAR | Status: DC | PRN

## 2017-05-27 MED ORDER — MORPHINE SULFATE (CONCENTRATE) 10 MG/0.5ML PO SOLN
5.0000 mg | Freq: Three times a day (TID) | ORAL | Status: DC
  Administered 2017-05-27: 5 mg via ORAL
  Filled 2017-05-27: qty 0.5

## 2017-05-27 MED ORDER — SODIUM CHLORIDE 0.9% FLUSH
3.0000 mL | Freq: Two times a day (BID) | INTRAVENOUS | Status: DC
  Administered 2017-05-27 – 2017-05-29 (×4): 3 mL via INTRAVENOUS

## 2017-05-27 MED ORDER — GLYCOPYRROLATE 0.2 MG/ML IJ SOLN
0.2000 mg | INTRAMUSCULAR | Status: DC | PRN
  Filled 2017-05-27: qty 1

## 2017-05-27 MED ORDER — WHITE PETROLATUM EX OINT
TOPICAL_OINTMENT | CUTANEOUS | Status: AC
  Filled 2017-05-27: qty 28.35

## 2017-05-27 MED ORDER — GLYCOPYRROLATE 1 MG PO TABS
1.0000 mg | ORAL_TABLET | ORAL | Status: DC | PRN
  Filled 2017-05-27: qty 1

## 2017-05-27 MED ORDER — ACETAMINOPHEN 160 MG/5ML PO SOLN: 650.0000 mg | Freq: Three times a day (TID) | ORAL | Status: DC

## 2017-05-27 MED ORDER — IPRATROPIUM-ALBUTEROL 0.5-2.5 (3) MG/3ML IN SOLN
3.0000 mL | Freq: Three times a day (TID) | RESPIRATORY_TRACT | Status: DC
  Administered 2017-05-27 – 2017-05-28 (×4): 3 mL via RESPIRATORY_TRACT
  Filled 2017-05-27 (×4): qty 3

## 2017-05-27 MED ORDER — POLYVINYL ALCOHOL 1.4 % OP SOLN
1.0000 [drp] | Freq: Four times a day (QID) | OPHTHALMIC | Status: DC | PRN
  Filled 2017-05-27: qty 15

## 2017-05-27 MED ORDER — HALOPERIDOL LACTATE 2 MG/ML PO CONC
0.5000 mg | ORAL | Status: DC | PRN
  Filled 2017-05-27: qty 0.3

## 2017-05-27 NOTE — Clinical Social Work Note (Signed)
CSW informed by nursing that patient now comfort care and [facility]. [facility] with [facility] and referral made and [facility], she will meet with family this afternoon and follow-up with CSW. Patient daughter at the bedside and informed that [facility] representative will be in to talk with her. CSW will continue to follow and assist with discharge to a residential hospice facility.  Genelle BalVanessa Valgene Deloatch, MSW, LCSW Licensed Clinical Social Worker Clinical Social Work Department [facility] [phone]

## 2017-05-27 NOTE — Progress Notes (Signed)
Visited with patient and family.  Family very appreciative. Had prayer for this transition time and for peace. Phebe CollaDonna S Javan Gonzaga, Chaplain    05/27/17 1600  Clinical Encounter Type  Visited With Patient and family together  Visit Type Initial;Spiritual support;Critical Care  Referral From Palliative care team  Consult/Referral To Chaplain  Spiritual Encounters  Spiritual Needs Prayer;Emotional

## 2017-05-27 NOTE — Progress Notes (Signed)
PROGRESS NOTE   Bethany Nelson  ZOX:[id number]    DOB: 05/07/1926    DOA: 05/26/2017  PCP: Jerl MinaHedrick, James, MD   I have briefly reviewed patients previous medical [facility].  Brief Narrative:  84 year old female with PMH of IDDM, stage IV chronic kidney disease with baseline creatinine of about mid 2 range, COPD on home oxygen, chronic combined systolic and diastolic CHF, EF 81-19%25-30%, severe MR for which she is not a candidate for surgery, recent admissions twice in January and once in November for decompensated CHF, she had gone to rehab after last discharge and subsequently home, presented with dyspnea and chest pain.  In the ED, she was noted to be in respiratory distress, treated with IV Solu-Medrol, nebulizers and aspirin which helped her dyspnea but she became hypotensive and noted to have worsening renal insufficiency with creatinine of 9.  EDP and admitting physician discussed with patient's daughter and patient was admitted for full comfort care.  Palliative team consulted.   Assessment & Plan:   Active Problems:   Heart failure with reduced ejection fraction (HCC)   Type 2 diabetes mellitus with stage 3 chronic kidney disease, with long-term current use of insulin (HCC)   CKD (chronic kidney disease), stage IV (HCC)   Hypertensive heart and chronic kidney disease with heart failure and stage 1 through stage 4 chronic kidney disease, or chronic kidney disease (HCC)   Acute respiratory failure with hypoxia (HCC)   Renal failure   Acute on chronic respiratory failure with hypoxia: Acute respiratory failure likely due to decompensated CHF and COPD exacerbation.  Treated in ED with IV Solu-Medrol and bronchodilators.  Dyspnea improved.  Now transitioned to full comfort care.  Saturating at 99% on 2 L/min [place] oxygen.  Acute on chronic combined systolic and diastolic CHF/moderate to severe pulmonary hypertension/severe mitral regurgitation: Unable to diurese on admission due  to hypotension/cardiogenic shock.  Now transitioned to full comfort care.  COPD exacerbation: No clinical bronchospasm at this time.  Hypotension/possible cardiogenic shock: Full comfort care.  Acute on stage IV chronic kidney disease/metabolic acidosis: Creatinine went up from 2.14 on 1/12-9.03 on this admission.  May be due to cardiorenal syndrome/cardiogenic shock versus due to cardiac medications/diuretics.  Not candidate for any aggressive interventions/HD.  Transitioned to full comfort care.  Chest pain/elevated troponin: As needed Roxanol.  Adult failure to thrive: Multifactorial due to advanced age, frail physical status and multiple severe significant comorbidities.  Palliative care consulted.  Likely appropriate for DC to residential hospice.   DVT prophylaxis: None due to comfort care. Code Status: DNR Family Communication: Discussed in detail with patient's daughter at bedside.  Updated care and answered questions. Disposition: To be determined pending palliative care team input.  Likely DC to residential hospice pending bed availability.   Consultants:  Palliative care medicine  Procedures:  None  Antimicrobials:  None   Subjective: Sleeping and did not attempt to wake her up.  Discussed in detail with patient's daughter at bedside.  Daughter states that she has been with the patient all night.  Reported some dyspnea overnight but has been sleeping comfortably since 4:30 AM.  As per RN, no acute issues reported.  ROS: As above.  Objective:  Vitals:   05/27/17 0109 05/27/17 0203 05/27/17 0742 05/27/17 0900  BP:  (!) 87/48  (!) 92/55  Pulse:    62  Resp:    18  Temp:    98.2 F (36.8 C)  TempSrc:  Oral  SpO2: 99%  98% 99%  Weight:      Height:        Examination:  General exam: Elderly female, moderately built and nourished, lying comfortably supine in bed.  Does not appear in any distress. Respiratory system: Clear to auscultation anteriorly,  diminished breath sounds in the bases with occasional basal crackles. Respiratory effort normal. Cardiovascular system: S1 & S2 heard, RRR. No JVD, murmurs, rubs, gallops or clicks. No pedal edema. Gastrointestinal system: Abdomen is nondistended, soft and nontender. No organomegaly or masses felt. Normal bowel sounds heard. Central nervous system: Patient was sleeping and did not arouse her to examine.  Extremities: No clubbing, cyanosis or edema. Skin: Not examined. Psychiatry: Not examined.    Data Reviewed: I have personally reviewed following labs and imaging studies  CBC: Recent Labs  Lab 05/26/17 1423  WBC 9.7  NEUTROABS 8.6*  HGB 9.3*  HCT 30.5*  MCV 100.0  PLT 137*   Basic Metabolic Panel: Recent Labs  Lab 05/26/17 1423  NA 139  K 5.0  CL 102  CO2 16*  GLUCOSE 166*  BUN 148*  CREATININE 9.03*  CALCIUM 8.1*   Liver Function Tests: Recent Labs  Lab 05/26/17 1423  AST 19  ALT 11*  ALKPHOS 31*  BILITOT 0.6  PROT 6.8  ALBUMIN 3.1*   Coagulation Profile: No results for input(s): INR, PROTIME in the last 168 hours. Cardiac Enzymes: Recent Labs  Lab 05/26/17 1423  TROPONINI 0.06*   HbA1C: No results for input(s): HGBA1C in the last 72 hours. CBG: No results for input(s): GLUCAP in the last 168 hours.  No results found for this or any previous visit (from the past 240 hour(s)).       Radiology Studies: Dg Chest Port 1 View  Result Date: 05/26/2017 CLINICAL DATA:  Shortness of breath EXAM: PORTABLE CHEST 1 VIEW COMPARISON:  03/15/2017 FINDINGS: Cardiomegaly with vascular congestion and mild diffuse interstitial opacity suspect for edema. Airspace disease at the left lung base. Probable left pleural effusion. Aortic atherosclerosis. No pneumothorax. Mitral annular calcification. IMPRESSION: 1. Cardiomegaly with vascular congestion and mild diffuse interstitial opacities suspicious for pulmonary edema 2. Probable left pleural effusion. Worsening  airspace disease at the left lung base may reflect atelectasis or pneumonia. Electronically Signed   By: Jasmine Pang M.D.   On: 05/26/2017 15:04        Scheduled Meds: . cromolyn  1 drop Both Eyes QID  . ipratropium-albuterol  3 mL Nebulization TID  . morphine CONCENTRATE  5 mg Oral Q8H  . sodium chloride flush  3 mL Intravenous Q12H  . sodium chloride flush  3 mL Intravenous Q12H  . white petrolatum       Continuous Infusions: . sodium chloride    . sodium chloride       LOS: 1 day     Marcellus Scott, MD, FACP, [facility]. [facility] Pager [phone]  If 7PM-7AM, please contact night-coverage [email or web address] Password [email or web address] 05/27/2017, 12:00 PM

## 2017-05-27 NOTE — Progress Notes (Signed)
[facility]  Patient Status: Active (receiving services up to time of hospitalization)  [facility] is providing the following services: RN, PT and OT  If patient discharges after hours, please call [phone].   Bethany FurnishDonna Nelson 05/27/2017, 11:02 AM

## 2017-05-27 NOTE — Progress Notes (Signed)
[facility] Liaison: RN visit  Received request from Genelle BalVanessa Crawford, CSW for family interest [facility]. Chart reviewed and spoke with family, daughterBritta Mccreedy- Barbara to acknowledge referral. Unfortunately [facility] is not able to offer a room today. Family and CSW are aware. [facility] liaison will follow up with CSW and family tomorrow or sooner if room becomes available.  Please do not hesitate to call with questions.   Thank you for this referral.   Elsie SaasMary Anne Robertson, RN, [facility] Liaison  [phone]  [facility] hospital liaisons are on AMION.

## 2017-05-27 NOTE — Progress Notes (Signed)
Received from 36M, patient was non verbal, respond to pain stimuli. Family at bedside on transfer. Oriented family to the unit and staff. Will continue to monitor.

## 2017-05-27 NOTE — Consult Note (Signed)
Consultation Note Date: 05/27/2017   Patient Name: Bethany Nelson  DOB: [birth year]  MRN: [id number]  Age / Sex: 84 y.o., female  PCP: Maryland Pink, MD Referring Physician: Modena Jansky, MD  Reason for Consultation: Establishing goals of care, Psychosocial/spiritual support and Terminal Care  HPI/Patient Profile: 84 y.o. female  with past medical history of dementia, CKD stg 4, mixed heart failure with an LVEF of 25-30%, COPD on home oxygen, DM, and severe mitral regurgitation (not a candidate for surgery) who was admitted on 05/26/2017 with chest pain.  At the time of admission she was found to have a creatinine of 9 (baseline 2.5), and BNP of 4500. She was admitted for comfort measures only.  Clinical Assessment and Goals of Care:  I have reviewed medical records including EPIC notes, labs and imaging, assessed the patient and then met at the bedside along with her daughter, Bethany Nelson  to discuss diagnosis prognosis, [place], EOL wishes, disposition and options.  Bethany Nelson needs to go to an appointment and we agree to meet again at 1:00 pm.  I introduced Palliative Medicine as specialized medical care for people living with serious illness. It focuses on providing relief from the symptoms and stress of a serious illness. The goal is to improve quality of life for both the patient and the family.  At 1:00 pm I met with Bethany Nelson (dtr) and her husband.  We discussed a brief life review of the patient. 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  She went to work when the kids were in high school.  She had a wonderful marriage to a man that loved her dearly.  She currently lives with her daughter and is cared for by her daughter, son in law and an aid in the home Mardene Celeste).  Bethany Nelson expressed frustration over the hospital attempting to "force" hospice on the family in the past.  It just was not time yet.  Now  - give her kidney failure, and the patient's change of heart (she refuses to work with PT) Bethany Nelson understands we are in a different place.  Bethany Nelson told me - her creatinine is 9.0.  Barbara's daughter is a Tourist information centre manager with the [employer] system. She explained to Bethany Nelson that it is likely time for hospice now.    Bethany Nelson and her husband do not want Giovannina to die at home.  She had a fall last week and they had great difficulty getting her off the ground.  They feel Hospice has the end of life experts and that she would be best cared for at hospice house.  Bethany Nelson describes her mother having lower extremity pain which she feels is related to neuropathy, and right shoulder pain which is likely bursitis.  We discussed her shortness of breath and chest pain.  As far as functional and nutritional status the patient is sleeping most of the time and eating only bites and sips.  She is unable to stand - per her son in law - she is dead weight.  Hospice and Palliative Care services outpatient were explained and offered.  Bethany Nelson would like to accept [place] services for her mother at [facility] if possible.  Questions and concerns were addressed.  The family was encouraged to call with questions or concerns.    Primary Decision Maker:  NEXT OF KIN daughter Bethany Nelson    SUMMARY OF RECOMMENDATIONS    Will decrease scheduled morphine - it is very important to [place] not to over sedate her mother. Will recommend scheduled tylenol for  - shoulder and lower extremity pain. Low dose morphine PRN and scheduled for shortness of breath and comfort at end of life. Social work aware of request for hospice house.  If [facility] does not have a bed - I would recommend moving the patient to 6N.  Code Status/Advance Care Planning:  DNR   Symptom Management:   Morphine 2.5 mg orally bid, plus morphine 2 mg IV PRN SOB or discomfort  Scheduled tylenol for shoulder and lower extremity pain.  Ativan,  haldol, robinul PRN for anxiety, agitation/delirium, excess secretions respectively.   Psycho-social/Spiritual:  Desire for further Chaplaincy support: yes please  Prognosis:  Hours to days given end stage kidney disease, mixed heart failure currently in exacerbation, COPD on home oxygen, moderate dementia, bed bound, eating sips/bites.    Discharge Planning: Hospice facility      Primary Diagnoses: Present on Admission: . Heart failure with reduced ejection fraction ([place]) . CKD (chronic kidney disease), stage IV ([place]) . Hypertensive heart and chronic kidney disease with heart failure and stage 1 through stage 4 chronic kidney disease, or chronic kidney disease ([place]) . Acute respiratory failure with hypoxia ([place]) . Renal failure   I have reviewed the medical record, interviewed the patient and family, and examined the patient. The following aspects are pertinent.  Past Medical History:  Diagnosis Date  . Acute respiratory failure ([place]) 03/16/2017  . Chronic diastolic congestive heart failure ([place])    a. 02/2016: echo showing normal EF, Grade 3 DD, mild MR, trivial TR  . CKD (chronic kidney disease), stage IV ([place])   . Dementia   . Diabetes mellitus without complication ([place])   . Hypertension   . Pneumonia 03/10/2017   Social History   Socioeconomic History  . Marital status: Widowed    Spouse name: None  . Number of children: None  . Years of education: None  . Highest education level: None  Social Needs  . Financial resource strain: None  . Food insecurity - worry: None  . Food insecurity - inability: None  . Transportation needs - medical: None  . Transportation needs - non-medical: None  Occupational History  . None  Tobacco Use  . Smoking status: Never Smoker  . Smokeless tobacco: Never Used  Substance and Sexual Activity  . Alcohol use: No  . Drug use: No  . Sexual activity: None  Other Topics Concern  . None  Social History Narrative  . None    Family History  Problem Relation Age of Onset  . Esophageal cancer Mother   . Heart disease Neg Hx    Scheduled Meds: . cromolyn  1 drop Both Eyes QID  . ipratropium-albuterol  3 mL Nebulization TID  . morphine CONCENTRATE  5 mg Oral Q8H  . sodium chloride flush  3 mL Intravenous Q12H  . sodium chloride flush  3 mL Intravenous Q12H  . white petrolatum       Continuous Infusions: . sodium chloride    .  sodium chloride     PRN Meds:.sodium chloride, sodium chloride, acetaminophen, albuterol, antiseptic oral rinse, atropine, fluticasone, glycopyrrolate **OR** glycopyrrolate **OR** glycopyrrolate, haloperidol **OR** haloperidol **OR** haloperidol lactate, LORazepam **OR** LORazepam **OR** LORazepam, morphine injection, ondansetron **OR** ondansetron (ZOFRAN) IV, polyvinyl alcohol, sodium chloride flush, sodium chloride flush Allergies  Allergen Reactions  . Zithromax [Azithromycin] Other (See Comments)    Noted red enhancement of vein distal to IV insertion site after several minutes of IV drug infusion- perfect outline of vein branch- no itching or edema   Review of Systems 10 sys  Physical Exam  Elderly obese female, lying in bed daughter at bedside Work of breathing is increased.  Patient denies pain, but then moans.  Vital Signs: BP 131/87 (BP Location: Left Arm)   Pulse 62   Temp 98.2 F (36.8 C) (Oral)   Resp 18   Ht 5' 6"  (1.676 m)   Wt 83.7 kg (184 lb 8.4 oz)   SpO2 99%   BMI 29.78 kg/m  Pain Assessment: No/denies pain     SpO2: SpO2: 99 % O2 Device:SpO2: 99 % O2 Flow Rate: .O2 Flow Rate (L/min): 2 L/min  IO: Intake/output summary:   Intake/Output Summary (Last 24 hours) at 05/27/2017 0925 Last data filed at 05/27/2017 0910 Gross per 24 hour  Intake 276 ml  Output 0 ml  Net 276 ml    LBM:   Baseline Weight: Weight: 83.7 kg (184 lb 8.4 oz) Most recent weight: Weight: 83.7 kg (184 lb 8.4 oz)     Palliative Assessment/Data: 20%     Time In: 8:30  am Time Out: 9:00am Time In:  1:00 pm  Time Out:  2:17 pm Time Total: 107 min. Greater than 50%  of this time was spent counseling and coordinating care related to the above assessment and plan.  Signed by: Florentina Jenny, PA-C Palliative Medicine Pager: [phone]  Please contact Palliative Medicine Team phone at [phone] for questions and concerns.  For individual provider: See Shea Evans

## 2017-05-28 ENCOUNTER — Other Ambulatory Visit: Payer: Self-pay | Admitting: Interventional Cardiology

## 2017-05-28 MED ORDER — ACETAMINOPHEN 160 MG/5ML PO SOLN
650.0000 mg | ORAL | Status: DC | PRN
Start: 2017-05-28 — End: 2017-05-29

## 2017-05-28 MED ORDER — IPRATROPIUM-ALBUTEROL 0.5-2.5 (3) MG/3ML IN SOLN: 3.0000 mL | Freq: Four times a day (QID) | RESPIRATORY_TRACT | Status: DC | PRN

## 2017-05-28 MED ORDER — POTASSIUM CHLORIDE CRYS ER 20 MEQ PO TBCR: 20.0000 meq | EXTENDED_RELEASE_TABLET | Freq: Two times a day (BID) | ORAL | 1 refills | Status: DC

## 2017-05-28 NOTE — Social Work (Addendum)
CSW continuing to follow, made f/u referral to [facility] this morning.  There are still not beds available at [facility]. Doy HutchingIsabel H Conall Vangorder, LCSWA [facility] Clinical Social Work [phone]

## 2017-05-28 NOTE — Progress Notes (Signed)
Nutrition Brief Note  Chart reviewed. Pt now transitioning to comfort care.  No further nutrition interventions warranted at this time.  Please re-consult as needed.   Michaeal Davis A. Shawnya Mayor, RD, LDN, CDE Pager: [phone] After hours Pager: [phone]  

## 2017-05-28 NOTE — Plan of Care (Signed)
  Problem: Coping: Goal: Level of anxiety will decrease Outcome: Progressing   Problem: Pain Managment: Goal: General experience of comfort will improve Outcome: Progressing   

## 2017-05-28 NOTE — Progress Notes (Signed)
Palliative care encounter/progress note  Met with patient, daughter and caregiver at bedside.  Patient sleeping and did not arouse.  As per daughter, patient had a restful last night without complaints.  She was awake this morning and denied pain and did eat some breakfast.  No discomfort issues reported.  This was confirmed with RN.  Patient comfortably resting in bed without distress.  Vital signs reviewed and blood pressures are soft with SBP's in the 80s-90s.  No apneic or agonal respirations noted.  Sleeping and did not arouse.  Assessment and plan:  Adult failure to thrive, multifactorial related to multiple acute on chronic comorbidities complicating advanced age and frail physical status.  Transitioned to full comfort care during this admission and awaiting a bed at [facility].  Discussed with daughter.  Vernell Leep, MD, FACP, [facility]. [facility] Pager [phone]  If 7PM-7AM, please contact night-coverage [email or web address] Password [facility] 05/28/2017, 4:42 PM

## 2017-05-28 NOTE — Progress Notes (Signed)
Patient sleeping comfortably.  She does not respond to my exam.  Daughter Bethany Mccreedy(Barbara) at bedside.  She states her mother had a better night last night and rested.  Aid Bethany Nelson at bedside.    PE Elderly female, eyes closed, non-verbal CV brady no m/r/g resp regular to slightly slow, no w/c   Assessment:  84 yo female actively dying of acute on chronic kidney and heart failure.  Recommendation:  Continue current care.  [facility] as a bed is available.  Note:  Daughter prefers less sedation at EOL if possible, but wants her mother comfortable  Bethany RichardsMarianne Mailey Landstrom, PA-C Palliative Medicine Pager: [phone]   Total time 25 min.

## 2017-05-29 MED ORDER — LORAZEPAM 2 MG/ML PO CONC: 0.5000 mg | Freq: Three times a day (TID) | ORAL | Status: AC | PRN

## 2017-05-29 MED ORDER — MORPHINE SULFATE (CONCENTRATE) 10 MG/0.5ML PO SOLN: 2.5000 mg | Freq: Four times a day (QID) | ORAL | Status: AC | PRN

## 2017-05-29 MED ORDER — POLYVINYL ALCOHOL 1.4 % OP SOLN: 1.0000 [drp] | Freq: Four times a day (QID) | OPHTHALMIC | Status: AC | PRN

## 2017-05-29 MED ORDER — ATROPINE SULFATE 1 % OP SOLN: 4.0000 [drp] | OPHTHALMIC | Status: AC | PRN

## 2017-05-29 NOTE — Progress Notes (Signed)
Report called to [facility]. Await transport by [facility].

## 2017-05-29 NOTE — Social Work (Signed)
CSW notified by [facility] liaison that pt will be able to transfer today.   CSW will complete necessary transport papers and page MD for discharge summary.  Doy HutchingIsabel H Shakyla Nelson, LCSWA [facility] Clinical Social Work [phone]

## 2017-05-29 NOTE — Social Work (Signed)
Clinical Social Worker facilitated patient discharge including contacting patient family and facility to confirm patient discharge plans.  Clinical information faxed to facility and family agreeable with plan.  CSW arranged [facility].   See [facility] note for report number,   Clinical Social Worker will sign off for now as social work intervention is no longer needed. Please consult us again if new need arises.  Bethany HutchingIsabel H  Nelson, LCSWA Clinical Social Worker

## 2017-05-29 NOTE — Progress Notes (Signed)
[facility] Liaison: RN visit  Received request from Westley Hummer, [place] for family interest in [facility] with request for transfer today. Chart reviewed and patient approved for [facility]. Met with [name], daughter to confirm interest and explain services. Family agreeable to transfer today. CSW aware. Registration paperwork completed. Dr. Orpah Melter to assume care per family request. Please fax discharge summary to [phone]. RN please call report to [phone]. Please arrange transport for patient to arrive as soon as possible.  Thank you for this referral,  Farrel Gordon, RN, [facility] Liaison [place] are on AMION

## 2017-05-29 NOTE — Discharge Summary (Addendum)
Physician Discharge Summary  TENNESSEE HANLON IRS:[id number] DOB: 1926/11/08  PCP: Maryland Pink, MD  Admit date: 05/26/2017 Discharge date: 05/29/2017  Recommendations for Outpatient Follow-up:  1. MD at [facility] for ongoing hospice care.  Home Health: N/A Equipment/Devices: N/A    Discharge Condition: Guarded with expected decline and death.  Prognosis <2 weeks. CODE STATUS: DNR Diet recommendation: Regular diet.  Discharge Diagnoses:  Active Problems:   Heart failure with reduced ejection fraction (HCC)   Type 2 diabetes mellitus with stage 3 chronic kidney disease, with long-term current use of insulin (HCC)   CKD (chronic kidney disease), stage IV (HCC)   Hypertensive heart and chronic kidney disease with heart failure and stage 1 through stage 4 chronic kidney disease, or chronic kidney disease (HCC)   Acute respiratory failure with hypoxia (HCC)   Acute on chronic combined systolic and diastolic congestive heart failure (HCC)   Renal failure   Palliative care encounter   Comfort measures only status   Brief Summary: 84 year old female with PMH of IDDM, stage IV chronic kidney disease with baseline creatinine of about mid 2 range, COPD on home oxygen, chronic combined systolic and diastolic CHF, EF 00-93%, severe MR for which she is not a candidate for surgery, recent admissions twice in January and once in November for decompensated CHF, she had gone to rehab after last discharge and subsequently home, presented with dyspnea and chest pain.  In the ED, she was noted to be in respiratory distress, treated with IV Solu-Medrol, nebulizers and aspirin which helped her dyspnea but she became hypotensive and noted to have worsening renal insufficiency with creatinine of 9.  EDP and admitting physician discussed with patient's daughter and patient was admitted for full comfort care.  Palliative team consulted.   Assessment & Plan:   Acute on chronic respiratory failure with  hypoxia: Acute respiratory failure likely due to decompensated CHF and COPD exacerbation.  Treated in ED with IV Solu-Medrol and bronchodilators.  Dyspnea improved.  Now transitioned to full comfort care.  Palliative care consulted.  They met with family.  All medications unrelated to comfort were discontinued.  Patient has remained comfortable without distress.  She is being discharged to residential hospice for end-of-life care.  Acute on chronic combined systolic and diastolic CHF/moderate to severe pulmonary hypertension/severe mitral regurgitation: Unable to diurese on admission due to hypotension/cardiogenic shock.  Now transitioned to full comfort care.  COPD exacerbation: No clinical bronchospasm at this time.  Hypotension/possible cardiogenic shock: Full comfort care.  Acute on stage IV chronic kidney disease/metabolic acidosis: Creatinine went up from 2.14 on 1/12-9.03 on this admission.  May be due to cardiorenal syndrome/cardiogenic shock versus due to cardiac medications/diuretics.  Not candidate for any aggressive interventions/HD.  Transitioned to full comfort care.  Chest pain/elevated troponin: As needed Roxanol.  Patient has not received much of morphine or Ativan but has remained comfortable.  Adult failure to thrive: Multifactorial due to advanced age, frail physical status and multiple severe significant comorbidities.  Palliative care consulted.  Discharging to residential hospice today.     Consultants:  Palliative care medicine  Procedures:  None    Discharge Instructions  Discharge Instructions    Call MD for:  difficulty breathing, headache or visual disturbances   Complete by:  As directed    Call MD for:  persistant nausea and vomiting   Complete by:  As directed    Call MD for:  severe uncontrolled pain   Complete by:  As directed  Call MD for:  temperature >100.4   Complete by:  As directed    Diet general   Complete by:  As directed     Increase activity slowly   Complete by:  As directed        Medication List    STOP taking these medications   acetaminophen 500 MG tablet Commonly known as:  TYLENOL   aspirin EC 81 MG tablet   atorvastatin 40 MG tablet Commonly known as:  LIPITOR   Biotin 1000 MCG Chew   cetirizine 10 MG tablet Commonly known as:  ZYRTEC   cromolyn 4 % ophthalmic solution Commonly known as:  OPTICROM   donepezil 5 MG tablet Commonly known as:  ARICEPT   dorzolamide-timolol 22.3-6.8 MG/ML ophthalmic solution Commonly known as:  COSOPT   fluticasone 50 MCG/ACT nasal spray Commonly known as:  FLONASE   furosemide 80 MG tablet Commonly known as:  LASIX   ipratropium-albuterol 0.5-2.5 (3) MG/3ML Soln Commonly known as:  DUONEB   LANTUS SOLOSTAR 100 UNIT/ML Solostar Pen Generic drug:  Insulin Glargine   latanoprost 0.005 % ophthalmic solution Commonly known as:  XALATAN   LEXAPRO 10 MG tablet Generic drug:  escitalopram   metoprolol tartrate 25 MG tablet Commonly known as:  LOPRESSOR   potassium chloride SA 20 MEQ tablet Commonly known as:  K-DUR,KLOR-CON   PROVENTIL HFA 108 (90 Base) MCG/ACT inhaler Generic drug:  albuterol     TAKE these medications   atropine 1 % ophthalmic solution Place 4 drops under the tongue every 4 (four) hours as needed (excessive secretions).   LORazepam 2 MG/ML concentrated solution Commonly known as:  ATIVAN Place 0.3 mLs (0.6 mg total) under the tongue every 8 (eight) hours as needed for anxiety.   morphine CONCENTRATE 10 MG/0.5ML Soln concentrated solution Place 0.13 mLs (2.6 mg total) under the tongue every 6 (six) hours as needed for moderate pain, severe pain or shortness of breath.   polyvinyl alcohol 1.4 % ophthalmic solution Commonly known as:  LIQUIFILM TEARS Place 1 drop into both eyes 4 (four) times daily as needed for dry eyes.      Follow-up Information    MD at [facility] Follow up.   Why:  FOllow up re ongoing  hospice care.       Maryland Pink, MD Follow up.   Specialty:  Family Medicine Why:  call as needed. Contact information: [address] [place] [id number] [phone]          Allergies  Allergen Reactions  . Zithromax [Azithromycin] Other (See Comments)    Noted red enhancement of vein distal to IV insertion site after several minutes of IV drug infusion- perfect outline of vein branch- no itching or edema      Procedures/Studies: Dg Chest Port 1 View  Result Date: 05/26/2017 CLINICAL DATA:  Shortness of breath EXAM: PORTABLE CHEST 1 VIEW COMPARISON:  03/15/2017 FINDINGS: Cardiomegaly with vascular congestion and mild diffuse interstitial opacity suspect for edema. Airspace disease at the left lung base. Probable left pleural effusion. Aortic atherosclerosis. No pneumothorax. Mitral annular calcification. IMPRESSION: 1. Cardiomegaly with vascular congestion and mild diffuse interstitial opacities suspicious for pulmonary edema 2. Probable left pleural effusion. Worsening airspace disease at the left lung base may reflect atelectasis or pneumonia. Electronically Signed   By: Donavan Foil M.D.   On: 05/26/2017 15:04      Subjective: Patient resting comfortably and did not appear in any distress.  Discussed with  patient's son-in-law at bedside who states that she had an uneventful night without distress.  Discharge Exam:  Vitals:   05/28/17 0500 05/28/17 0604 05/28/17 0810 05/28/17 1345  BP:  (!) 106/44  (!) 96/50  Pulse: 62   60  Resp: 16     Temp: 98.4 F (36.9 C) 98.2 F (36.8 C)  97.8 F (36.6 C)  TempSrc: Oral Axillary  Axillary  SpO2: 94%  100% 100%  Weight:      Height:        General exam: Elderly female, moderately built and nourished, lying comfortably supine in bed.  Does not appear in any distress. Respiratory system: Clear to auscultation anteriorly, diminished breath sounds in the bases with occasional basal crackles.  Respiratory effort normal. Cardiovascular system: S1 & S2 heard, RRR. No JVD, murmurs, rubs, gallops or clicks. No pedal edema. Gastrointestinal system: Abdomen is nondistended, soft and nontender. No organomegaly or masses felt. Normal bowel sounds heard. Central nervous system: Patient was sleeping and did not arouse her to examine.  Extremities: No clubbing, cyanosis or edema. Skin: Not examined. Psychiatry: Not examined.      The results of significant diagnostics from this hospitalization (including imaging, microbiology, ancillary and laboratory) are listed below for reference.      Labs: CBC: Recent Labs  Lab 05/26/17 1423  WBC 9.7  NEUTROABS 8.6*  HGB 9.3*  HCT 30.5*  MCV 100.0  PLT 320*   Basic Metabolic Panel: Recent Labs  Lab 05/26/17 1423  NA 139  K 5.0  CL 102  CO2 16*  GLUCOSE 166*  BUN 148*  CREATININE 9.03*  CALCIUM 8.1*   Liver Function Tests: Recent Labs  Lab 05/26/17 1423  AST 19  ALT 11*  ALKPHOS 31*  BILITOT 0.6  PROT 6.8  ALBUMIN 3.1*   BNP (last 3 results) Recent Labs    03/09/17 1029 03/15/17 1320 05/26/17 1423  BNP 4,093.2* 4,468.1* >4,500.0*   Cardiac Enzymes: Recent Labs  Lab 05/26/17 1423  TROPONINI 0.06*   I discussed with patient's son-in-law at bedside, updated care and answered questions.  I also personally discussed with patient's primary Cardiologist 2 days back and updated him on patient's ongoing care and treatment plan and he agreed with same.    Time coordinating discharge: Less than 30 minutes  SIGNED:  Vernell Leep, MD, FACP, [facility]. [facility] Pager [phone]  If 7PM-7AM, please contact night-coverage [email or web address] Password [email or web address] 05/29/2017, 11:01 AM

## 2017-05-29 NOTE — Discharge Instructions (Signed)
Hospice Introduction Hospice is a service that is designed to provide people who are terminally ill and their families with medical, spiritual, and psychological support. Its aim is to improve your quality of life by keeping you as alert and comfortable as possible. Who will be my providers when I begin hospice care? Hospice teams often include:  A nurse.  A doctor. The hospice doctor will be available for your care, but you can bring your regular doctor or nurse practitioner.  Social workers.  Religious leaders (such as a Clinical biochemist).  Trained volunteers.  What roles will providers play in my care? Hospice is performed by a team of health care professionals and volunteers who:  Help keep you comfortable: ? Hospice can be provided in your home or in a homelike setting. ? The hospice staff works with your family and friends to help meet your needs. ? You will enjoy the support of loved ones by receiving much of your basic care from family and friends.  Provide pain relief and manage your symptoms. The staff supply all necessary medicines and equipment.  Provide companionship when you are alone.  Allow you and your family to rest. They may do light housekeeping, prepare meals, and run errands.  Provide counseling. They will make sure your emotional, spiritual, and social needs and those of your family are being met.  Provide spiritual care: ? Spiritual care will be individualized to meet your needs and your family's needs. ? Spiritual care may involve:  Helping you look at what death means to you.  Helping you say goodbye to your family and friends.  Performing a specific religious ceremony or ritual.  When should hospice care begin? Most people who use hospice are believed to have fewer than 6 months to live.  Your family and health care providers can help you decide when hospice services should begin.  If your condition improves, you may discontinue the program.  What  should I consider before selecting a program? Most hospice programs are run by nonprofit, independent organizations. Some are affiliated with hospitals, nursing homes, or home health care agencies. Hospice programs can take place in the home or at a hospice center, hospital, or skilled nursing facility. When choosing a hospice program, ask the following questions:  What services are available to me?  What services will be offered to my loved ones?  How involved will my loved ones be?  How involved will my health care provider be?  Who makes up the hospice care team? How are they trained or screened?  How will my pain and symptoms be managed?  If my circumstances change, can the services be provided in a different setting, such as my home or in the hospital?  Is the program reviewed and licensed by the state or certified in some other way?  Where can I learn more about hospice? You can learn about existing hospice programs in your area from your health care providers. You can also read more about hospice online. The websites of the following organizations contain helpful information:  The [facility] and Palliative Care Organization [facility]).  The Hospice Association of America ([place]).  The [place].  The American Cancer Society (ACS).  Hospice Net.  This information is not intended to replace advice given to you by your health care provider. Make sure you discuss any questions you have with your health care provider. Document Released: 06/13/2003 Document Revised: 10/11/2015 Document Reviewed: 01/04/2013 Elsevier Interactive Patient Education  2017 [employer].

## 2017-05-29 NOTE — Progress Notes (Signed)
Patient appears comfortable.  Opens her eyes to my touch and grunts but does not speak.   [name] is changing. Has mildly increased work of breathing.  Patient has received no PRNs.  Daughter is very cautious about her mother being over sedated.  Talked with patient's aid Bethany Nelson(Patricia) at bedside.  Patient has been quiet.    Called daughter Bethany Nelson, but was only able to leave a voice mail.  [facility]  PMT will sign off.    Bethany RichardsMarianne Koby Pickup, PA-C Palliative Medicine Pager: [phone]  Total time 15 min.

## 2017-06-08 DEATH — deceased

## 2017-06-09 DIAGNOSIS — Z515 Encounter for palliative care: Secondary | ICD-10-CM

## 2018-02-03 IMAGING — US US RENAL
1 series · 14 of 19 positions shown · non-contrast
Comparison: None.

CLINICAL DATA: Decreased urine output.

EXAM:
RENAL / URINARY TRACT ULTRASOUND COMPLETE

[Series 1: us renal · 0.23mm/px · 14 of 19 slices shown]
[im 1/19]
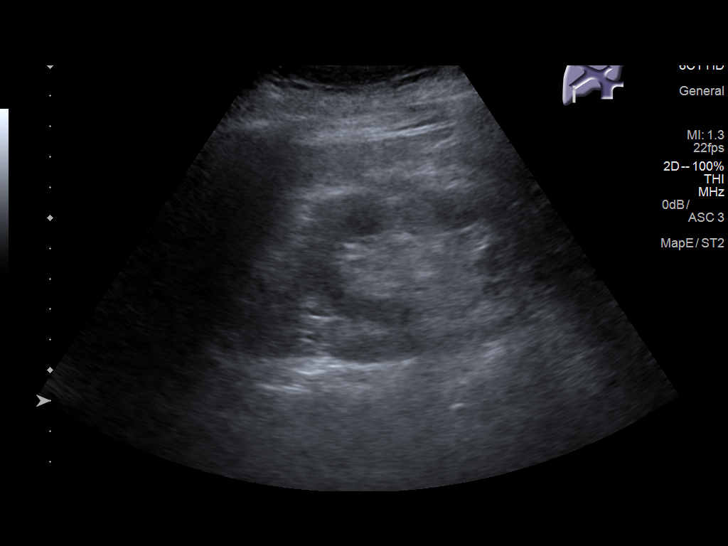
[im 3/19]
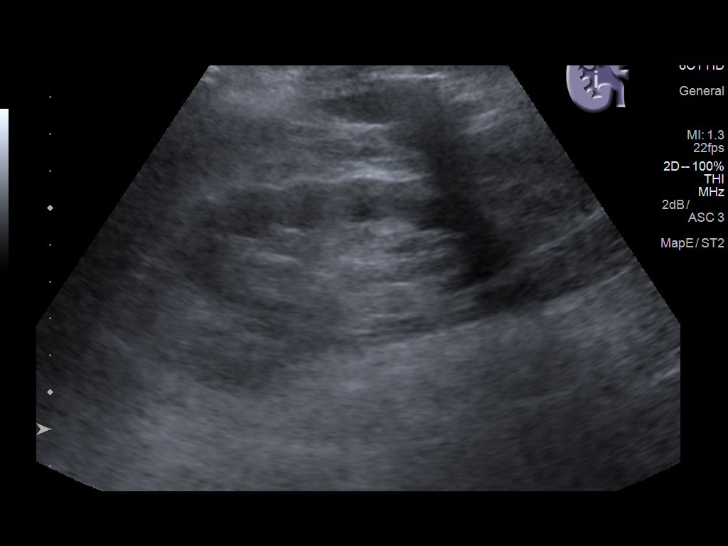
[im 4/19]
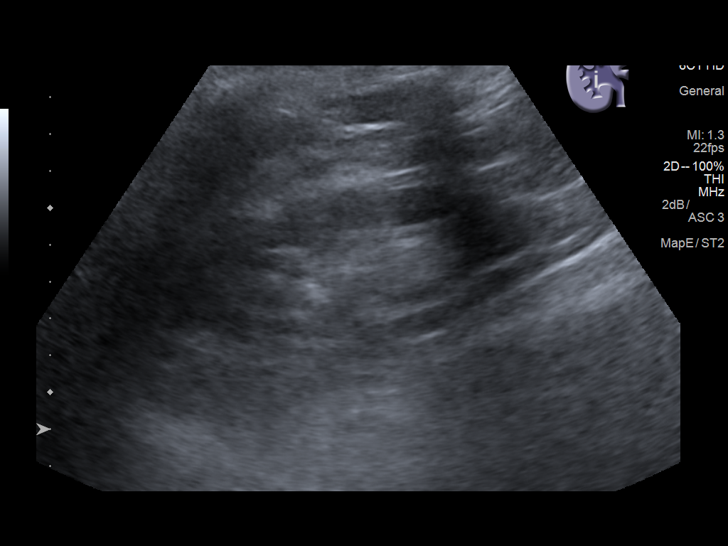
[im 5/19]
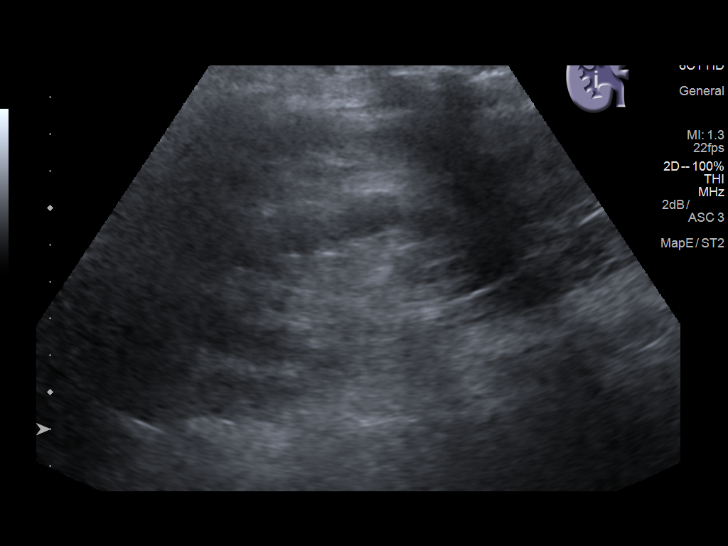
[im 7/19]
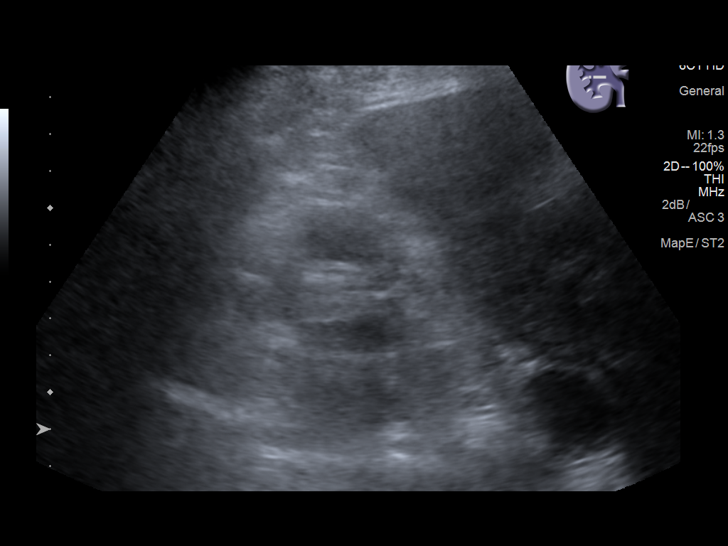
[im 8/19]
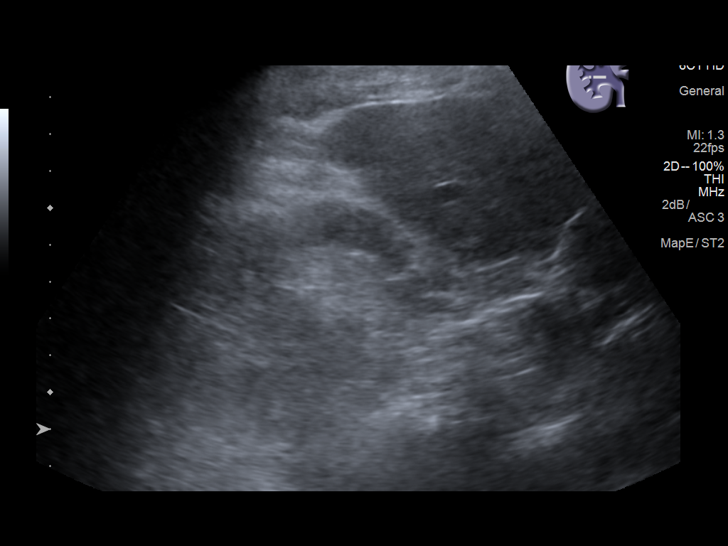
[im 9/19]
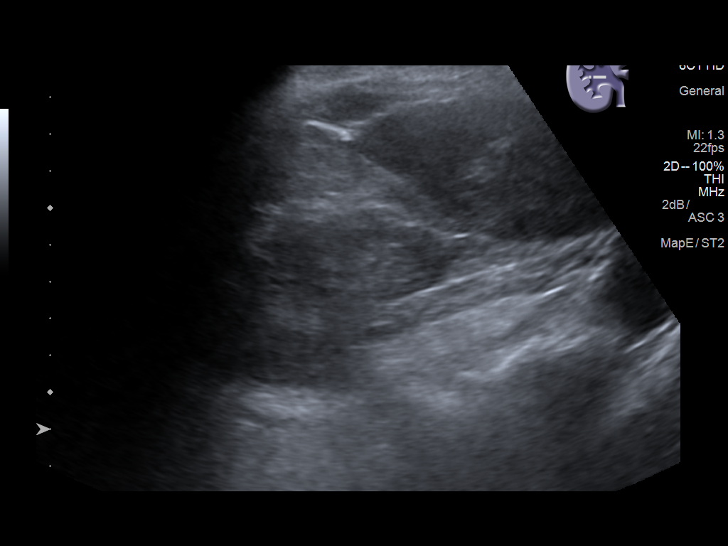
[im 11/19]
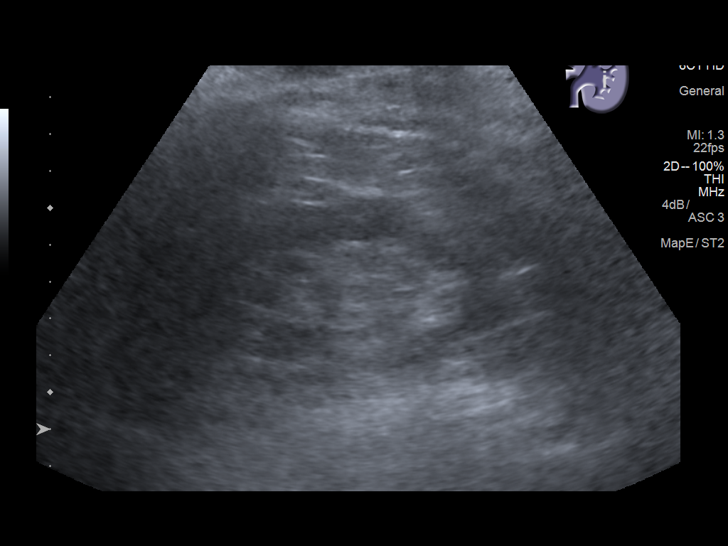
[im 12/19]
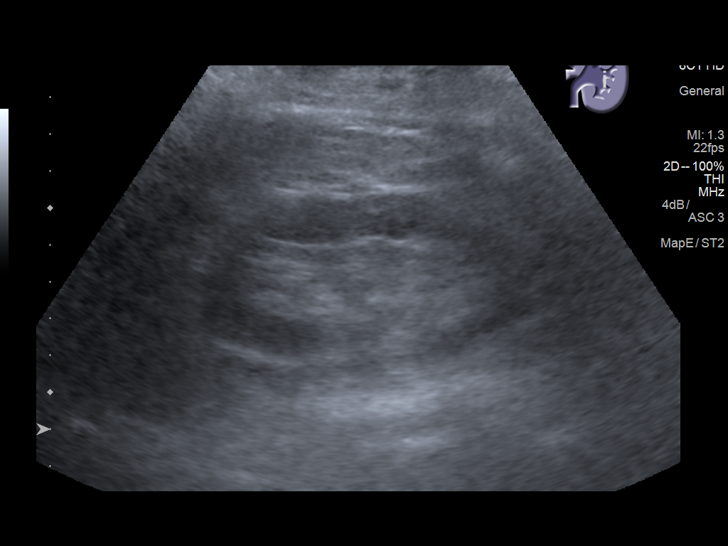
[im 13/19]
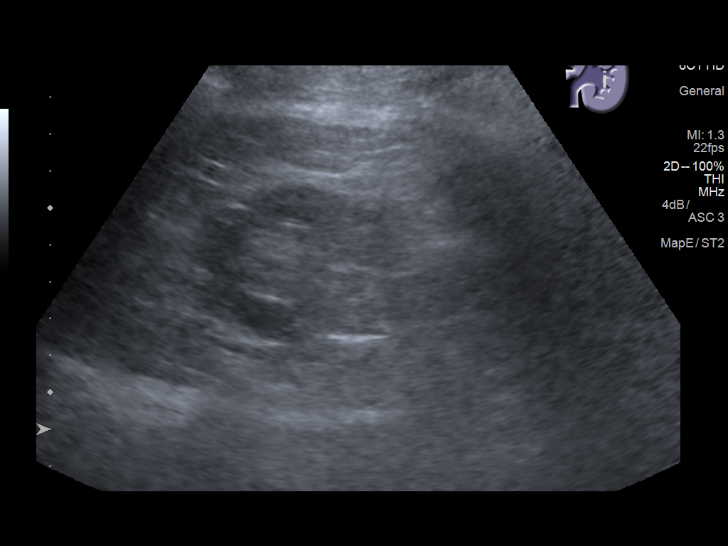
[im 15/19]
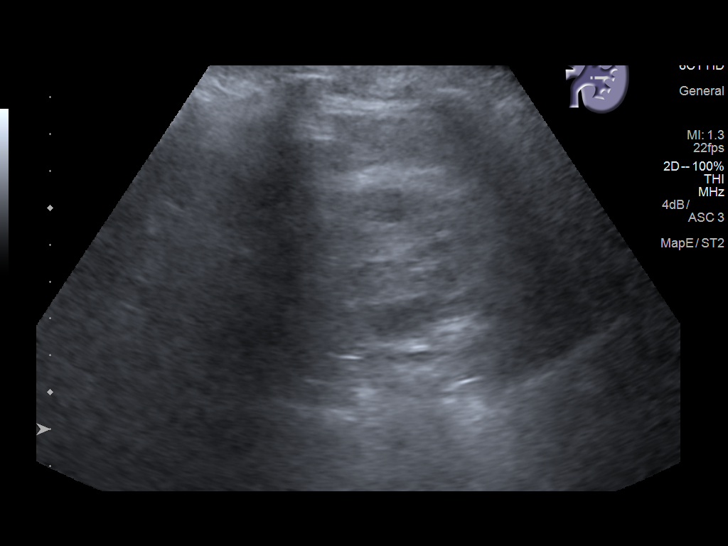
[im 16/19]
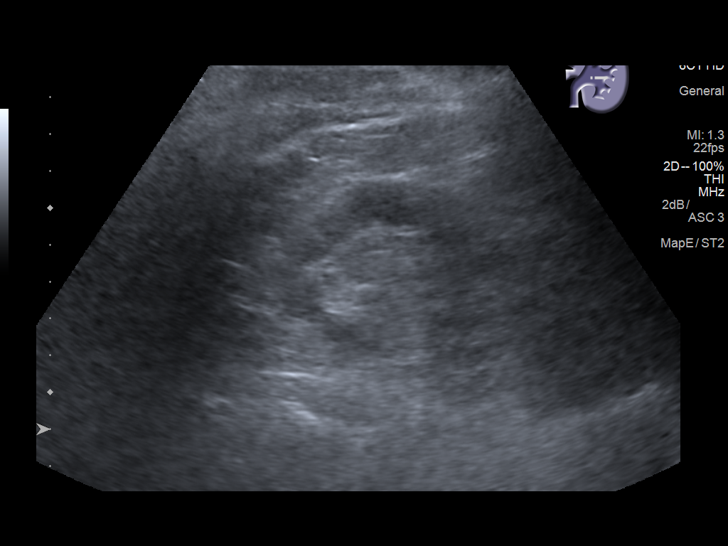
[im 17/19]
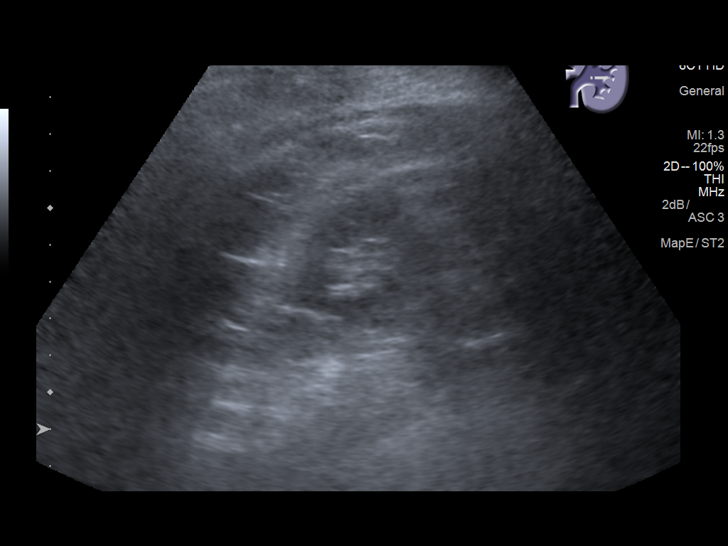
[im 19/19]
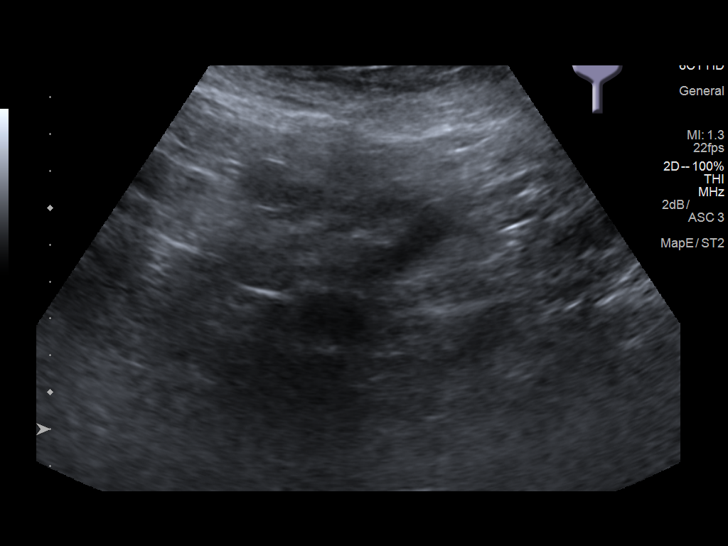

[14 of 19 positions shown; findings below may reference images not displayed]

FINDINGS: Technically challenging and limited exam due to body habitus and
inability to breath hold.

Right Kidney:

Length: 9.7 cm. Thinning of renal parenchyma with mild increased
renal echogenicity. No mass or hydronephrosis visualized.

Left Kidney:

Length: 9.7 cm. Thinning of renal parenchyma with mild increased
renal echogenicity. No mass or hydronephrosis visualized.

Bladder:

Decompressed and not evaluated.
IMPRESSION: Thinning of bilateral renal parenchyma with increased renal
echogenicity suggesting chronic medical renal disease. No
obstructive uropathy.

Decompressed urinary bladder.
